# Patient Record
Sex: Female | Born: 1960
Health system: Southern US, Community
[De-identification: ages and names within clinical notes are randomized; demographics above are authoritative.]

## PROBLEM LIST (undated history)

## (undated) DIAGNOSIS — S149XXA Injury of unspecified nerves of neck, initial encounter: Secondary | ICD-10-CM

## (undated) DIAGNOSIS — N200 Calculus of kidney: Secondary | ICD-10-CM

## (undated) DIAGNOSIS — E7800 Pure hypercholesterolemia, unspecified: Secondary | ICD-10-CM

## (undated) DIAGNOSIS — T7840XA Allergy, unspecified, initial encounter: Secondary | ICD-10-CM

## (undated) DIAGNOSIS — M549 Dorsalgia, unspecified: Secondary | ICD-10-CM

## (undated) DIAGNOSIS — I10 Essential (primary) hypertension: Secondary | ICD-10-CM

## (undated) DIAGNOSIS — G589 Mononeuropathy, unspecified: Secondary | ICD-10-CM

## (undated) DIAGNOSIS — D649 Anemia, unspecified: Secondary | ICD-10-CM

## (undated) DIAGNOSIS — F17200 Nicotine dependence, unspecified, uncomplicated: Secondary | ICD-10-CM

## (undated) DIAGNOSIS — M1990 Unspecified osteoarthritis, unspecified site: Secondary | ICD-10-CM

## (undated) DIAGNOSIS — M47816 Spondylosis without myelopathy or radiculopathy, lumbar region: Secondary | ICD-10-CM

## (undated) DIAGNOSIS — G5600 Carpal tunnel syndrome, unspecified upper limb: Secondary | ICD-10-CM

## (undated) HISTORY — DX: Carpal tunnel syndrome, unspecified upper limb: G56.00

## (undated) HISTORY — DX: Pure hypercholesterolemia, unspecified: E78.00

## (undated) HISTORY — DX: Allergy, unspecified, initial encounter: T78.40XA

## (undated) HISTORY — PX: COLONOSCOPY: SHX174

## (undated) HISTORY — DX: Anemia, unspecified: D64.9

## (undated) HISTORY — PX: APPENDECTOMY: SHX54

## (undated) HISTORY — PX: ROTATOR CUFF REPAIR: SHX139

## (undated) HISTORY — PX: POLYPECTOMY: SHX149

---

## 2000-04-15 ENCOUNTER — Other Ambulatory Visit: Admission: RE | Admit: 2000-04-15 | Discharge: 2000-04-15 | Payer: Self-pay | Admitting: *Deleted

## 2000-05-22 ENCOUNTER — Encounter (INDEPENDENT_AMBULATORY_CARE_PROVIDER_SITE_OTHER): Payer: Self-pay | Admitting: Specialist

## 2000-05-22 ENCOUNTER — Other Ambulatory Visit: Admission: RE | Admit: 2000-05-22 | Discharge: 2000-05-22 | Payer: Self-pay | Admitting: *Deleted

## 2000-05-29 ENCOUNTER — Emergency Department (HOSPITAL_COMMUNITY): Admission: EM | Admit: 2000-05-29 | Discharge: 2000-05-29 | Payer: Self-pay | Admitting: Emergency Medicine

## 2000-06-01 ENCOUNTER — Encounter: Admission: RE | Admit: 2000-06-01 | Discharge: 2000-06-01 | Payer: Self-pay | Admitting: Family Medicine

## 2000-06-01 ENCOUNTER — Encounter: Payer: Self-pay | Admitting: Family Medicine

## 2000-07-15 ENCOUNTER — Encounter: Admission: RE | Admit: 2000-07-15 | Discharge: 2000-10-13 | Payer: Self-pay | Admitting: Neurology

## 2000-07-24 ENCOUNTER — Observation Stay (HOSPITAL_COMMUNITY): Admission: RE | Admit: 2000-07-24 | Discharge: 2000-07-25 | Payer: Self-pay | Admitting: Neurosurgery

## 2000-07-24 ENCOUNTER — Encounter: Payer: Self-pay | Admitting: Neurosurgery

## 2000-11-20 ENCOUNTER — Encounter: Payer: Self-pay | Admitting: Orthopedic Surgery

## 2000-11-20 ENCOUNTER — Ambulatory Visit (HOSPITAL_COMMUNITY): Admission: RE | Admit: 2000-11-20 | Discharge: 2000-11-20 | Payer: Self-pay | Admitting: Orthopedic Surgery

## 2002-07-27 ENCOUNTER — Emergency Department (HOSPITAL_COMMUNITY): Admission: EM | Admit: 2002-07-27 | Discharge: 2002-07-27 | Payer: Self-pay | Admitting: Emergency Medicine

## 2003-10-15 ENCOUNTER — Emergency Department (HOSPITAL_COMMUNITY): Admission: EM | Admit: 2003-10-15 | Discharge: 2003-10-15 | Payer: Self-pay | Admitting: Emergency Medicine

## 2003-10-19 ENCOUNTER — Other Ambulatory Visit: Admission: RE | Admit: 2003-10-19 | Discharge: 2003-10-19 | Payer: Self-pay | Admitting: Obstetrics and Gynecology

## 2003-10-24 ENCOUNTER — Encounter: Admission: RE | Admit: 2003-10-24 | Discharge: 2003-10-24 | Payer: Self-pay | Admitting: Obstetrics and Gynecology

## 2003-12-08 ENCOUNTER — Encounter (INDEPENDENT_AMBULATORY_CARE_PROVIDER_SITE_OTHER): Payer: Self-pay | Admitting: *Deleted

## 2003-12-08 ENCOUNTER — Ambulatory Visit (HOSPITAL_COMMUNITY): Admission: RE | Admit: 2003-12-08 | Discharge: 2003-12-08 | Payer: Self-pay | Admitting: Obstetrics and Gynecology

## 2007-01-10 ENCOUNTER — Emergency Department (HOSPITAL_COMMUNITY): Admission: EM | Admit: 2007-01-10 | Discharge: 2007-01-10 | Payer: Self-pay | Admitting: Emergency Medicine

## 2008-09-19 ENCOUNTER — Emergency Department (HOSPITAL_COMMUNITY): Admission: EM | Admit: 2008-09-19 | Discharge: 2008-09-19 | Payer: Self-pay | Admitting: Emergency Medicine

## 2009-10-16 ENCOUNTER — Emergency Department (HOSPITAL_COMMUNITY): Admission: EM | Admit: 2009-10-16 | Discharge: 2009-10-16 | Payer: Self-pay | Admitting: Emergency Medicine

## 2010-06-21 NOTE — Op Note (Signed)
[facility]  Patient:    MARIMAR, SUBER Visit Number: [id number] MRN: [id number]          Service Type: DSU Location: DAY Attending Physician:  Cain Sieve Proc. Date: 11/20/00 Admit Date:  11/20/2000                             Operative Report  PREOPERATIVE DIAGNOSES: 1. 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 2. 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 3. Near full-thickness tear of the rotator cuff with a tear found to    constitute adduction 40% of the thickness of the rotator cuff tendon.  PROCEDURE: 1. Right shoulder glenohumeral joint diagnostic arthroscopy. 2. Debridement of near full-thickness rotator cuff tear and conversion to a    full-thickness rotator cuff tear. 3. Arthroscopic subacromial decompression and bursectomy. 4. Arthroscopic distal clavicle resection. 5. Arthroscopic rotator cuff repair.  SURGEON:  Vania Rea. Supple, M.D.  ANESTHESIA:  General endotracheal as well as a preoperative intrascalene block.  ESTIMATED BLOOD LOSS:  Minimal.  DRAINS:  None.  HISTORY:  Ms. Kaigler is a 50 year old female, who has had persistent difficulties with right shoulder pain as well as loss of motion and weakness with a clinical examination showing markedly positive impingement sign and MRI scan showing hypertrophic arthropathy of the [facility] joint as well as marked tendinosis of the rotator cuff and an apparent partial-thickenss tear.  Due to her ongoing pain and functional limitations, she is brought to the operating room at this time for the above-outlined procedure.  Preoperatively she was counseled on treatment options as well as risks versus benefits thereof.  Possible complications of bleeding, infection, neurovascular injury, persistence of pain, loss of motion, recurrence of rotator cuff tear, and possible need for additional surgery were reviewed. She understands and accepts  and agrees with our plan.  DESCRIPTION OF PROCEDURE:  After undergoing routine preoperative evaluation, the patient had an intrascalene block placed in the holding area by the anesthesia department.  She received 1 g of IV cephalosporin prophylactically. She was placed supine on the operating table and underwent smooth induction of general endotracheal anesthesia.  She was turned to the left lateral decubitus position on the beanbag and appropriately padded and protected.  The right arm was suspended to 70/30 position with 15 pounds of traction.  Examination under anesthesia showed no obvious glenohumeral instability.  Right shoulder girdle region was sterilely prepped and draped in the standard fashion.  The standard posterior portal was established, and the anterior portal was established under direct visualization.  The glenohumeral articular surfaces were in good condition.  There was some very minimal fraying of the labrum which was gently debrided.  The biceps was probed and noted to be well-attached to the superior labrum, and there was no evidence of fraying.  There was evidence for significant partial thickness tear of the articular side of the rotator cuff just posterior to the biceps tendon.  The was aggressively debrided with a shaver to healthy-appearing tissue.  It did appear as though the remaining fibers did have some longitudinal rents through them, and there did appear to be a significant defect in the rotator cuff at this level.  A tag suture was passed of 0 PDS for further evaluation on the bursal side of the rotator cuff at this area.  We completed the inspection of the glenohumeral joint, and this showed no obvious additional pathology.  Fluid and instruments were  then removed and with the arm dropped down to 30 degrees of abduction and the arthroscope introduced into the subacromial space through the posterior portal and direct lateral portal was established in the  subacromial space.  Abundant, proliferative bursal tissue was excised with a combination of the shaver and the Arthrex wand.  The wand was then used to remove the periosteum from the undersurface of the anterior half of the acromion.  Bur was then introduced and used to perform a subacromial decompression, creating a type 4 morphology.  The CA ligament was divided and resected distally with the Arthrex wand used for hemostasis.  A portal was then established directly anterior to the distal clavicle, and a distal clavicle resection was then performed with the bur. Care was taken to make sure that the entire circumference of the distal clavicle could be visualized to ensure adequate removal of bone.  We then carefully inspected the bursal surface of the rotator cuff, and this showed that the area where the tag suture had been placed, showed significant attenuation and thinning on the bursal side, and it was possible to pass a probe completely through this defect.  We decided to proceed with a repair of this region.  The Arthrex wand was then used to remove the residual remnant of the rotator cuff at the greater tuberosity and the adjacent soft tissue.  A bur was introduced and used to create a cancellous bony bed adjacent to the rotator cuff tear.  Through a stab wound off the lateral margin of the acromion, we passed the punch and tap for the Arthrex Bio-Anchor corkscrew suture.  The suture anchor was then placed at the appropriate depth and tension, showing good stability.  The free limbs of the suture anchor were then passed through the free margin rotator cuff, utilizing the Biotek suture retriever.  These parallel limb sutures were then tied, utilizing horizontal mattress suture technique with a sliding arthroscopic knot followed by multiple overhand throws and alternating posts.  This brought the free margin of the rotator cuff down in excellent apposition against the exposed cancellous  bony bed.  We then completed inspection and debridement of the subacromial space.  Fluid and instruments were then all removed.  The portals  were closed with Steri-Strips.  The 4 x 4s and ABDs were then taped about the shoulder.  Her arm was placed in a shoulder immobilizer.  The patient was rolled supine, extubated, and taken to the recovery room in stable condition. Attending Physician:  Cain Sieve DD:  11/20/00 TD:  11/21/00 Job: [id number]

## 2010-06-21 NOTE — H&P (Signed)
[place]  Patient:    Kimberly Sosa, Kimberly Sosa                   MRN: [id number] Adm. Date:  [id number] Attending:  Danella Penton                         History and Physical  HISTORY OF PRESENT ILLNESS:  Kimberly Sosa is a lady who was seen by me in my office a few days ago because of neck pain radiating down to right lower extremity for almost two months.  The pain is getting worse and she has been unable to work.  She is having numbness and tingling which involved the thumb and the hand.  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  She denies any pain over the left upper extremity.  She was sent to [place] after she had an MRI.  PAST MEDICAL HISTORY:  Negative.  ALLERGIES:  TETRACYCLINE.  SOCIAL HISTORY:  She smokes half a pack a day.  She drinks socially.  FAMILY HISTORY:  Mother is 28 with high blood pressure and diabetes.  Father has heart problem.  REVIEW OF SYSTEMS:  Only positive for neck and upper extremity pain.  PHYSICAL EXAMINATION:  HEENT:  Normal.  NECK:  She is able to flex but extensive lateralization produces pain that goes to the right shoulder.  LUNGS:  Clear.  HEART:  Heart sounds normal.  ABDOMEN:  Normal.  EXTREMITIES:  Normal pulses.  NEUROLOGIC:  Mental status normal.  Cranial nerves normal.  Strength is normal except in the right upper extremity where I can break easily to divide biceps and the right wrist extensor.  Sensation normal, although she complains of tingling in the thumb and index finger of the right hand.  Reflexes symmetrical with absent on the right biceps.  Coordination and gait normal.  LABORATORY:  The MRI was seen and I believe this lady had a herniated disk in the foramina at the level of C5-6 on the right side.  CLINICAL IMPRESSION:  Right C5-6 herniated disk.  RECOMMENDATION:  The patient is being  admitted for a posterior approach.  We are going to attempt to use Metrix System to prevent the possibility of pain.  If that is impossible because of her size, then we have to go standard through the midline.  The surgery was fully explained and the risks are emboli, infection, worsening of pain, need for further surgery which might require anterior procedure. DD:  07/24/00 TD:  07/24/00 Job: [id number]

## 2010-06-21 NOTE — Op Note (Signed)
[place]  Patient:    Kimberly Sosa, Kimberly Sosa                   MRN: [id number] Proc. Date: 07/24/00 Adm. Date:  [id number] Disc. Date: [id number] Attending:  Danella Penton                           Operative Report  PREOPERATIVE DIAGNOSIS:  Right C5-6 herniated disk, foraminal.  POSTOPERATIVE DIAGNOSIS:  Right C5-6 herniated disk, foraminal.  PROCEDURE: 1. Right C5, C6 foraminotomies, removal of free fragment. 2. MetRX system. 3. Microscope.  SURGEON:  Tanya Nones. Jeral Fruit, M.D.  ASSISTANTMena Goes. Franky Macho, M.D.  CLINICAL HISTORY:  The patient had been complaining of neck pain radiating to the right upper extremity.  She had weakness of the right biceps.  MRI showed that she has a foraminal herniated disk.  Because there was no displacement of the spinal cord, a decision to go through the posterior approach was made.  Tp knew about the risks, such as were explained in the history and physical.  DESCRIPTION OF PROCEDURE:  The patient was taken to the OR and after intubation, the three pins were applied to the head.  She was positioned in sitting position with 5 degrees forward.  The neck was prepped with Betadine. Then we brought the C-arm and with a needle, we localized the interspace at 5-6.  Then we went about an inch away from the midline.  We made an incision and using dilators, went through this opening until we were able to introduce the retractor.  Indeed, we were at the level of 5-6 by C-arm.  Then we started using the drill and we drilled the lower lamina of 5, the upper of 6, and part of the medial facet.  The yellow ligament was also excised.  Once were removed the yellow ligament with the microscope, we started our dissection.  We found quite a bit of adhesion.  The C6 nerve root was stuck to the spine.  Lysis was achieved and after we had lift the axilla, indeed we found there were like four places of free fragment.  Removal  was accomplished.  We went down with a foraminotomy with plenty of room for the nerve root.  At the end we were able to mobilize the C6 without a problem.  Investigation above the nerve root was negative.  From then on, the area was irrigated.  Gelfoam was left to cover the dural space.  Then the wound was closed with Vicryl and tape.  The patient did well, and she would go to the recovery room. DD:  07/24/00 TD:  07/25/00 Job: [id number]

## 2010-06-21 NOTE — Op Note (Signed)
Kimberly Sosa, Kimberly Sosa            ACCOUNT NO.:  [id number]   MEDICAL RECORD NO.:  [id number]          PATIENT TYPE:  AMB   LOCATION:  SDC                           FACILITY:  [facility]   PHYSICIAN:  Michelle L. Grewal, M.D.DATE OF BIRTH:  1960/04/01   DATE OF PROCEDURE:  12/08/2003  DATE OF DISCHARGE:                                 OPERATIVE REPORT   PREOPERATIVE DIAGNOSES:  Dysfunctional uterine bleeding.   POSTOPERATIVE DIAGNOSES:  Dysfunctional uterine bleeding.   PROCEDURE:  Dilatation and curettage, diagnostic hysteroscopy and  ThermaChoice endometrial ablation.   SURGEON:  Michelle L. Vincente Poli, M.D.   ANESTHESIA:  MAC with paracervical block.   DESCRIPTION OF PROCEDURE:  The patient was taken to the operating room, she  was given sedation and anesthesia and placed in the lithotomy position. She  was prepped and draped in the usual sterile fashion.  An in and out catheter  was used to empty the bladder. The speculum was inserted into the vagina,  the cervix was grasped with a tenaculum and the paracervical block was  performed in the standard fashion.  The uterus was founded to 8 cm in the  midline. The cervical internal os was gently dilated and the diagnostic  hysteroscope was inserted into the uterine cavity.  There was some  visualization, however, there was a large blood clot in the uterus which we  were unable to completely see the endometrium but I did not see any fibroid  or polyp. The hysteroscope was removed and a thorough sharp curettage was  performed of all four walls of the uterus and all tissue was removed from  the uterine cavity. All tissue was sent to pathology. The ThermaChoice 3 was  inserted into the uterine cavity and a ThermaChoice endometrial ablation  performed according to the manufacturer's specifications for an 8 minute  burn period.  At the end of the procedure, the balloon was removed from the  uterus. There was no vaginal bleeding noted. All  instruments removed from  the vagina.  The patient tolerated the procedure well and went to the  recovery room in stable condition.     [place]   MLG/MEDQ  D:  12/08/2003  T:  12/08/2003  Job:  [id number]

## 2010-08-01 ENCOUNTER — Emergency Department (HOSPITAL_COMMUNITY)
Admission: EM | Admit: 2010-08-01 | Discharge: 2010-08-02 | Disposition: A | Discharge status: Home / Self Care | Payer: Self-pay | Attending: Emergency Medicine | Admitting: Emergency Medicine

## 2010-08-01 DIAGNOSIS — R110 Nausea: Secondary | ICD-10-CM | POA: Insufficient documentation

## 2010-08-01 DIAGNOSIS — N201 Calculus of ureter: Secondary | ICD-10-CM | POA: Insufficient documentation

## 2010-08-01 DIAGNOSIS — R109 Unspecified abdominal pain: Secondary | ICD-10-CM | POA: Insufficient documentation

## 2010-08-01 DIAGNOSIS — I10 Essential (primary) hypertension: Secondary | ICD-10-CM | POA: Insufficient documentation

## 2010-08-01 HISTORY — DX: Nicotine dependence, unspecified, uncomplicated: F17.200

## 2010-08-01 HISTORY — DX: Essential (primary) hypertension: I10

## 2010-08-01 LAB — URINE MICROSCOPIC-ADD ON

## 2010-08-01 LAB — URINALYSIS, ROUTINE W REFLEX MICROSCOPIC
Glucose, UA: NEGATIVE mg/dL
Protein, ur: NEGATIVE mg/dL

## 2010-08-02 ENCOUNTER — Emergency Department (HOSPITAL_COMMUNITY): Payer: Self-pay

## 2010-08-02 ENCOUNTER — Encounter (HOSPITAL_COMMUNITY): Payer: Self-pay | Admitting: Radiology

## 2010-11-11 LAB — URINALYSIS, ROUTINE W REFLEX MICROSCOPIC
Bilirubin Urine: NEGATIVE
Ketones, ur: NEGATIVE
Nitrite: NEGATIVE
Protein, ur: NEGATIVE
Specific Gravity, Urine: 1.009
Urobilinogen, UA: 0.2

## 2010-11-11 LAB — URINE MICROSCOPIC-ADD ON

## 2010-11-11 LAB — PREGNANCY, URINE: Preg Test, Ur: NEGATIVE

## 2011-06-08 ENCOUNTER — Emergency Department (HOSPITAL_COMMUNITY)
Admission: EM | Admit: 2011-06-08 | Discharge: 2011-06-08 | Disposition: A | Discharge status: Home / Self Care | Payer: Self-pay | Attending: Emergency Medicine | Admitting: Emergency Medicine

## 2011-06-08 ENCOUNTER — Encounter (HOSPITAL_COMMUNITY): Payer: Self-pay | Admitting: Emergency Medicine

## 2011-06-08 DIAGNOSIS — M549 Dorsalgia, unspecified: Secondary | ICD-10-CM

## 2011-06-08 DIAGNOSIS — M545 Low back pain, unspecified: Secondary | ICD-10-CM | POA: Insufficient documentation

## 2011-06-08 DIAGNOSIS — M546 Pain in thoracic spine: Secondary | ICD-10-CM | POA: Insufficient documentation

## 2011-06-08 DIAGNOSIS — M62838 Other muscle spasm: Secondary | ICD-10-CM | POA: Insufficient documentation

## 2011-06-08 DIAGNOSIS — I10 Essential (primary) hypertension: Secondary | ICD-10-CM | POA: Insufficient documentation

## 2011-06-08 HISTORY — DX: Dorsalgia, unspecified: M54.9

## 2011-06-08 HISTORY — DX: Calculus of kidney: N20.0

## 2011-06-08 MED ORDER — IBUPROFEN 800 MG PO TABS: 800.0000 mg | ORAL_TABLET | Freq: Three times a day (TID) | ORAL | Status: AC | PRN

## 2011-06-08 MED ORDER — TRAMADOL HCL 50 MG PO TABS: 50.0000 mg | ORAL_TABLET | Freq: Four times a day (QID) | ORAL | Status: DC | PRN

## 2011-06-08 MED ORDER — CYCLOBENZAPRINE HCL 10 MG PO TABS
10.0000 mg | ORAL_TABLET | Freq: Once | ORAL | Status: AC
  Administered 2011-06-08: 10 mg via ORAL
  Filled 2011-06-08: qty 1

## 2011-06-08 MED ORDER — IBUPROFEN 800 MG PO TABS
800.0000 mg | ORAL_TABLET | Freq: Once | ORAL | Status: AC
  Administered 2011-06-08: 800 mg via ORAL
  Filled 2011-06-08: qty 1

## 2011-06-08 MED ORDER — IBUPROFEN 800 MG PO TABS: 800.0000 mg | ORAL_TABLET | Freq: Three times a day (TID) | ORAL | Status: DC | PRN

## 2011-06-08 MED ORDER — TRAMADOL HCL 50 MG PO TABS: 50.0000 mg | ORAL_TABLET | Freq: Four times a day (QID) | ORAL | Status: AC | PRN

## 2011-06-08 MED ORDER — METHOCARBAMOL 500 MG PO TABS: 1000.0000 mg | ORAL_TABLET | Freq: Four times a day (QID) | ORAL | Status: DC

## 2011-06-08 MED ORDER — METHOCARBAMOL 500 MG PO TABS: 1000.0000 mg | ORAL_TABLET | Freq: Four times a day (QID) | ORAL | Status: AC

## 2011-06-08 NOTE — ED Provider Notes (Signed)
Medical screening examination/treatment/procedure(s) were performed by non-physician practitioner and as supervising physician I was immediately available for consultation/collaboration.    Becker Christopher R Donnavan Covault, MD 06/08/11 1758 

## 2011-06-08 NOTE — ED Notes (Signed)
Pt reports mid back pain, denies acute injury. Pain travels from mid to low back. Denies flank pain

## 2011-06-08 NOTE — ED Provider Notes (Signed)
History     CSN: [id number]  Arrival date & time 06/08/11  1051   First MD Initiated Contact with Patient 06/08/11 1056      Chief Complaint  Patient presents with  . Back Pain    Pt reports 24 hx of back pain, unresponsive to OTC meds    (Consider location/radiation/quality/duration/timing/severity/associated sxs/prior treatment) HPI Comments: Patient with complaint of L middle and lower back stiffness since yesterday. No acute injury. No red flag signs and symptoms of lower back pain. Patient has been using ibuprofen without relief. Pain does not radiate. Pain is described as an ache.  Patient is a 51 y.o. female presenting with back pain. The history is provided by the patient.  Back Pain  This is a new problem. The current episode started yesterday. The problem occurs constantly. The problem has not changed since onset.The pain is associated with no known injury. The pain is present in the lumbar spine and thoracic spine. The quality of the pain is described as aching. The pain does not radiate. The pain is moderate. The symptoms are aggravated by twisting, certain positions and bending. Pertinent negatives include no fever, no numbness, no weight loss, no bowel incontinence, no perianal numbness, no bladder incontinence, no dysuria, no paresthesias and no weakness. She has tried NSAIDs for the symptoms. The treatment provided no relief.    Past Medical History  Diagnosis Date  . Hypertension   . Active smoker   . Back pain   . Kidney stone     Past Surgical History  Procedure Date  . Rotator cuff repair     Family History  Problem Relation Age of Onset  . Cancer Mother   . Diabetes Mother   . Hypertension Mother     History  Substance Use Topics  . Smoking status: Current Everyday Smoker    Types: Cigarettes  . Smokeless tobacco: Not on file  . Alcohol Use: Yes    OB History    Grav Para Term Preterm Abortions TAB SAB Ect Mult Living                   Review of Systems  Constitutional: Negative for fever, weight loss and unexpected weight change.  Gastrointestinal: Negative for constipation and bowel incontinence.       Negative for fecal incontinence.   Genitourinary: Negative for bladder incontinence, dysuria, hematuria and flank pain.       Negative for urinary incontinence or retention.  Musculoskeletal: Positive for back pain.  Neurological: Negative for weakness, numbness and paresthesias.       Denies saddle paresthesias.    Allergies  Tetracyclines & related  Home Medications   Current Outpatient Rx  Name Route Sig Dispense Refill  . IBUPROFEN 200 MG PO TABS Oral Take 200 mg by mouth every 6 (six) hours as needed.    . IBUPROFEN 800 MG PO TABS Oral Take 1 tablet (800 mg total) by mouth every 8 (eight) hours as needed for pain. 15 tablet 0  . METHOCARBAMOL 500 MG PO TABS Oral Take 2 tablets (1,000 mg total) by mouth 4 (four) times daily. 20 tablet 0  . TRAMADOL HCL 50 MG PO TABS Oral Take 1 tablet (50 mg total) by mouth every 6 (six) hours as needed for pain. 15 tablet 0    BP 126/82  Pulse 77  Temp(Src) 97.8 F (36.6 C) (Oral)  Resp 20  SpO2 100%  Physical Exam  Nursing note and vitals reviewed. Constitutional:  She is oriented to person, place, and time. She appears well-developed and well-nourished.  HENT:  Head: Normocephalic and atraumatic.  Eyes: Conjunctivae are normal.  Neck: Normal range of motion. Neck supple.  Pulmonary/Chest: Effort normal.  Abdominal: Soft. There is no tenderness. There is no CVA tenderness.  Musculoskeletal: Normal range of motion.       There is no tenderness to palpation over cervical/thoracic/lumbar/sacral spine. Tenderness to palpation and palpable spasm over thoracic/lumbar paraspinal muscles. No step-off noted with palpation of spine.   Neurological: She is alert and oriented to person, place, and time. She has normal strength and normal reflexes. No sensory deficit. Gait  normal.       5/5 strength in entire lower extremities bilaterally. No sensation deficit.   Skin: Skin is warm and dry. No rash noted.  Psychiatric: She has a normal mood and affect.    ED Course  Procedures (including critical care time)  Labs Reviewed - No data to display No results found.   1. Muscle spasm   2. Back pain    11:41 AM Patient seen and examined.   Vital signs reviewed and are as follows: Filed Vitals:   06/08/11 1057  BP: 126/82  Pulse: 77  Temp: 97.8 F (36.6 C)  Resp: 20   No red flag s/s of low back pain. There is palpable muscle spasm. Patient was counseled on back pain precautions and told to do activity as tolerated but do not lift, push, or pull heavy objects more than 10 pounds for the next week.  Patient counseled to use ice or heat on back for no longer than 15 minutes every hour.   Patient prescribed muscle relaxer and counseled on proper use of muscle relaxant medication.    Patient prescribed narcotic pain medicine and counseled on proper use of narcotic pain medications. Counseled not to combine this medication with others containing tylenol.   Urged patient not to drink alcohol, drive, or perform any other activities that requires focus while taking either of these medications.  Patient urged to follow-up with PCP if pain does not improve with treatment and rest or if pain becomes recurrent. Urged to return with worsening severe pain, loss of bowel or bladder control, trouble walking.   The patient verbalizes understanding and agrees with the plan.  MDM  Patient with back pain, palpable muscle spasm. No neurological deficits. Patient is ambulatory. No warning symptoms of back pain including: loss of bowel or bladder control, night sweats, waking from sleep with back pain, unexplained fevers or weight loss, h/o cancer, IVDU, recent trauma. No concern for cauda equina, epidural abscess, or other serious cause of back pain. Conservative measures  such as rest, ice/heat and pain medicine indicated with PCP follow-up if no improvement with conservative management.          Renne Crigler, [place] 06/08/11 1155

## 2015-08-28 ENCOUNTER — Encounter (HOSPITAL_COMMUNITY): Payer: Self-pay

## 2015-08-28 ENCOUNTER — Emergency Department (HOSPITAL_COMMUNITY)
Admission: EM | Admit: 2015-08-28 | Discharge: 2015-08-28 | Disposition: A | Discharge status: Home / Self Care | Payer: BLUE CROSS/BLUE SHIELD | Attending: Emergency Medicine | Admitting: Emergency Medicine

## 2015-08-28 ENCOUNTER — Emergency Department (HOSPITAL_COMMUNITY): Payer: BLUE CROSS/BLUE SHIELD

## 2015-08-28 DIAGNOSIS — F17210 Nicotine dependence, cigarettes, uncomplicated: Secondary | ICD-10-CM | POA: Diagnosis not present

## 2015-08-28 DIAGNOSIS — Y999 Unspecified external cause status: Secondary | ICD-10-CM | POA: Diagnosis not present

## 2015-08-28 DIAGNOSIS — S63614A Unspecified sprain of right ring finger, initial encounter: Secondary | ICD-10-CM | POA: Diagnosis not present

## 2015-08-28 DIAGNOSIS — Y929 Unspecified place or not applicable: Secondary | ICD-10-CM | POA: Insufficient documentation

## 2015-08-28 DIAGNOSIS — Y939 Activity, unspecified: Secondary | ICD-10-CM | POA: Diagnosis not present

## 2015-08-28 DIAGNOSIS — S6991XA Unspecified injury of right wrist, hand and finger(s), initial encounter: Secondary | ICD-10-CM | POA: Diagnosis present

## 2015-08-28 DIAGNOSIS — W230XXA Caught, crushed, jammed, or pinched between moving objects, initial encounter: Secondary | ICD-10-CM | POA: Insufficient documentation

## 2015-08-28 DIAGNOSIS — I10 Essential (primary) hypertension: Secondary | ICD-10-CM | POA: Insufficient documentation

## 2015-08-28 DIAGNOSIS — S63619A Unspecified sprain of unspecified finger, initial encounter: Secondary | ICD-10-CM

## 2015-08-28 NOTE — ED Provider Notes (Signed)
[facility] DEPT Provider Note   CSN: [id number] Arrival date & time: 08/28/15  1444  First Provider Contact:  None    By signing my name below, I, Hansel Feinstein, attest that this documentation has been prepared under the direction and in the presence of Montine Circle, PA-C. Electronically Signed: Hansel Feinstein, ED Scribe. 08/28/15. 3:40 PM.    History   Chief Complaint Chief Complaint  Patient presents with  . Finger Injury    HPI Kimberly Sosa is a 55 y.o. female who presents to the Emergency Department complaining of moderate right ring finger pain and swelling s/p injury that occurred last night. Pt states that she was getting into her car when her right hand slipped downward and her ring finger got stuck into a circular metal cut out on the car door, causing her injury. Pt states her pain is worsened with finger movement. She reports mild relief with ice application. She denies numbness, additional injuries.   The history is provided by the patient. No language interpreter was used.    Past Medical History:  Diagnosis Date  . Active smoker   . Back pain   . Hypertension   . Kidney stone     There are no active problems to display for this patient.   Past Surgical History:  Procedure Laterality Date  . ROTATOR CUFF REPAIR      OB History    No data available       Home Medications    Prior to Admission medications   Medication Sig Start Date End Date Taking? Authorizing Provider  ibuprofen (ADVIL,MOTRIN) 200 MG tablet Take 200 mg by mouth every 6 (six) hours as needed.    Historical Provider, MD    Family History Family History  Problem Relation Age of Onset  . Cancer Mother   . Diabetes Mother   . Hypertension Mother     Social History Social History  Substance Use Topics  . Smoking status: Current Every Day Smoker    Types: Cigarettes  . Smokeless tobacco: Never Used  . Alcohol use Yes     Comment: occasional      Allergies     Tetracyclines & related   Review of Systems Review of Systems  Musculoskeletal: Positive for arthralgias (right ring finger ) and joint swelling (right ring finger ).  Neurological: Negative for numbness.     Physical Exam Updated Vital Signs BP 134/70 (BP Location: Left Arm)   Pulse 95   Temp 98.3 F (36.8 C) (Oral)   Resp 18   SpO2 100%   Physical Exam Physical Exam  Constitutional: Pt appears well-developed and well-nourished. No distress.  HENT:  Head: Normocephalic and atraumatic.  Eyes: Conjunctivae are normal.  Neck: Normal range of motion.  Cardiovascular: Normal rate, regular rhythm and intact distal pulses.   Capillary refill < 3 sec  Pulmonary/Chest: Effort normal and breath sounds normal.  Musculoskeletal: Pt exhibits tenderness to palpation over the right 4th PIP. No bony abnormality or deformity. Mild swelling.  ROM: right ring finger 4/5 limited by pain  Neurological: Pt  is alert. Coordination normal.  Sensation 5/5 Strength 4/5 limited by pain- right ring finger   Skin: Skin is warm and dry. Pt is not diaphoretic.  No tenting of the skin  Psychiatric: Pt has a normal mood and affect.  Nursing note and vitals reviewed.   ED Treatments / Results  Labs (all labs ordered are listed, but only abnormal results are displayed) Labs  Reviewed - No data to display  EKG  EKG Interpretation None       Radiology Dg Finger Ring Right  Result Date: 08/28/2015 CLINICAL DATA:  Injury in car door last night. EXAM: RIGHT RING FINGER 2+V COMPARISON:  None. FINDINGS: There is no evidence of fracture or dislocation. There is no evidence of arthropathy or other focal bone abnormality. Soft tissues are unremarkable. IMPRESSION: Negative Electronically Signed   By: Nelson Chimes M.D.   On: 08/28/2015 15:35   Procedures Procedures (including critical care time)  DIAGNOSTIC STUDIES: Oxygen Saturation is 100% on RA, normal by my interpretation.    COORDINATION OF  CARE: 3:36 PM Discussed treatment plan with pt at bedside which includes XR, finger splint and pt agreed to plan.    Medications Ordered in ED Medications - No data to display   Initial Impression / Assessment and Plan / ED Course  I have reviewed the triage vital signs and the nursing notes.  Pertinent labs & imaging results that were available during my care of the patient were reviewed by me and considered in my medical decision making (see chart for details).  Clinical Course    Patient with right finger injury.  Plain films are negative. Likely sprained.  Finger splint and PCP follow-up.  Final Clinical Impressions(s) / ED Diagnoses   Final diagnoses:  Finger sprain, initial encounter    New Prescriptions New Prescriptions   No medications on file    I personally performed the services described in this documentation, which was scribed in my presence. The recorded information has been reviewed and is accurate.      Montine Circle, PA-C 08/28/15 [place], MD 09/01/15 [phone]

## 2015-08-28 NOTE — ED Triage Notes (Signed)
Pt reports her right ring finger got caught in the latch of her car door. Pt reports limited movement of the finger.

## 2015-08-28 NOTE — ED Notes (Signed)
Right ring finger caught in car door handle last pm. C/o pain in knuckle.

## 2015-10-24 ENCOUNTER — Encounter (HOSPITAL_COMMUNITY): Payer: BLUE CROSS/BLUE SHIELD

## 2015-10-30 ENCOUNTER — Inpatient Hospital Stay (HOSPITAL_COMMUNITY)
Admission: RE | Admit: 2015-10-30 | Payer: BLUE CROSS/BLUE SHIELD | Source: Ambulatory Visit | Admitting: Orthopedic Surgery

## 2015-10-30 ENCOUNTER — Encounter (HOSPITAL_COMMUNITY): Admission: RE | Payer: Self-pay | Source: Ambulatory Visit

## 2015-10-30 SURGERY — ARTHROPLASTY, HIP, TOTAL, ANTERIOR APPROACH
Anesthesia: Choice | Site: Hip | Laterality: Left

## 2016-03-20 ENCOUNTER — Ambulatory Visit: Payer: Medicaid Other | Attending: Family Medicine | Admitting: Family Medicine

## 2016-03-20 ENCOUNTER — Encounter: Payer: Self-pay | Admitting: Family Medicine

## 2016-03-20 VITALS — BP 145/78 | HR 83 | Temp 97.9°F | Resp 18 | Ht 67.0 in | Wt 224.4 lb

## 2016-03-20 DIAGNOSIS — M25552 Pain in left hip: Secondary | ICD-10-CM | POA: Diagnosis not present

## 2016-03-20 DIAGNOSIS — I10 Essential (primary) hypertension: Secondary | ICD-10-CM | POA: Insufficient documentation

## 2016-03-20 DIAGNOSIS — M62838 Other muscle spasm: Secondary | ICD-10-CM

## 2016-03-20 DIAGNOSIS — M25551 Pain in right hip: Secondary | ICD-10-CM | POA: Diagnosis not present

## 2016-03-20 LAB — BASIC METABOLIC PANEL WITH GFR
BUN: 19 mg/dL (ref 7–25)
CALCIUM: 9.4 mg/dL (ref 8.6–10.4)
CO2: 23 mmol/L (ref 20–31)
CREATININE: 0.83 mg/dL (ref 0.50–1.05)
Chloride: 110 mmol/L (ref 98–110)
GFR, EST NON AFRICAN AMERICAN: 80 mL/min (ref >=60)
Glucose, Bld: 71 mg/dL (ref 65–99)
Potassium: 3.8 mmol/L (ref 3.5–5.3)
SODIUM: 140 mmol/L (ref 135–146)

## 2016-03-20 MED ORDER — LISINOPRIL-HYDROCHLOROTHIAZIDE 10-12.5 MG PO TABS: 1.0000 | ORAL_TABLET | Freq: Every day | ORAL | 2 refills | Status: DC

## 2016-03-20 MED ORDER — CLONIDINE HCL 0.1 MG PO TABS
0.1000 mg | ORAL_TABLET | Freq: Once | ORAL | Status: AC
  Administered 2016-03-20: 0.1 mg via ORAL

## 2016-03-20 MED ORDER — TRAMADOL HCL 50 MG PO TABS: 50.0000 mg | ORAL_TABLET | Freq: Four times a day (QID) | ORAL | 0 refills | Status: DC | PRN

## 2016-03-20 MED ORDER — CYCLOBENZAPRINE HCL 10 MG PO TABS: 10.0000 mg | ORAL_TABLET | Freq: Three times a day (TID) | ORAL | 0 refills | Status: DC | PRN

## 2016-03-20 MED FILL — LISINOPRIL-HCTZ 10-12.5 MG: 10-12.5 | 30 days supply | Qty: 30 | Fill #0

## 2016-03-20 MED FILL — CYCLOBENZAPRINE 10 MG TAB: 10 | 10 days supply | Qty: 30 | Fill #0

## 2016-03-20 NOTE — Progress Notes (Signed)
Subjective:  Patient ID: Kimberly Sosa, female    DOB: July 04, 1960  Age: 56 y.o. MRN: [id number]  CC: Establish Care   HPI [name] presents to establish care. History of chronic bilateral hip pain,  left greater than right. She reports it has been recommended that she have surgery. She is requesting a referral to orthopedic. She reports difficulty weight bearing with occasional muscle spasms. She ambulates with a cane.  Outpatient Medications Prior to Visit  Medication Sig Dispense Refill  . ibuprofen (ADVIL,MOTRIN) 200 MG tablet Take 200 mg by mouth every 6 (six) hours as needed.     No facility-administered medications prior to visit.    ROS Review of Systems  Respiratory: Negative.   Cardiovascular: Negative.   Musculoskeletal: Positive for arthralgias and myalgias.   Objective:  BP (!) 145/78   Pulse 83   Temp 97.9 F (36.6 C) (Oral)   Resp 18   Ht 5\' 7"  (1.702 m)   Wt 224 lb 6.4 oz (101.8 kg)   SpO2 99%   BMI 35.15 kg/m   BP/Weight 03/20/2016 [id number] Q000111Q  Systolic BP Q000111Q Q000111Q Q000111Q  Diastolic BP 78 70 73  Wt. (Lbs) 224.4 - -  BMI 35.15 - -   Physical Exam  Cardiovascular: Normal rate, regular rhythm, normal heart sounds and intact distal pulses.   Pulmonary/Chest: Effort normal and breath sounds normal.  Abdominal: Soft. Bowel sounds are normal.  Musculoskeletal:       Right hip: She exhibits decreased range of motion.       Left hip: She exhibits decreased range of motion.  Bilateral hip pain.  Nursing note and vitals reviewed.  Assessment & Plan:   Problem List Items Addressed This Visit      Cardiovascular and Mediastinum   Hypertension   Relevant Medications   lisinopril-hydrochlorothiazide (PRINZIDE,ZESTORETIC) 10-12.5 MG tablet   cloNIDine (CATAPRES) tablet 0.1 mg (Completed)   lisinopril-hydrochlorothiazide (PRINZIDE,ZESTORETIC) 10-12.5 MG tablet   Other Relevant Orders   BASIC METABOLIC PANEL WITH GFR (Completed)   Microalbumin/Creatinine Ratio, Urine (Completed)    Other Visit Diagnoses    Bilateral hip pain    -  Primary   Relevant Medications   traMADol (ULTRAM) 50 MG tablet   Other Relevant Orders   Ambulatory referral to Orthopedics   Muscle spasm       Relevant Medications   cyclobenzaprine (FLEXERIL) 10 MG tablet      Meds ordered this encounter  Medications  . cloNIDine (CATAPRES) tablet 0.1 mg  . lisinopril-hydrochlorothiazide (PRINZIDE,ZESTORETIC) 10-12.5 MG tablet    Sig: Take 1 tablet by mouth daily.    Dispense:  30 tablet    Refill:  2    Order Specific Question:   Supervising Provider    Answer:   Tresa Garter [id number]  . traMADol (ULTRAM) 50 MG tablet    Sig: Take 1 tablet (50 mg total) by mouth every 6 (six) hours as needed for moderate pain or severe pain.    Dispense:  30 tablet    Refill:  0    Order Specific Question:   Supervising Provider    Answer:   Tresa Garter [id number]  . cyclobenzaprine (FLEXERIL) 10 MG tablet    Sig: Take 1 tablet (10 mg total) by mouth 3 (three) times daily as needed for muscle spasms.    Dispense:  30 tablet    Refill:  0    Order Specific Question:   Supervising Provider  AnswerTresa Garter [id number]    Follow-up: Return in about 2 weeks (around 04/03/2016) for BP check with Stacy in 2 weeks & Hypertension follow up in 3 months. Alfonse Spruce FNP

## 2016-03-20 NOTE — Patient Instructions (Addendum)
Come back for BP check in 2 weeks with clinical pharmacist. Follow up in 3 months for Hypertension.   Hypertension Hypertension is another name for high blood pressure. High blood pressure forces your heart to work harder to pump blood. A blood pressure reading has two numbers, which includes a higher number over a lower number (example: 110/72). Follow these instructions at home:  Have your blood pressure rechecked by your doctor.  Only take medicine as told by your doctor. Follow the directions carefully. The medicine does not work as well if you skip doses. Skipping doses also puts you at risk for problems.  Do not smoke.  Monitor your blood pressure at home as told by your doctor. Contact a doctor if:  You think you are having a reaction to the medicine you are taking.  You have repeat headaches or feel dizzy.  You have puffiness (swelling) in your ankles.  You have trouble with your vision. Get help right away if:  You get a very bad headache and are confused.  You feel weak, numb, or faint.  You get chest or belly (abdominal) pain.  You throw up (vomit).  You cannot breathe very well. This information is not intended to replace advice given to you by your health care provider. Make sure you discuss any questions you have with your health care provider. Document Released: 07/09/2007 Document Revised: 06/28/2015 Document Reviewed: 11/12/2012 Elsevier Interactive Patient Education  2017 [employer].

## 2016-03-20 NOTE — Progress Notes (Signed)
Patient is here for establish care  Patient needs refill on lisinopril  Patient needs referral to the orthopedics   Patient has hip pain  Patient decline the flu shot today  Patient has eaten today  patient [place]

## 2016-03-21 ENCOUNTER — Telehealth: Payer: Self-pay | Admitting: Family Medicine

## 2016-03-21 LAB — MICROALBUMIN / CREATININE URINE RATIO
CREATININE, URINE: 150 mg/dL (ref 20–320)
MICROALB/CREAT RATIO: 101 ug/mg{creat} — AB (ref <30)
Microalb, Ur: 15.1 mg/dL

## 2016-03-21 NOTE — Telephone Encounter (Signed)
Ok thanks 

## 2016-03-21 NOTE — Telephone Encounter (Signed)
Pt dropped off form for referral to [facility] for preoperative clearance  [employer] is requesting form be faxed back to [employer] at [phone]  Paperwork placed in providers box

## 2016-03-28 NOTE — H&P (Signed)
TOTAL HIP ADMISSION H&P  Patient is admitted for left total hip arthroplasty, anterior approach.  Subjective:  Chief Complaint:    Left hip primary OA / pain  HPI: Kimberly Sosa, 56 y.o. female, has a history of pain and functional disability in the left hip(s) due to arthritis and patient has failed non-surgical conservative treatments for greater than 12 weeks to include NSAID's and/or analgesics, corticosteriod injections, use of assistive devices and activity modification.  Onset of symptoms was gradual starting 2+ years ago with gradually worsening course since that time.The patient noted no past surgery on the left hip(s).  Patient currently rates pain in the left hip at 10 out of 10 with activity. Patient has night pain, worsening of pain with activity and weight bearing, trendelenberg gait, pain that interfers with activities of daily living and pain with passive range of motion. Patient has evidence of periarticular osteophytes and joint space narrowing by imaging studies. This condition presents safety issues increasing the risk of falls.  There is no current active infection.   Risks, benefits and expectations were discussed with the patient.  Risks including but not limited to the risk of anesthesia, blood clots, nerve damage, blood vessel damage, failure of the prosthesis, infection and up to and including death.  Patient understand the risks, benefits and expectations and wishes to proceed with surgery.   PCP: Fredia Beets, FNP  D/C Plans:       Home   Post-op Meds:       No Rx given   Tranexamic Acid:      To be given - IV   Decadron:      Is to be given  FYI:     ASA  Oxycodone (rxn to [facility])   PT:  No PT  DME: Will obtain RW from her sister    Patient Active Problem List   Diagnosis Date Noted  . Hypertension 03/20/2016   Past Medical History:  Diagnosis Date  . Active smoker   . Back pain   . Hypertension   . Kidney stone     Past Surgical History:   Procedure Laterality Date  . ROTATOR CUFF REPAIR      No prescriptions prior to admission.   Allergies  Allergen Reactions  . Hydrocodone Other (See Comments)    Causes Headaches  . Mobic [Meloxicam] Other (See Comments)    Causes Headaches  . Tetracyclines & Related Swelling    Social History  Substance Use Topics  . Smoking status: Current Every Day Smoker    Types: Cigarettes  . Smokeless tobacco: Never Used  . Alcohol use Yes     Comment: occasional     Family History  Problem Relation Age of Onset  . Cancer Mother   . Diabetes Mother   . Hypertension Mother      Review of Systems  Constitutional: Negative.   HENT: Negative.   Eyes: Negative.   Respiratory: Negative.   Cardiovascular: Negative.   Gastrointestinal: Negative.   Genitourinary: Negative.   Musculoskeletal: Positive for back pain and joint pain.  Skin: Negative.   Neurological: Negative.   Endo/Heme/Allergies: Negative.   Psychiatric/Behavioral: Negative.     Objective:  Physical Exam  Constitutional: She is oriented to person, place, and time. She appears well-developed.  HENT:  Head: Normocephalic.  Eyes: Pupils are equal, round, and reactive to light.  Neck: Neck supple. No JVD present. No tracheal deviation present. No thyromegaly present.  Cardiovascular: Normal rate, regular rhythm, normal  heart sounds and intact distal pulses.   Respiratory: Effort normal and breath sounds normal. No respiratory distress. She has no wheezes.  GI: Soft. There is no tenderness. There is no guarding.  Musculoskeletal:       Left hip: She exhibits decreased range of motion, decreased strength, tenderness and bony tenderness. She exhibits no swelling, no deformity and no laceration.  Lymphadenopathy:    She has no cervical adenopathy.  Neurological: She is alert and oriented to person, place, and time.  Skin: Skin is warm and dry.  Psychiatric: She has a normal mood and affect.       Labs:  Estimated body mass index is 35.15 kg/m as calculated from the following:   Height as of 03/20/16: 5\' 7"  (1.702 m).   Weight as of 03/20/16: 101.8 kg (224 lb 6.4 oz).   Imaging Review Plain radiographs demonstrate severe degenerative joint disease of the left hip(s). The bone quality appears to be good for age and reported activity level.  Assessment/Plan:  End stage arthritis, left hip(s)  The patient history, physical examination, clinical judgement of the provider and imaging studies are consistent with end stage degenerative joint disease of the left hip(s) and total hip arthroplasty is deemed medically necessary. The treatment options including medical management, injection therapy, arthroscopy and arthroplasty were discussed at length. The risks and benefits of total hip arthroplasty were presented and reviewed. The risks due to aseptic loosening, infection, stiffness, dislocation/subluxation,  thromboembolic complications and other imponderables were discussed.  The patient acknowledged the explanation, agreed to proceed with the plan and consent was signed. Patient is being admitted for inpatient treatment for surgery, pain control, PT, OT, prophylactic antibiotics, VTE prophylaxis, progressive ambulation and ADL's and discharge planning.The patient is planning to be discharged home.     [place] Shivaan Tierno   PA-C  03/28/2016, 11:30 AM

## 2016-03-31 NOTE — Patient Instructions (Signed)
Kimberly Sosa  03/31/2016   Your procedure is scheduled on: 04-08-16  Report to [facility] Main  Entrance take [facility]  elevators to 3rd floor to  [place] at 6AM.  Call this number if you have problems the morning of surgery [phone]   Remember: ONLY 1 PERSON MAY GO WITH YOU TO SHORT STAY TO GET  READY MORNING OF [place].  Do not eat food or drink liquids :After Midnight.     Take these medicines the morning of surgery with A SIP OF WATER: tylenol as needed, tramadol as needed                                You may not have any metal on your body including hair pins and              piercings  Do not wear jewelry, make-up, lotions, powders or perfumes, deodorant             Do not wear nail polish.  Do not shave  48 hours prior to surgery.              Men may shave face and neck.   Do not bring valuables to the hospital. [place].  Contacts, dentures or bridgework may not be worn into surgery.  Leave suitcase in the car. After surgery it may be brought to your room.               Please read over the following fact sheets you were given: _____________________________________________________________________             [name] - Preparing for Surgery Before surgery, you can play an important role.  Because skin is not sterile, your skin needs to be as free of germs as possible.  You can reduce the number of germs on your skin by washing with CHG (chlorahexidine gluconate) soap before surgery.  CHG is an antiseptic cleaner which kills germs and bonds with the skin to continue killing germs even after washing. Please DO NOT use if you have an allergy to CHG or antibacterial soaps.  If your skin becomes reddened/irritated stop using the CHG and inform your nurse when you arrive at Short Stay. Do not shave (including legs and underarms) for at least 48 hours prior to the first CHG shower.   You may shave your face/neck. Please follow these instructions carefully:  1.  Shower with CHG Soap the night before surgery and the  morning of Surgery.  2.  If you choose to wash your hair, wash your hair first as usual with your  normal  shampoo.  3.  After you shampoo, rinse your hair and body thoroughly to remove the  shampoo.                           4.  Use CHG as you would any other liquid soap.  You can apply chg directly  to the skin and wash                       Gently with a scrungie or clean washcloth.  5.  Apply the CHG  Soap to your body ONLY FROM THE NECK DOWN.   Do not use on face/ open                           Wound or open sores. Avoid contact with eyes, ears mouth and genitals (private parts).                       Wash face,  Genitals (private parts) with your normal soap.             6.  Wash thoroughly, paying special attention to the area where your surgery  will be performed.  7.  Thoroughly rinse your body with warm water from the neck down.  8.  DO NOT shower/wash with your normal soap after using and rinsing off  the CHG Soap.                9.  Pat yourself dry with a clean towel.            10.  Wear clean pajamas.            11.  Place clean sheets on your bed the night of your first shower and do not  sleep with pets. Day of Surgery : Do not apply any lotions/deodorants the morning of surgery.  Please wear clean clothes to the hospital/surgery center.  FAILURE TO FOLLOW THESE INSTRUCTIONS MAY RESULT IN THE CANCELLATION OF YOUR SURGERY PATIENT SIGNATURE_________________________________  NURSE SIGNATURE__________________________________  ________________________________________________________________________   Adam Phenix  An incentive spirometer is a tool that can help keep your lungs clear and active. This tool measures how well you are filling your lungs with each breath. Taking long deep breaths may help reverse or decrease the chance of  developing breathing (pulmonary) problems (especially infection) following:  A long period of time when you are unable to move or be active. BEFORE THE PROCEDURE   If the spirometer includes an indicator to show your best effort, your nurse or respiratory therapist will set it to a desired goal.  If possible, sit up straight or lean slightly forward. Try not to slouch.  Hold the incentive spirometer in an upright position. INSTRUCTIONS FOR USE  1. Sit on the edge of your bed if possible, or sit up as far as you can in bed or on a chair. 2. Hold the incentive spirometer in an upright position. 3. Breathe out normally. 4. Place the mouthpiece in your mouth and seal your lips tightly around it. 5. Breathe in slowly and as deeply as possible, raising the piston or the ball toward the top of the column. 6. Hold your breath for 3-5 seconds or for as long as possible. Allow the piston or ball to fall to the bottom of the column. 7. Remove the mouthpiece from your mouth and breathe out normally. 8. Rest for a few seconds and repeat Steps 1 through 7 at least 10 times every 1-2 hours when you are awake. Take your time and take a few normal breaths between deep breaths. 9. The spirometer may include an indicator to show your best effort. Use the indicator as a goal to work toward during each repetition. 10. After each set of 10 deep breaths, practice coughing to be sure your lungs are clear. If you have an incision (the cut made at the time of surgery), support your incision when coughing by placing a pillow or rolled up towels  firmly against it. Once you are able to get out of bed, walk around indoors and cough well. You may stop using the incentive spirometer when instructed by your caregiver.  RISKS AND COMPLICATIONS  Take your time so you do not get dizzy or light-headed.  If you are in pain, you may need to take or ask for pain medication before doing incentive spirometry. It is harder to take a  deep breath if you are having pain. AFTER USE  Rest and breathe slowly and easily.  It can be helpful to keep track of a log of your progress. Your caregiver can provide you with a simple table to help with this. If you are using the spirometer at home, follow these instructions: Many IF:   You are having difficultly using the spirometer.  You have trouble using the spirometer as often as instructed.  Your pain medication is not giving enough relief while using the spirometer.  You develop fever of 100.5 F (38.1 C) or higher. SEEK IMMEDIATE MEDICAL CARE IF:   You cough up bloody sputum that had not been present before.  You develop fever of 102 F (38.9 C) or greater.  You develop worsening pain at or near the incision site. MAKE SURE YOU:   Understand these instructions.  Will watch your condition.  Will get help right away if you are not doing well or get worse. Document Released: 06/02/2006 Document Revised: 04/14/2011 Document Reviewed: 08/03/2006 ExitCare Patient Information 2014 ExitCare, [place].   ________________________________________________________________________  WHAT IS A BLOOD TRANSFUSION? Blood Transfusion Information  A transfusion is the replacement of blood or some of its parts. Blood is made up of multiple cells which provide different functions.  Red blood cells carry oxygen and are used for blood loss replacement.  White blood cells fight against infection.  Platelets control bleeding.  Plasma helps clot blood.  Other blood products are available for specialized needs, such as hemophilia or other clotting disorders. BEFORE THE TRANSFUSION  Who gives blood for transfusions?   Healthy volunteers who are fully evaluated to make sure their blood is safe. This is blood bank blood. Transfusion therapy is the safest it has ever been in the practice of medicine. Before blood is taken from a donor, a complete history is taken to make  sure that person has no history of diseases nor engages in risky social behavior (examples are intravenous drug use or sexual activity with multiple partners). The donor's travel history is screened to minimize risk of transmitting infections, such as malaria. The donated blood is tested for signs of infectious diseases, such as HIV and hepatitis. The blood is then tested to be sure it is compatible with you in order to minimize the chance of a transfusion reaction. If you or a relative donates blood, this is often done in anticipation of surgery and is not appropriate for emergency situations. It takes many days to process the donated blood. RISKS AND COMPLICATIONS Although transfusion therapy is very safe and saves many lives, the main dangers of transfusion include:   Getting an infectious disease.  Developing a transfusion reaction. This is an allergic reaction to something in the blood you were given. Every precaution is taken to prevent this. The decision to have a blood transfusion has been considered carefully by your caregiver before blood is given. Blood is not given unless the benefits outweigh the risks. AFTER THE TRANSFUSION  Right after receiving a blood transfusion, you will usually feel much better  and more energetic. This is especially true if your red blood cells have gotten low (anemic). The transfusion raises the level of the red blood cells which carry oxygen, and this usually causes an energy increase.  The nurse administering the transfusion will monitor you carefully for complications. HOME CARE INSTRUCTIONS  No special instructions are needed after a transfusion. You may find your energy is better. Speak with your caregiver about any limitations on activity for underlying diseases you may have. SEEK MEDICAL CARE IF:   Your condition is not improving after your transfusion.  You develop redness or irritation at the intravenous (IV) site. SEEK IMMEDIATE MEDICAL CARE IF:   Any of the following symptoms occur over the next 12 hours:  Shaking chills.  You have a temperature by mouth above 102 F (38.9 C), not controlled by medicine.  Chest, back, or muscle pain.  People around you feel you are not acting correctly or are confused.  Shortness of breath or difficulty breathing.  Dizziness and fainting.  You get a rash or develop hives.  You have a decrease in urine output.  Your urine turns a dark color or changes to pink, red, or brown. Any of the following symptoms occur over the next 10 days:  You have a temperature by mouth above 102 F (38.9 C), not controlled by medicine.  Shortness of breath.  Weakness after normal activity.  The white part of the eye turns yellow (jaundice).  You have a decrease in the amount of urine or are urinating less often.  Your urine turns a dark color or changes to pink, red, or brown. Document Released: 01/18/2000 Document Revised: 04/14/2011 Document Reviewed: 09/06/2007 [facility] Patient Information 2014 [place], [place].  _______________________________________________________________________

## 2016-04-01 ENCOUNTER — Encounter (HOSPITAL_COMMUNITY)
Admission: RE | Admit: 2016-04-01 | Discharge: 2016-04-01 | Disposition: A | Discharge status: Home / Self Care | Payer: Medicaid Other | Source: Ambulatory Visit | Attending: Orthopedic Surgery | Admitting: Orthopedic Surgery

## 2016-04-01 ENCOUNTER — Encounter (HOSPITAL_COMMUNITY): Payer: Self-pay | Admitting: Emergency Medicine

## 2016-04-01 DIAGNOSIS — Z01812 Encounter for preprocedural laboratory examination: Secondary | ICD-10-CM | POA: Diagnosis present

## 2016-04-01 DIAGNOSIS — M25552 Pain in left hip: Secondary | ICD-10-CM | POA: Diagnosis not present

## 2016-04-01 DIAGNOSIS — Z01810 Encounter for preprocedural cardiovascular examination: Secondary | ICD-10-CM | POA: Diagnosis present

## 2016-04-01 DIAGNOSIS — I10 Essential (primary) hypertension: Secondary | ICD-10-CM | POA: Insufficient documentation

## 2016-04-01 DIAGNOSIS — M1612 Unilateral primary osteoarthritis, left hip: Secondary | ICD-10-CM | POA: Diagnosis not present

## 2016-04-01 DIAGNOSIS — I498 Other specified cardiac arrhythmias: Secondary | ICD-10-CM | POA: Diagnosis not present

## 2016-04-01 HISTORY — DX: Mononeuropathy, unspecified: G58.9

## 2016-04-01 HISTORY — DX: Spondylosis without myelopathy or radiculopathy, lumbar region: M47.816

## 2016-04-01 HISTORY — DX: Injury of unspecified nerves of neck, initial encounter: S14.9XXA

## 2016-04-01 LAB — CBC
HCT: 37.4 % (ref 36.0–46.0)
Hemoglobin: 12.6 g/dL (ref 12.0–15.0)
MCH: 30.2 pg (ref 26.0–34.0)
MCHC: 33.7 g/dL (ref 30.0–36.0)
MCV: 89.7 fL (ref 78.0–100.0)
Platelets: 384 10*3/uL (ref 150–400)
RBC: 4.17 MIL/uL (ref 3.87–5.11)
RDW: 13.1 % (ref 11.5–15.5)
WBC: 11 10*3/uL — AB (ref 4.0–10.5)

## 2016-04-01 LAB — BASIC METABOLIC PANEL
Anion gap: 8 (ref 5–15)
BUN: 23 mg/dL — AB (ref 6–20)
CO2: 25 mmol/L (ref 22–32)
CREATININE: 0.91 mg/dL (ref 0.44–1.00)
Calcium: 9.4 mg/dL (ref 8.9–10.3)
Chloride: 107 mmol/L (ref 101–111)
GFR calc Af Amer: >60 mL/min (ref >60)
GLUCOSE: 90 mg/dL (ref 65–99)
Potassium: 4.3 mmol/L (ref 3.5–5.1)
SODIUM: 140 mmol/L (ref 135–145)

## 2016-04-01 LAB — SURGICAL PCR SCREEN
MRSA, PCR: NEGATIVE
STAPHYLOCOCCUS AUREUS: NEGATIVE

## 2016-04-01 LAB — ABO/RH: ABO/RH(D): A POS

## 2016-04-01 NOTE — Telephone Encounter (Signed)
MA unable to contact the patient due to no answer or voicemail being set up. A communication letter will be sent out to the patient.

## 2016-04-01 NOTE — Telephone Encounter (Signed)
-----   Message from Alfonse Spruce, Arnold sent at 03/27/2016  1:15 PM EST ----- Microalbumin/creatinine ratio level was elevated. This tests for protein in your urine that could indicate early signs of kidney damage. Take your lisinopril-hydrochlorothiazide as directed.  Kidney function normal

## 2016-04-02 NOTE — Progress Notes (Signed)
Medical clearance on chart. 

## 2016-04-03 ENCOUNTER — Ambulatory Visit: Payer: Medicaid Other | Attending: Family Medicine | Admitting: Pharmacist

## 2016-04-03 VITALS — BP 120/88 | HR 66

## 2016-04-03 DIAGNOSIS — Z79899 Other long term (current) drug therapy: Secondary | ICD-10-CM | POA: Insufficient documentation

## 2016-04-03 DIAGNOSIS — I10 Essential (primary) hypertension: Secondary | ICD-10-CM

## 2016-04-03 DIAGNOSIS — M25559 Pain in unspecified hip: Secondary | ICD-10-CM | POA: Insufficient documentation

## 2016-04-03 NOTE — Patient Instructions (Addendum)
Thanks for coming to see me!  Your blood pressure looks great, keep taking your medicine  Good luck with your surgery next week!!!

## 2016-04-03 NOTE — Progress Notes (Signed)
   S:    Patient arrives in good spirits.  Presents to the clinic for hypertension evaluation. Patient was referred on 03/20/16 by Fredia Beets NP/Dr. Doreene Burke .  Patient was last seen by Primary Care Provider on  03/20/16  Patient reports adherence with medications.  Current BP Medications include:  Lisinopril-hydrochlorothiazide 10-12.5 mg daily. She did take her medication today.  Antihypertensives tried in the past include: none  Of note, patient is awaiting hip replacement surgery which is to happen next week. She is currently has a lot of hip pain.    O:   Last 3 Office BP readings: BP Readings from Last 3 Encounters:  04/03/16 120/88  04/01/16 (!) 144/83  03/20/16 (!) 145/78    BMET    Component Value Date/Time   NA 140 04/01/2016 1033   K 4.3 04/01/2016 1033   CL 107 04/01/2016 1033   CO2 25 04/01/2016 1033   GLUCOSE 90 04/01/2016 1033   BUN 23 (H) 04/01/2016 1033   CREATININE 0.91 04/01/2016 1033   CREATININE 0.83 03/20/2016 1217   CALCIUM 9.4 04/01/2016 1033   GFRNONAA >60 04/01/2016 1033   GFRNONAA 80 03/20/2016 1217   GFRAA >60 04/01/2016 1033   GFRAA >89 03/20/2016 1217    A/P: Hypertension longstanding currently controlled on current medications. First reading was slighly above goal but this was likely due to pain from walking. Once patient rested, it was at goal.  Continued medications as prescribed.  Patient needed paperwork signed for disability placard for her car. Paperwork signed by Dr. Doreene Burke.  Results reviewed and written information provided.   Total time in face-to-face counseling 15 minutes.   F/U Clinic Visit with Fredia Beets NP as directed.

## 2016-04-08 ENCOUNTER — Inpatient Hospital Stay (HOSPITAL_COMMUNITY): Payer: Medicaid Other | Admitting: Registered Nurse

## 2016-04-08 ENCOUNTER — Inpatient Hospital Stay (HOSPITAL_COMMUNITY): Payer: Medicaid Other

## 2016-04-08 ENCOUNTER — Encounter (HOSPITAL_COMMUNITY): Payer: Self-pay | Admitting: *Deleted

## 2016-04-08 ENCOUNTER — Encounter (HOSPITAL_COMMUNITY)
Admission: RE | Disposition: A | Discharge status: Home / Self Care | Payer: Self-pay | Source: Ambulatory Visit | Attending: Orthopedic Surgery

## 2016-04-08 ENCOUNTER — Inpatient Hospital Stay (HOSPITAL_COMMUNITY)
Admission: RE | Admit: 2016-04-08 | Discharge: 2016-04-09 | DRG: 470 | Disposition: A | Discharge status: Home / Self Care | Payer: Medicaid Other | Source: Ambulatory Visit | Attending: Orthopedic Surgery | Admitting: Orthopedic Surgery

## 2016-04-08 DIAGNOSIS — E669 Obesity, unspecified: Secondary | ICD-10-CM | POA: Diagnosis present

## 2016-04-08 DIAGNOSIS — M47816 Spondylosis without myelopathy or radiculopathy, lumbar region: Secondary | ICD-10-CM | POA: Diagnosis present

## 2016-04-08 DIAGNOSIS — Z886 Allergy status to analgesic agent status: Secondary | ICD-10-CM

## 2016-04-08 DIAGNOSIS — F17210 Nicotine dependence, cigarettes, uncomplicated: Secondary | ICD-10-CM | POA: Diagnosis present

## 2016-04-08 DIAGNOSIS — Z885 Allergy status to narcotic agent status: Secondary | ICD-10-CM | POA: Diagnosis not present

## 2016-04-08 DIAGNOSIS — Z96649 Presence of unspecified artificial hip joint: Secondary | ICD-10-CM

## 2016-04-08 DIAGNOSIS — Z881 Allergy status to other antibiotic agents status: Secondary | ICD-10-CM

## 2016-04-08 DIAGNOSIS — Z6835 Body mass index (BMI) 35.0-35.9, adult: Secondary | ICD-10-CM | POA: Diagnosis not present

## 2016-04-08 DIAGNOSIS — M62838 Other muscle spasm: Secondary | ICD-10-CM

## 2016-04-08 DIAGNOSIS — M1612 Unilateral primary osteoarthritis, left hip: Secondary | ICD-10-CM | POA: Diagnosis present

## 2016-04-08 DIAGNOSIS — Z833 Family history of diabetes mellitus: Secondary | ICD-10-CM

## 2016-04-08 DIAGNOSIS — I10 Essential (primary) hypertension: Secondary | ICD-10-CM | POA: Diagnosis present

## 2016-04-08 DIAGNOSIS — M25552 Pain in left hip: Secondary | ICD-10-CM | POA: Diagnosis present

## 2016-04-08 DIAGNOSIS — Z87442 Personal history of urinary calculi: Secondary | ICD-10-CM | POA: Diagnosis not present

## 2016-04-08 DIAGNOSIS — Z8249 Family history of ischemic heart disease and other diseases of the circulatory system: Secondary | ICD-10-CM

## 2016-04-08 DIAGNOSIS — Z96642 Presence of left artificial hip joint: Secondary | ICD-10-CM

## 2016-04-08 HISTORY — PX: TOTAL HIP ARTHROPLASTY: SHX124

## 2016-04-08 LAB — TYPE AND SCREEN
ABO/RH(D): A POS
ANTIBODY SCREEN: NEGATIVE

## 2016-04-08 SURGERY — ARTHROPLASTY, HIP, TOTAL, ANTERIOR APPROACH
Anesthesia: Spinal | Site: Hip | Laterality: Left

## 2016-04-08 MED ORDER — METOCLOPRAMIDE HCL 5 MG PO TABS: 5.0000 mg | ORAL_TABLET | Freq: Three times a day (TID) | ORAL | Status: DC | PRN

## 2016-04-08 MED ORDER — SODIUM CHLORIDE 0.9 % IR SOLN
Status: DC | PRN
  Administered 2016-04-08: 1000 mL

## 2016-04-08 MED ORDER — ONDANSETRON HCL 4 MG/2ML IJ SOLN: 4.0000 mg | Freq: Four times a day (QID) | INTRAMUSCULAR | Status: DC | PRN

## 2016-04-08 MED ORDER — OXYCODONE HCL 5 MG PO TABS
5.0000 mg | ORAL_TABLET | ORAL | Status: DC
  Administered 2016-04-08 (×2): 10 mg via ORAL
  Administered 2016-04-08: 5 mg via ORAL
  Administered 2016-04-09 (×5): 10 mg via ORAL
  Filled 2016-04-08: qty 2
  Filled 2016-04-08: qty 1
  Filled 2016-04-08 (×6): qty 2

## 2016-04-08 MED ORDER — PROPOFOL 500 MG/50ML IV EMUL
INTRAVENOUS | Status: DC | PRN
  Administered 2016-04-08: 150 ug/kg/min via INTRAVENOUS

## 2016-04-08 MED ORDER — MIDAZOLAM HCL 5 MG/5ML IJ SOLN
INTRAMUSCULAR | Status: DC | PRN
  Administered 2016-04-08: 2 mg via INTRAVENOUS

## 2016-04-08 MED ORDER — METHOCARBAMOL 1000 MG/10ML IJ SOLN
500.0000 mg | Freq: Four times a day (QID) | INTRAVENOUS | Status: DC | PRN
  Administered 2016-04-08: 500 mg via INTRAVENOUS
  Filled 2016-04-08: qty 550
  Filled 2016-04-08: qty 5

## 2016-04-08 MED ORDER — SODIUM CHLORIDE 0.9 % IV SOLN
100.0000 mL/h | INTRAVENOUS | Status: DC
  Administered 2016-04-08 – 2016-04-09 (×2): 100 mL/h via INTRAVENOUS
  Filled 2016-04-08 (×5): qty 1000

## 2016-04-08 MED ORDER — FENTANYL CITRATE (PF) 100 MCG/2ML IJ SOLN
INTRAMUSCULAR | Status: DC | PRN
  Administered 2016-04-08 (×2): 50 ug via INTRAVENOUS

## 2016-04-08 MED ORDER — ASPIRIN 81 MG PO CHEW
81.0000 mg | CHEWABLE_TABLET | Freq: Two times a day (BID) | ORAL | Status: DC
  Administered 2016-04-08 – 2016-04-09 (×2): 81 mg via ORAL
  Filled 2016-04-08 (×2): qty 1

## 2016-04-08 MED ORDER — TRANEXAMIC ACID 1000 MG/10ML IV SOLN
1000.0000 mg | INTRAVENOUS | Status: AC
  Administered 2016-04-08: 1000 mg via INTRAVENOUS
  Filled 2016-04-08: qty 1100

## 2016-04-08 MED ORDER — PROPOFOL 10 MG/ML IV BOLUS
INTRAVENOUS | Status: AC
  Filled 2016-04-08: qty 60

## 2016-04-08 MED ORDER — MAGNESIUM CITRATE PO SOLN: 1.0000 | Freq: Once | ORAL | Status: DC | PRN

## 2016-04-08 MED ORDER — ONDANSETRON HCL 4 MG/2ML IJ SOLN
INTRAMUSCULAR | Status: AC
  Filled 2016-04-08: qty 2

## 2016-04-08 MED ORDER — MEPERIDINE HCL 50 MG/ML IJ SOLN: 6.2500 mg | INTRAMUSCULAR | Status: DC | PRN

## 2016-04-08 MED ORDER — ASPIRIN 81 MG PO CHEW: 81.0000 mg | CHEWABLE_TABLET | Freq: Two times a day (BID) | ORAL | 0 refills | Status: AC

## 2016-04-08 MED ORDER — DEXAMETHASONE SODIUM PHOSPHATE 10 MG/ML IJ SOLN
10.0000 mg | Freq: Once | INTRAMUSCULAR | Status: AC
Start: 2016-04-09 — End: 2016-04-09
  Administered 2016-04-09: 08:00:00 10 mg via INTRAVENOUS
  Filled 2016-04-08: qty 1

## 2016-04-08 MED ORDER — CELECOXIB 200 MG PO CAPS
200.0000 mg | ORAL_CAPSULE | Freq: Two times a day (BID) | ORAL | Status: DC
  Administered 2016-04-09: 08:00:00 200 mg via ORAL
  Filled 2016-04-08 (×2): qty 1

## 2016-04-08 MED ORDER — CEFAZOLIN SODIUM-DEXTROSE 2-4 GM/100ML-% IV SOLN
2.0000 g | Freq: Four times a day (QID) | INTRAVENOUS | Status: AC
  Administered 2016-04-08 (×2): 2 g via INTRAVENOUS
  Filled 2016-04-08 (×2): qty 100

## 2016-04-08 MED ORDER — PHENYLEPHRINE HCL 10 MG/ML IJ SOLN
INTRAMUSCULAR | Status: AC
  Filled 2016-04-08: qty 1

## 2016-04-08 MED ORDER — PHENOL 1.4 % MT LIQD: 1.0000 | OROMUCOSAL | Status: DC | PRN

## 2016-04-08 MED ORDER — ONDANSETRON HCL 4 MG/2ML IJ SOLN: 4.0000 mg | Freq: Once | INTRAMUSCULAR | Status: DC | PRN

## 2016-04-08 MED ORDER — LACTATED RINGERS IV SOLN
INTRAVENOUS | Status: DC
  Administered 2016-04-08 (×3): via INTRAVENOUS

## 2016-04-08 MED ORDER — ONDANSETRON HCL 4 MG PO TABS: 4.0000 mg | ORAL_TABLET | Freq: Four times a day (QID) | ORAL | Status: DC | PRN

## 2016-04-08 MED ORDER — PROPOFOL 10 MG/ML IV BOLUS
INTRAVENOUS | Status: AC
  Filled 2016-04-08: qty 20

## 2016-04-08 MED ORDER — FENTANYL CITRATE (PF) 100 MCG/2ML IJ SOLN
INTRAMUSCULAR | Status: AC
  Filled 2016-04-08: qty 2

## 2016-04-08 MED ORDER — MENTHOL 3 MG MT LOZG: 1.0000 | LOZENGE | OROMUCOSAL | Status: DC | PRN

## 2016-04-08 MED ORDER — FENTANYL CITRATE (PF) 100 MCG/2ML IJ SOLN
25.0000 ug | INTRAMUSCULAR | Status: DC | PRN
  Administered 2016-04-08: 50 ug via INTRAVENOUS
  Administered 2016-04-08: 25 ug via INTRAVENOUS

## 2016-04-08 MED ORDER — METOCLOPRAMIDE HCL 5 MG/ML IJ SOLN
5.0000 mg | Freq: Three times a day (TID) | INTRAMUSCULAR | Status: DC | PRN
Start: 2016-04-08 — End: 2016-04-09

## 2016-04-08 MED ORDER — ONDANSETRON HCL 4 MG/2ML IJ SOLN
INTRAMUSCULAR | Status: DC | PRN
  Administered 2016-04-08: 4 mg via INTRAVENOUS

## 2016-04-08 MED ORDER — DEXAMETHASONE SODIUM PHOSPHATE 10 MG/ML IJ SOLN
INTRAMUSCULAR | Status: DC | PRN
  Administered 2016-04-08: 10 mg via INTRAVENOUS

## 2016-04-08 MED ORDER — FERROUS SULFATE 325 (65 FE) MG PO TABS: 325.0000 mg | ORAL_TABLET | Freq: Three times a day (TID) | ORAL | Status: DC

## 2016-04-08 MED ORDER — PROPOFOL 10 MG/ML IV BOLUS
INTRAVENOUS | Status: AC
  Filled 2016-04-08: qty 40

## 2016-04-08 MED ORDER — DIPHENHYDRAMINE HCL 25 MG PO CAPS: 25.0000 mg | ORAL_CAPSULE | Freq: Four times a day (QID) | ORAL | Status: DC | PRN

## 2016-04-08 MED ORDER — CHLORHEXIDINE GLUCONATE 4 % EX LIQD: 60.0000 mL | Freq: Once | CUTANEOUS | Status: DC

## 2016-04-08 MED ORDER — HYDROMORPHONE HCL 1 MG/ML IJ SOLN
0.5000 mg | INTRAMUSCULAR | Status: DC | PRN
  Administered 2016-04-08 – 2016-04-09 (×2): 1 mg via INTRAVENOUS
  Filled 2016-04-08 (×2): qty 1

## 2016-04-08 MED ORDER — CYCLOBENZAPRINE HCL 10 MG PO TABS: 10.0000 mg | ORAL_TABLET | Freq: Three times a day (TID) | ORAL | 0 refills | Status: DC | PRN

## 2016-04-08 MED ORDER — PROPOFOL 10 MG/ML IV BOLUS
INTRAVENOUS | Status: DC | PRN
  Administered 2016-04-08: 50 mg via INTRAVENOUS
  Administered 2016-04-08: 30 mg via INTRAVENOUS
  Administered 2016-04-08: 50 mg via INTRAVENOUS

## 2016-04-08 MED ORDER — POLYETHYLENE GLYCOL 3350 17 G PO PACK
17.0000 g | PACK | Freq: Two times a day (BID) | ORAL | Status: DC
  Administered 2016-04-08 – 2016-04-09 (×2): 17 g via ORAL
  Filled 2016-04-08 (×2): qty 1

## 2016-04-08 MED ORDER — ALUM & MAG HYDROXIDE-SIMETH 200-200-20 MG/5ML PO SUSP: 30.0000 mL | ORAL | Status: DC | PRN

## 2016-04-08 MED ORDER — FERROUS SULFATE 325 (65 FE) MG PO TABS
325.0000 mg | ORAL_TABLET | Freq: Three times a day (TID) | ORAL | Status: DC
  Filled 2016-04-08: qty 1

## 2016-04-08 MED ORDER — DOCUSATE SODIUM 100 MG PO CAPS
100.0000 mg | ORAL_CAPSULE | Freq: Two times a day (BID) | ORAL | Status: DC
  Administered 2016-04-08 – 2016-04-09 (×2): 100 mg via ORAL
  Filled 2016-04-08 (×2): qty 1

## 2016-04-08 MED ORDER — PHENYLEPHRINE HCL 10 MG/ML IJ SOLN
INTRAVENOUS | Status: DC | PRN
  Administered 2016-04-08: 35 ug/min via INTRAVENOUS

## 2016-04-08 MED ORDER — DOCUSATE SODIUM 100 MG PO CAPS: 100.0000 mg | ORAL_CAPSULE | Freq: Two times a day (BID) | ORAL | 0 refills | Status: DC

## 2016-04-08 MED ORDER — CEFAZOLIN SODIUM-DEXTROSE 2-4 GM/100ML-% IV SOLN
2.0000 g | INTRAVENOUS | Status: AC
  Administered 2016-04-08: 2 g via INTRAVENOUS
  Filled 2016-04-08: qty 100

## 2016-04-08 MED ORDER — OXYCODONE HCL 5 MG PO TABS: 5.0000 mg | ORAL_TABLET | ORAL | 0 refills | Status: DC | PRN

## 2016-04-08 MED ORDER — METHOCARBAMOL 500 MG PO TABS
500.0000 mg | ORAL_TABLET | Freq: Four times a day (QID) | ORAL | Status: DC | PRN
  Administered 2016-04-09 (×3): 500 mg via ORAL
  Filled 2016-04-08 (×3): qty 1

## 2016-04-08 MED ORDER — MIDAZOLAM HCL 2 MG/2ML IJ SOLN
INTRAMUSCULAR | Status: AC
  Filled 2016-04-08: qty 2

## 2016-04-08 MED ORDER — BISACODYL 10 MG RE SUPP: 10.0000 mg | Freq: Every day | RECTAL | Status: DC | PRN

## 2016-04-08 MED ORDER — DEXAMETHASONE SODIUM PHOSPHATE 10 MG/ML IJ SOLN
INTRAMUSCULAR | Status: AC
  Filled 2016-04-08: qty 1

## 2016-04-08 MED ORDER — POLYETHYLENE GLYCOL 3350 17 G PO PACK: 17.0000 g | PACK | Freq: Two times a day (BID) | ORAL | 0 refills | Status: DC

## 2016-04-08 SURGICAL SUPPLY — 39 items
ADH SKN CLS APL DERMABOND .7 (GAUZE/BANDAGES/DRESSINGS) ×1
BAG DECANTER FOR FLEXI CONT (MISCELLANEOUS) IMPLANT
BAG SPEC THK2 15X12 ZIP CLS (MISCELLANEOUS)
BAG ZIPLOCK 12X15 (MISCELLANEOUS) IMPLANT
BLADE SAG 18X100X1.27 (BLADE) ×3 IMPLANT
BLADE SAW SGTL 11.0X1.19X90.0M (BLADE) IMPLANT
CAPT HIP TOTAL 2 ×2 IMPLANT
CLOTH BEACON ORANGE TIMEOUT ST (SAFETY) ×3 IMPLANT
COVER PERINEAL POST (MISCELLANEOUS) ×3 IMPLANT
DERMABOND ADVANCED (GAUZE/BANDAGES/DRESSINGS) ×2
DERMABOND ADVANCED .7 DNX12 (GAUZE/BANDAGES/DRESSINGS) ×1 IMPLANT
DRAPE STERI IOBAN 125X83 (DRAPES) ×3 IMPLANT
DRAPE U-SHAPE 47X51 STRL (DRAPES) ×6 IMPLANT
DRESSING AQUACEL AG SP 3.5X10 (GAUZE/BANDAGES/DRESSINGS) ×1 IMPLANT
DRSG AQUACEL AG ADV 3.5X10 (GAUZE/BANDAGES/DRESSINGS) ×2 IMPLANT
DRSG AQUACEL AG SP 3.5X10 (GAUZE/BANDAGES/DRESSINGS) ×3
DURAPREP 26ML APPLICATOR (WOUND CARE) ×3 IMPLANT
ELECT REM PT RETURN 15FT ADLT (MISCELLANEOUS) IMPLANT
ELECT REM PT RETURN 9FT ADLT (ELECTROSURGICAL) ×3
ELECTRODE REM PT RTRN 9FT ADLT (ELECTROSURGICAL) ×1 IMPLANT
GLOVE BIOGEL M STRL SZ7.5 (GLOVE) ×4 IMPLANT
GLOVE BIOGEL PI IND STRL 7.5 (GLOVE) ×1 IMPLANT
GLOVE BIOGEL PI IND STRL 8.5 (GLOVE) ×1 IMPLANT
GLOVE BIOGEL PI INDICATOR 7.5 (GLOVE) ×12
GLOVE BIOGEL PI INDICATOR 8.5 (GLOVE)
GLOVE ECLIPSE 8.0 STRL XLNG CF (GLOVE) ×2 IMPLANT
GLOVE ORTHO TXT STRL SZ7.5 (GLOVE) ×3 IMPLANT
GOWN STRL REUS W/TWL LRG LVL3 (GOWN DISPOSABLE) ×5 IMPLANT
GOWN STRL REUS W/TWL XL LVL3 (GOWN DISPOSABLE) ×5 IMPLANT
HOLDER FOLEY CATH W/STRAP (MISCELLANEOUS) ×3 IMPLANT
PACK ANTERIOR HIP CUSTOM (KITS) ×3 IMPLANT
SUT MNCRL AB 4-0 PS2 18 (SUTURE) ×3 IMPLANT
SUT VIC AB 1 CT1 36 (SUTURE) ×9 IMPLANT
SUT VIC AB 2-0 CT1 27 (SUTURE) ×6
SUT VIC AB 2-0 CT1 TAPERPNT 27 (SUTURE) ×2 IMPLANT
SUT VLOC 180 0 24IN GS25 (SUTURE) ×3 IMPLANT
TRAY FOLEY CATH 14FRSI W/METER (CATHETERS) ×2 IMPLANT
WATER STERILE IRR 1500ML POUR (IV SOLUTION) ×5 IMPLANT
YANKAUER SUCT BULB TIP 10FT TU (MISCELLANEOUS) IMPLANT

## 2016-04-08 NOTE — Evaluation (Signed)
Physical Therapy Evaluation Patient Details Name: Kimberly Sosa MRN: [id number] DOB: 1960-03-08 Today's Date: 04/08/2016   History of Present Illness  s/p L DA THA  Clinical Impression  Pt is s/p THA resulting in the deficits listed below (see PT Problem List). * Pt will benefit from skilled PT to increase their independence and safety with mobility to allow discharge to the venue listed below.  amb 22' with min/guard and RW, should progress well, pt is very motivated     Follow Up Recommendations No PT follow up;Home health PT;Outpatient PT (per MD)    Equipment Recommendations  Rolling walker with 5" wheels    Recommendations for Other Services       Precautions / Restrictions Precautions Precautions: Fall Restrictions Weight Bearing Restrictions: No Other Position/Activity Restrictions: WBAT      Mobility  Bed Mobility Overal bed mobility: Needs Assistance Bed Mobility: Supine to Sit     Supine to sit: Min assist     General bed mobility comments: with LLE  Transfers Overall transfer level: Needs assistance Equipment used: Rolling walker (2 wheeled) Transfers: Sit to/from Stand           General transfer comment: min/guard to rise and stabilize, cues for hand placement and LLE position  Ambulation/Gait Ambulation/Gait assistance: Min guard Ambulation Distance (Feet): 22 Feet Assistive device: Rolling walker (2 wheeled) Gait Pattern/deviations: Step-to pattern     General Gait Details: cues for sequence and posture  Stairs            Wheelchair Mobility    Modified Rankin (Stroke Patients Only)       Balance                                             Pertinent Vitals/Pain Pain Assessment: 0-10 Pain Score: 8  Pain Location: L hip Pain Descriptors / Indicators: Sore Pain Intervention(s): Limited activity within patient's tolerance;Monitored during session;Premedicated before session;Repositioned;Ice applied     Home Living Family/patient expects to be discharged to:: Private residence Living Arrangements: Children (staying with dtr) Available Help at Discharge: Family Type of Home: House Home Access: Stairs to enter   Technical brewer of Steps: 2 Home Layout: One level Home Equipment: None      Prior Function Level of Independence: Independent               Hand Dominance        Extremity/Trunk Assessment   Upper Extremity Assessment Upper Extremity Assessment: Defer to OT evaluation;Overall [facility] for tasks assessed    Lower Extremity Assessment Lower Extremity Assessment: LLE deficits/detail LLE Deficits / Details: ankle WFL, knee AROM grossly WFL, strength hip and knee 3 to 3+/5 LLE: Unable to fully assess due to pain       Communication   Communication: No difficulties  Cognition Arousal/Alertness: Awake/alert Behavior During Therapy: WFL for tasks assessed/performed Overall Cognitive Status: Within Functional Limits for tasks assessed                      General Comments      Exercises Total Joint Exercises Ankle Circles/Pumps: AROM;Both;10 reps Quad Sets: 10 reps;Both;AROM Short Arc Quad: AROM;Left;5 reps   Assessment/Plan    PT Assessment Patient needs continued PT services  PT Problem List Decreased strength;Decreased activity tolerance;Decreased mobility;Decreased knowledge of use of DME;Pain  PT Treatment Interventions Functional mobility training;Stair training;Gait training;DME instruction;Therapeutic activities;Therapeutic exercise;Patient/family education    PT Goals (Current goals can be found in the Care Plan section)  Acute Rehab PT Goals Patient Stated Goal: twerk! PT Goal Formulation: With patient Time For Goal Achievement: 04/15/16 Potential to Achieve Goals: Good    Frequency 7X/week   Barriers to discharge        Co-evaluation               End of Session   Activity Tolerance: Patient tolerated  treatment well Patient left: in chair;with call bell/phone within reach;with chair alarm set Nurse Communication: Mobility status PT Visit Diagnosis: Other abnormalities of gait and mobility (R26.89)         Time: [id number] PT Time Calculation (min) (ACUTE ONLY): 22 min   Charges:   PT Evaluation $PT Eval Low Complexity: 1 Procedure     PT G Codes:         Chelsa Stout Apr 26, 2016, 3:40 PM

## 2016-04-08 NOTE — Anesthesia Procedure Notes (Signed)
Spinal  Patient location during procedure: OR End time: 04/08/2016 8:54 AM Staffing Resident/CRNA: Enrigue Catena E Performed: anesthesiologist  Preanesthetic Checklist Completed: patient identified, site marked, surgical consent, pre-op evaluation, timeout performed, IV checked, risks and benefits discussed and monitors and equipment checked Spinal Block Patient position: sitting Prep: DuraPrep Patient monitoring: heart rate, continuous pulse ox and blood pressure Approach: left paramedian Location: L2-3 Injection technique: single-shot Needle Needle type: Sprotte  Needle gauge: 24 G Needle length: 9 cm Additional Notes Expiration date of kit checked and confirmed. Patient tolerated procedure well, without complications.

## 2016-04-08 NOTE — Anesthesia Postprocedure Evaluation (Signed)
Anesthesia Post Note  Patient: VANNIDA CLUSTER  Procedure(s) Performed: Procedure(s) (LRB): LEFT TOTAL HIP ARTHROPLASTY ANTERIOR APPROACH (Left)  Patient location during evaluation: PACU Anesthesia Type: Spinal Level of consciousness: awake and alert and oriented Pain management: pain level controlled Vital Signs Assessment: post-procedure vital signs reviewed and stable Respiratory status: spontaneous breathing, nonlabored ventilation and respiratory function stable Cardiovascular status: blood pressure returned to baseline and stable Postop Assessment: no signs of nausea or vomiting, spinal receding, no backache, no headache and patient able to bend at knees Anesthetic complications: no       Last Vitals:  Vitals:   04/08/16 1051 04/08/16 1100  BP:  121/72  Pulse:  82  Resp: 16 20  Temp: 36.5 C     Last Pain:  Vitals:   04/08/16 1115  TempSrc:   PainSc: (P) 7                  Irelyn Perfecto A.

## 2016-04-08 NOTE — Anesthesia Preprocedure Evaluation (Signed)
Anesthesia Evaluation  Patient identified by MRN, date of birth, ID band Patient awake    Reviewed: Allergy & Precautions, NPO status , Patient's Chart, lab work & pertinent test results  Airway Mallampati: I  TM Distance: >3 FB Neck ROM: Full    Dental no notable dental hx. (+) Teeth Intact Orthodontic appliances:   Pulmonary Current Smoker,    Pulmonary exam normal breath sounds clear to auscultation       Cardiovascular hypertension, Pt. on medications Normal cardiovascular exam Rhythm:Regular Rate:Normal     Neuro/Psych negative neurological ROS  negative psych ROS   GI/Hepatic negative GI ROS, Neg liver ROS,   Endo/Other    Renal/GU Renal diseaseHx/o Renal calculi  negative genitourinary   Musculoskeletal  (+) Arthritis , Osteoarthritis,  DJD left hip   Abdominal (+) + obese,   Peds  Hematology negative hematology ROS (+)   Anesthesia Other Findings   Reproductive/Obstetrics                             Anesthesia Physical Anesthesia Plan  ASA: II  Anesthesia Plan: Spinal   Post-op Pain Management:    Induction:   Airway Management Planned: Natural Airway  Additional Equipment:   Intra-op Plan:   Post-operative Plan:   Informed Consent: I have reviewed the patients History and Physical, chart, labs and discussed the procedure including the risks, benefits and alternatives for the proposed anesthesia with the patient or authorized representative who has indicated his/her understanding and acceptance.   Dental advisory given  Plan Discussed with: CRNA, Anesthesiologist and Surgeon  Anesthesia Plan Comments:         Anesthesia Quick Evaluation

## 2016-04-08 NOTE — Interval H&P Note (Signed)
History and Physical Interval Note:  04/08/2016 6:44 AM  Kimberly Sosa  has presented today for surgery, with the diagnosis of Left hip osteoarthritis  The various methods of treatment have been discussed with the patient and family. After consideration of risks, benefits and other options for treatment, the patient has consented to  Procedure(s) with comments: LEFT TOTAL HIP ARTHROPLASTY ANTERIOR APPROACH (Left) - Requests 70 mins as a surgical intervention .  The patient's history has been reviewed, patient examined, no change in status, stable for surgery.  I have reviewed the patient's chart and labs.  Questions were answered to the patient's satisfaction.     Mauri Pole

## 2016-04-08 NOTE — Discharge Instructions (Signed)

## 2016-04-08 NOTE — Op Note (Signed)
NAME:  Kimberly Sosa.: [id number]      MEDICAL RECORD NO.: [id number]      FACILITY:  [facility]      PHYSICIAN:  Paralee Cancel D  DATE OF BIRTH:  [birth year]     DATE OF PROCEDURE:  04/08/2016                                 OPERATIVE REPORT         PREOPERATIVE DIAGNOSIS: Left  hip osteoarthritis.      POSTOPERATIVE DIAGNOSIS:  Left hip osteoarthritis.      PROCEDURE:  Left total hip replacement through an anterior approach   utilizing DePuy THR system, component size 52 mm pinnacle cup, a size 36+4 neutral   Altrex liner, a size 7 Hi Tri Lock stem with a 36+5 delta ceramic   ball.      SURGEON:  Pietro Cassis. Alvan Dame, M.D.      ASSISTANT:  Nehemiah Massed, PA-C     ANESTHESIA:  Spinal.      SPECIMENS:  None.      COMPLICATIONS:  None.      BLOOD LOSS:  450 cc     DRAINS:  None.      INDICATION OF THE PROCEDURE:  Kimberly Sosa is a 56 y.o. female who had   presented to office for evaluation of left hip pain.  Radiographs revealed   progressive degenerative changes with bone-on-bone   articulation to the  hip joint.  The patient had painful limited range of   motion significantly affecting their overall quality of life.  The patient was failing to    respond to conservative measures, and at this point was ready   to proceed with more definitive measures.  The patient has noted progressive   degenerative changes in his hip, progressive problems and dysfunction   with regarding the hip prior to surgery.  Consent was obtained for   benefit of pain relief.  Specific risk of infection, DVT, component   failure, dislocation, need for revision surgery, as well discussion of   the anterior versus posterior approach were reviewed.  Consent was   obtained for benefit of anterior pain relief through an anterior   approach.      PROCEDURE IN DETAIL:  The patient was brought to operative theater.   Once adequate anesthesia,  preoperative antibiotics, 2gm of Ancef, 1 gm of Tranexamic Acid, and 10 mg of Decadron administered.   The patient was positioned supine on the OSI Hanna table.  Once adequate   padding of boney process was carried out, we had predraped out the hip, and  used fluoroscopy to confirm orientation of the pelvis and position.      The right hip was then prepped and draped from proximal iliac crest to   mid thigh with shower curtain technique.      Time-out was performed identifying the patient, planned procedure, and   extremity.     An incision was then made 2 cm distal and lateral to the   anterior superior iliac spine extending over the orientation of the   tensor fascia lata muscle and sharp dissection was carried down to the   fascia of the muscle and protractor placed in the soft tissues.      The fascia  was then incised.  The muscle belly was identified and swept   laterally and retractor placed along the superior neck.  Following   cauterization of the circumflex vessels and removing some pericapsular   fat, a second cobra retractor was placed on the inferior neck.  A third   retractor was placed on the anterior acetabulum after elevating the   anterior rectus.  A L-capsulotomy was along the line of the   superior neck to the trochanteric fossa, then extended proximally and   distally.  Tag sutures were placed and the retractors were then placed   intracapsular.  We then identified the trochanteric fossa and   orientation of my neck cut, confirmed this radiographically   and then made a neck osteotomy with the femur on traction.  The femoral   head was removed without difficulty or complication.  Traction was let   off and retractors were placed posterior and anterior around the   acetabulum.      The labrum and foveal tissue were debrided.  I began reaming with a 83mm   reamer and reamed up to 46mm reamer with good bony bed preparation and a 41mm   cup was chosen.  The final 28mm  Pinnacle cup was then impacted under fluoroscopy  to confirm the depth of penetration and orientation with respect to   abduction.  A screw was placed followed by the hole eliminator.  The final   36+4 neutral Altrex liner was impacted with good visualized rim fit.  The cup was positioned anatomically within the acetabular portion of the pelvis.      At this point, the femur was rolled at 80 degrees.  Further capsule was   released off the inferior aspect of the femoral neck.  I then   released the superior capsule proximally.  The hook was placed laterally   along the femur and elevated manually and held in position with the bed   hook.  The leg was then extended and adducted with the leg rolled to 100   degrees of external rotation.  Once the proximal femur was fully   exposed, I used a box osteotome to set orientation.  I then began   broaching with the starting chili pepper broach and passed this by hand and then broached up to 7.  With the 7 broach in place I chose a high offset neck and did several trial reductions.  The offset was appropriate, leg lengths   appeared to be equal best matched with the +5 head ball confirmed radiographically.   Given these findings, I went ahead and dislocated the hip, repositioned all   retractors and positioned the right hip in the extended and abducted position.  The final 7 Hi Tri Lock stem was   chosen and it was impacted down to the level of neck cut.  Based on this   and the trial reduction, a 36+5 delta ceramic ball was chosen and   impacted onto a clean and dry trunnion, and the hip was reduced.  The   hip had been irrigated throughout the case again at this point.  I did   reapproximate the superior capsular leaflet to the anterior leaflet   using #1 Vicryl.  The fascia of the   tensor fascia lata muscle was then reapproximated using #1 Vicryl and #0 V-lock sutures.  The   remaining wound was closed with 2-0 Vicryl and running 4-0 Monocryl.    The hip was cleaned, dried,  and dressed sterilely using Dermabond and   Aquacel dressing.  She was then brought   to recovery room in stable condition tolerating the procedure well.    Nehemiah Massed, PA-C was present for the entirety of the case involved from   preoperative positioning, perioperative retractor management, general   facilitation of the case, as well as primary wound closure as assistant.            Pietro Cassis Alvan Dame, M.D.        04/08/2016 12:04 PM

## 2016-04-08 NOTE — Transfer of Care (Signed)
Immediate Anesthesia Transfer of Care Note  Patient: Kimberly Sosa  Procedure(s) Performed: Procedure(s) with comments: LEFT TOTAL HIP ARTHROPLASTY ANTERIOR APPROACH (Left) - Requests 70 mins  Patient Location: PACU  Anesthesia Type:Spinal  Level of Consciousness:  sedated, patient cooperative and responds to stimulation  Airway & Oxygen Therapy:Patient Spontanous Breathing and Patient connected to face mask oxgen  Post-op Assessment:  Report given to PACU RN and Post -op Vital signs reviewed and stable  Post vital signs:  Reviewed and stable  Last Vitals:  Vitals:   04/08/16 0614  BP: 139/82  Pulse: 92  Resp: 18  Temp: [id number] C    Complications: No apparent anesthesia complications

## 2016-04-08 NOTE — Progress Notes (Signed)
Patient is asleep prior to assessing pain.  When aroused patient states pain is a 6.  Patient then falls back to sleep.

## 2016-04-09 LAB — BASIC METABOLIC PANEL
ANION GAP: 6 (ref 5–15)
BUN: 17 mg/dL (ref 6–20)
CALCIUM: 8.9 mg/dL (ref 8.9–10.3)
CO2: 25 mmol/L (ref 22–32)
Chloride: 108 mmol/L (ref 101–111)
Creatinine, Ser: 0.88 mg/dL (ref 0.44–1.00)
Glucose, Bld: 105 mg/dL — ABNORMAL HIGH (ref 65–99)
Potassium: 3.8 mmol/L (ref 3.5–5.1)
Sodium: 139 mmol/L (ref 135–145)

## 2016-04-09 LAB — CBC
HCT: 33 % — ABNORMAL LOW (ref 36.0–46.0)
Hemoglobin: 10.8 g/dL — ABNORMAL LOW (ref 12.0–15.0)
MCH: 29.3 pg (ref 26.0–34.0)
MCHC: 32.7 g/dL (ref 30.0–36.0)
MCV: 89.4 fL (ref 78.0–100.0)
PLATELETS: 313 10*3/uL (ref 150–400)
RBC: 3.69 MIL/uL — ABNORMAL LOW (ref 3.87–5.11)
RDW: 12.8 % (ref 11.5–15.5)
WBC: 16.1 10*3/uL — AB (ref 4.0–10.5)

## 2016-04-09 NOTE — Progress Notes (Signed)
Patient ID: Kimberly Sosa, female   DOB: 25-Dec-1960, 56 y.o.   MRN: [id number] Subjective: 1 Day Post-Op Procedure(s) (LRB): LEFT TOTAL HIP ARTHROPLASTY ANTERIOR APPROACH (Left)    Patient reports pain as mild.   Did well walking yesterday already.  No events.  Ready to get other hip done soon  Objective:   VITALS:   Vitals:   04/09/16 0126 04/09/16 0614  BP: 140/77 (!) 145/81  Pulse: 69 78  Resp: 15 15  Temp: 98.5 F (36.9 C) 98.8 F (37.1 C)    Neurovascular intact Incision: dressing C/D/I  LABS  Recent Labs  04/09/16 0437  HGB 10.8*  HCT 33.0*  WBC 16.1*  PLT 313     Recent Labs  04/09/16 0437  NA 139  K 3.8  BUN 17  CREATININE 0.88  GLUCOSE 105*    No results for input(s): LABPT, INR in the last 72 hours.   Assessment/Plan: 1 Day Post-Op Procedure(s) (LRB): LEFT TOTAL HIP ARTHROPLASTY ANTERIOR APPROACH (Left)   Up with therapy  To home today after therapy and ride arrangements RTC in 2 weeks - appt scheduled Reviewed goals and discussed right hip

## 2016-04-09 NOTE — Progress Notes (Addendum)
OT Note:  Brought tub bench to show pt. She would like to have this. She did not feel she needed to practice this and feels comfortable with ADLs and bathroom transfers.  She used toilet during PT. Will sign off.  Gordon, OTR/L [phone] 04/09/2016

## 2016-04-09 NOTE — Progress Notes (Signed)
Physical Therapy Treatment Patient Details Name: Kimberly Sosa MRN: [id number] DOB: 1960/08/18 Today's Date: 04/09/2016    History of Present Illness s/p L DA THA    PT Comments    Pt progressing well; will see a second time for stair training  Follow Up Recommendations  Other (comment) (needs HHPT, not recommended by surgeon)     Equipment Recommendations  Rolling walker with 5" wheels    Recommendations for Other Services       Precautions / Restrictions Precautions Precautions: Fall Restrictions Weight Bearing Restrictions: No Other Position/Activity Restrictions: WBAT    Mobility  Bed Mobility Overal bed mobility: Needs Assistance Bed Mobility: Supine to Sit     Supine to sit: Min assist     General bed mobility comments: with LLE  Transfers Overall transfer level: Needs assistance Equipment used: Rolling walker (2 wheeled) Transfers: Sit to/from Stand Sit to Stand: Min assist         General transfer comment: min/guard to rise and stabilize, cues for hand placement and LLE position  Ambulation/Gait Ambulation/Gait assistance: Min guard Ambulation Distance (Feet): 50 Feet (10 more) Assistive device: Rolling walker (2 wheeled) Gait Pattern/deviations: Step-to pattern     General Gait Details: cues for sequence and posture   Stairs            Wheelchair Mobility    Modified Rankin (Stroke Patients Only)       Balance                                    Cognition Arousal/Alertness: Awake/alert Behavior During Therapy: WFL for tasks assessed/performed Overall Cognitive Status: Within Functional Limits for tasks assessed                      Exercises Total Joint Exercises Ankle Circles/Pumps: AROM;Both;10 reps Quad Sets: 10 reps;Both;AROM Heel Slides: AROM;Left;10 reps (instructed in use of sheet ) Hip ABduction/ADduction: Left;10 reps;AROM;AAROM;Strengthening    General Comments         Pertinent Vitals/Pain Pain Assessment: 0-10 Pain Score: 7  (reported decr to 4 after amb/ex) Pain Location: L hip Pain Descriptors / Indicators: Sore Pain Intervention(s): Limited activity within patient's tolerance;Monitored during session;Premedicated before session;Repositioned    Home Living Family/patient expects to be discharged to:: Private residence Living Arrangements: Children Available Help at Discharge: Family         Home Equipment: None      Prior Function Level of Independence: Independent          PT Goals (current goals can now be found in the care plan section) Acute Rehab PT Goals Patient Stated Goal: twerk in hallway PT Goal Formulation: With patient Time For Goal Achievement: 04/15/16 Potential to Achieve Goals: Good    Frequency    7X/week      PT Plan Current plan remains appropriate    Co-evaluation             End of Session Equipment Utilized During Treatment: Gait belt Activity Tolerance: Patient tolerated treatment well Patient left: in chair;with call bell/phone within reach;with chair alarm set Nurse Communication: Mobility status PT Visit Diagnosis: Other abnormalities of gait and mobility (R26.89)     Time: 4709-6283 PT Time Calculation (min) (ACUTE ONLY): 31 min  Charges:  $Gait Training: 8-22 mins $Therapeutic Exercise: 8-22 mins  G CodesKenyon Sosa 04/09/2016, 11:36 AM

## 2016-04-09 NOTE — Evaluation (Signed)
Occupational Therapy Evaluation Patient Details Name: Kimberly Sosa MRN: [id number] DOB: [birth year] Today's Date: 04/09/2016    History of Present Illness s/p L DA THA   Clinical Impression   This 56 year old female was admitted for the above. Evaluation was limited by pain. She will benefit from one more session prior to discharge, which is anticipated later today.    Follow Up Recommendations  No OT follow up;Supervision/Assistance - 24 hour    Equipment Recommendations  3 in 1 bedside commode (possibly tub bench:  to bring by later to show her)    Recommendations for Other Services       Precautions / Restrictions Precautions Precautions: Fall Restrictions Other Position/Activity Restrictions: WBAT      Mobility Bed Mobility         Supine to sit: Min assist     General bed mobility comments: A for LLE, in and out of bed  Transfers Overall transfer level: Needs assistance Equipment used: Rolling walker (2 wheeled) Transfers: Sit to/from Stand Sit to Stand: Min assist         General transfer comment: assist to rise and stabilize. Cues for UE/LE placement    Balance                                            ADL Overall ADL's : Needs assistance/impaired     Grooming: Set up;Sitting   Upper Body Bathing: Set up;Sitting   Lower Body Bathing: Minimal assistance;Sit to/from stand;With adaptive equipment   Upper Body Dressing : Set up;Sitting   Lower Body Dressing: Moderate assistance;Sit to/from stand;With adaptive equipment                 General ADL Comments: seen for initial evaluation.  Pt sat EOB and practiced with AE.  Sock aide did not work well for her; tried twice. She will have daughter assist her.  Reacher, long sponge and shoehorn issued. Pt was too painful to use leg lifter.  Educated on DME. Will bring tub bench by later when pain is decreased. Pt sidestepped up [place]      Pertinent Vitals/Pain Pain Score: 8  Pain Location: L hip Pain Descriptors / Indicators: Sore Pain Intervention(s): Limited activity within patient's tolerance;Monitored during session;Premedicated before session;Repositioned;Ice applied     Hand Dominance     Extremity/Trunk Assessment Upper Extremity Assessment Upper Extremity Assessment: Overall WFL for tasks assessed           Communication Communication Communication: No difficulties   Cognition Arousal/Alertness: Awake/alert Behavior During Therapy: WFL for tasks assessed/performed Overall Cognitive Status: Within Functional Limits for tasks assessed                     General Comments       Exercises       Shoulder Instructions      Home Living Family/patient expects to be discharged to:: Private residence Living Arrangements: Children Available Help at Discharge: Family               Bathroom Shower/Tub: Tub/shower unit Shower/tub characteristics: Architectural technologist: Standard     Home Equipment: None          Prior Functioning/Environment Level of Independence: Independent  OT Problem List: Decreased activity tolerance;Pain;Decreased knowledge of use of DME or AE      OT Treatment/Interventions: Self-care/ADL training;DME and/or AE instruction;Patient/family education    OT Goals(Current goals can be found in the care plan section) Acute Rehab OT Goals Patient Stated Goal: be as independent as possible OT Goal Formulation: With patient Time For Goal Achievement: 04/16/16 Potential to Achieve Goals: Good ADL Goals Pt Will Transfer to Toilet: with min guard assist;ambulating;bedside commode Pt Will Perform Toileting - Clothing Manipulation and hygiene: with min guard assist;sit to/from stand Pt Will Perform Tub/Shower Transfer: Tub transfer;with min assist;tub bench  OT Frequency: Min 2X/week   Barriers to D/C:             Co-evaluation              End of Session    Activity Tolerance: Patient limited by pain Patient left: in bed;with call bell/phone within reach  OT Visit Diagnosis: Pain Pain - Right/Left: Left Pain - part of body: Hip                ADL either performed or assessed with clinical judgement  Time: 1031-2811 OT Time Calculation (min): 27 min Charges:  OT General Charges $OT Visit: 1 Procedure OT Evaluation $OT Eval Low Complexity: 1 Procedure OT Treatments $Self Care/Home Management : 8-22 mins G-Codes:     [place], OTR/L [phone] 04/09/2016  Raydel Hosick 04/09/2016, 8:25 AM

## 2016-04-09 NOTE — Care Management Note (Signed)
Case Management Note  Patient Details  Name: Kimberly Sosa MRN: [id number] Date of Birth: 08/25/1960  Subjective/Objective:                  LEFT TOTAL HIP ARTHROPLASTY ANTERIOR APPROACH (Left) Action/Plan: Discharge planning Expected Discharge Date:  04/09/16               Expected Discharge Plan:  Home/Self Care  In-House Referral:     Discharge planning Services  CM Consult  Post Acute Care Choice:  Durable Medical Equipment Choice offered to:  Patient  DME Arranged:  3-N-1, Walker rolling, Tub bench DME Agency:  [place]:  NA [place] Agency:  NA  Status of Service:  Completed, signed off  If discussed at [place] of Stay Meetings, dates discussed:    Additional Comments: CM met with pt in room to confirm plan is for no [facility] services; pt confirms. CM notified [facility] [employer] rep, Joelene Millin to please deliver the rolling walker, 3n1 and tub transfer bench to room prior to discharge. No other CM needs were communicated. Dellie Catholic, RN 04/09/2016, 11:23 AM

## 2016-04-09 NOTE — Progress Notes (Signed)
   04/09/16 1500  PT Visit Information  Last PT Received On 04/09/16  Assistance Needed +1  History of Present Illness s/p L DA THA  Subjective Data  Patient Stated Goal twerk in hallway  Precautions  Precautions Fall  Restrictions  Other Position/Activity Restrictions WBAT  Pain Assessment  Pain Assessment 0-10  Pain Score 6  Pain Location L hip  Pain Descriptors / Indicators Sore  Pain Intervention(s) Limited activity within patient's tolerance;Monitored during session  Cognition  Arousal/Alertness Awake/alert  Behavior During Therapy WFL for tasks assessed/performed  Overall Cognitive Status Within Functional Limits for tasks assessed  Transfers  Overall transfer level Modified independent  Equipment used Rolling walker (2 wheeled)  Transfers Sit to/from Stand  Ambulation/Gait  Ambulation/Gait assistance Supervision  Ambulation Distance (Feet) 60 Feet  Assistive device Rolling walker (2 wheeled)  Gait Pattern/deviations Step-to pattern;Step-through pattern  General Gait Details cues for sequence and posture, stept through and avoiding excessive internal rotation L hip  Stairs Yes  Stairs assistance Min guard  Stair Management One rail Left;With cane;Step to pattern;Forwards  Number of Stairs 3  General stair comments cues for sequence and technique  Total Joint Exercises  Hip ABduction/ADduction Left;Strengthening;5 reps;AROM;Standing  Knee Flexion AROM;Strengthening;Left;5 reps;Standing  Standing Hip Extension AROM;Strengthening;Left;5 reps  Marching in Standing AROM;Strengthening;Left;10 reps  PT - End of Session  Equipment Utilized During Treatment Gait belt  Activity Tolerance Patient tolerated treatment well  Patient left in chair;with call bell/phone within reach  Nurse Communication Mobility status  PT - Assessment/Plan  PT Plan Current plan remains appropriate  PT Visit Diagnosis Other abnormalities of gait and mobility (R26.89)  PT Frequency (ACUTE ONLY)  7X/week  Follow Up Recommendations Other (comment) (needs HHPT, no f/u per MD)  AM-PAC PT "6 Clicks" Daily Activity Outcome Measure  Difficulty turning over in bed (including adjusting bedclothes, sheets and blankets)? 3  Difficulty moving from lying on back to sitting on the side of the bed?  3  Difficulty sitting down on and standing up from a chair with arms (e.g., wheelchair, bedside commode, etc,.)? 3  Help needed moving to and from a bed to chair (including a wheelchair)? 3  Help needed walking in hospital room? 3  Help needed climbing 3-5 steps with a railing?  3  6 Click Score 18  Mobility G Code  CK  PT Goal Progression  Progress towards PT goals Progressing toward goals  Acute Rehab PT Goals  PT Goal Formulation With patient  Time For Goal Achievement 04/15/16  Potential to Achieve Goals Good  PT Time Calculation  PT Start Time (ACUTE ONLY) 1438  PT Stop Time (ACUTE ONLY) 1504  PT Time Calculation (min) (ACUTE ONLY) 26 min  PT General Charges  $$ ACUTE PT VISIT 1 Procedure  PT Treatments  $Gait Training 23-37 mins

## 2016-04-15 NOTE — Discharge Summary (Signed)
Physician Discharge Summary  Patient ID: Kimberly Sosa MRN: [id number] DOB/AGE: 56-24-1962 56 y.o.  Admit date: 04/08/2016 Discharge date: 04/09/2016   Procedures:  Procedure(s) (LRB): LEFT TOTAL HIP ARTHROPLASTY ANTERIOR APPROACH (Left)  Attending Physician:  Dr. Paralee Cancel   Admission Diagnoses:   Left hip primary OA / pain  Discharge Diagnoses:  Principal Problem:   S/P left THA, AA  Past Medical History:  Diagnosis Date  . Active smoker   . Back pain   . DJD (degenerative joint disease), lumbar   . Hypertension   . Kidney stone   . Pinched nerve in neck     HPI:     Kimberly Sosa, 56 y.o. female, has a history of pain and functional disability in the left hip(s) due to arthritis and patient has failed non-surgical conservative treatments for greater than 12 weeks to include NSAID's and/or analgesics, corticosteriod injections, use of assistive devices and activity modification.  Onset of symptoms was gradual starting 2+ years ago with gradually worsening course since that time.The patient noted no past surgery on the left hip(s).  Patient currently rates pain in the left hip at 10 out of 10 with activity. Patient has night pain, worsening of pain with activity and weight bearing, trendelenberg gait, pain that interfers with activities of daily living and pain with passive range of motion. Patient has evidence of periarticular osteophytes and joint space narrowing by imaging studies. This condition presents safety issues increasing the risk of falls. There is no current active infection.   Risks, benefits and expectations were discussed with the patient.  Risks including but not limited to the risk of anesthesia, blood clots, nerve damage, blood vessel damage, failure of the prosthesis, infection and up to and including death.  Patient understand the risks, benefits and expectations and wishes to proceed with surgery.   PCP: Fredia Beets, FNP   Discharged  Condition: good  Hospital Course:  Patient underwent the above stated procedure on 04/08/2016. Patient tolerated the procedure well and brought to the recovery room in good condition and subsequently to the floor.  POD #1 BP: 145/81 ; Pulse: 78 ; Temp: 98.8 F (37.1 C) ; Resp: 15 Patient reports pain as mild.   Did well walking yesterday already.  No events.  Ready to get other hip done soon. Neurovascular intact and incision: dressing C/D/I.  LABS  Basename    HGB     10.8  HCT     33.0    Discharge Exam: General appearance: alert, cooperative and no distress Extremities: Homans sign is negative, no sign of DVT, no edema, redness or tenderness in the calves or thighs and no ulcers, gangrene or trophic changes  Disposition: Home with follow up in 2 weeks   Follow-up Information    Mauri Pole, MD. Schedule an appointment as soon as possible for a visit in 2 week(s).   Specialty:  Orthopedic Surgery Contact information: [address] [place] [address] [phone]        [facility] Follow up.   Why:  rolling walker, 3n1 and tub transfer bench Contact information: [address] [place] [address] [phone]           Discharge Instructions    Call MD / Call 911    Complete by:  As directed    If you experience chest pain or shortness of breath, CALL 911 and be transported to the hospital emergency room.  If you develope a fever above 101 F, pus (white drainage) or increased drainage or redness at the wound, or calf pain, call your surgeon's office.   Constipation Prevention    Complete by:  As directed    Drink plenty of fluids.  Prune juice may be helpful.  You may use a stool softener, such as Colace (over the counter) 100 mg twice a day.  Use MiraLax (over the counter) for constipation as needed.   Diet - low sodium heart healthy    Complete by:  As directed    Discharge instructions    Complete by:  As  directed    INSTRUCTIONS AFTER JOINT REPLACEMENT   Remove items at home which could result in a fall. This includes throw rugs or furniture in walking pathways ICE to the affected joint every three hours while awake for 30 minutes at a time, for at least the first 3-5 days, and then as needed for pain and swelling.  Continue to use ice for pain and swelling. You may notice swelling that will progress down to the foot and ankle.  This is normal after surgery.  Elevate your leg when you are not up walking on it.   Continue to use the breathing machine you got in the hospital (incentive spirometer) which will help keep your temperature down.  It is common for your temperature to cycle up and down following surgery, especially at night when you are not up moving around and exerting yourself.  The breathing machine keeps your lungs expanded and your temperature down.   DIET:  As you were doing prior to hospitalization, we recommend a well-balanced diet.  DRESSING / WOUND CARE / SHOWERING  Keep the surgical dressing until follow up.  The dressing is water proof, so you can shower without any extra covering.  IF THE DRESSING FALLS OFF or the wound gets wet inside, change the dressing with sterile gauze.  Please use good hand washing techniques before changing the dressing.  Do not use any lotions or creams on the incision until instructed by your surgeon.    ACTIVITY  Increase activity slowly as tolerated, but follow the weight bearing instructions below.   No driving for 6 weeks or until further direction given by your physician.  You cannot drive while taking narcotics.  No lifting or carrying greater than 10 lbs. until further directed by your surgeon. Avoid periods of inactivity such as sitting longer than an hour when not asleep. This helps prevent blood clots.  You may return to work once you are authorized by your doctor.     WEIGHT BEARING   Weight bearing as tolerated with assist device  (walker, cane, etc) as directed, use it as long as suggested by your surgeon or therapist, typically at least 4-6 weeks.   EXERCISES  Results after joint replacement surgery are often greatly improved when you follow the exercise, range of motion and muscle strengthening exercises prescribed by your doctor. Safety measures are also important to protect the joint from further injury. Any time any of these exercises cause you to have increased pain or swelling, decrease what you are doing until you are comfortable again and then slowly increase them. If you have problems or questions, call your caregiver or physical therapist for advice.   Rehabilitation is important following a joint replacement. After just a few days of immobilization, the muscles of the leg can become weakened and shrink (atrophy).  These exercises are designed to build  up the tone and strength of the thigh and leg muscles and to improve motion. Often times heat used for twenty to thirty minutes before working out will loosen up your tissues and help with improving the range of motion but do not use heat for the first two weeks following surgery (sometimes heat can increase post-operative swelling).   These exercises can be done on a training (exercise) mat, on the floor, on a table or on a bed. Use whatever works the best and is most comfortable for you.    Use music or television while you are exercising so that the exercises are a pleasant break in your day. This will make your life better with the exercises acting as a break in your routine that you can look forward to.   Perform all exercises about fifteen times, three times per day or as directed.  You should exercise both the operative leg and the other leg as well.   Exercises include:   Quad Sets - Tighten up the muscle on the front of the thigh (Quad) and hold for 5-10 seconds.   Straight Leg Raises - With your knee straight (if you were given a brace, keep it on), lift the  leg to 60 degrees, hold for 3 seconds, and slowly lower the leg.  Perform this exercise against resistance later as your leg gets stronger.  Leg Slides: Lying on your back, slowly slide your foot toward your buttocks, bending your knee up off the floor (only go as far as is comfortable). Then slowly slide your foot back down until your leg is flat on the floor again.  Angel Wings: Lying on your back spread your legs to the side as far apart as you can without causing discomfort.  Hamstring Strength:  Lying on your back, push your heel against the floor with your leg straight by tightening up the muscles of your buttocks.  Repeat, but this time bend your knee to a comfortable angle, and push your heel against the floor.  You may put a pillow under the heel to make it more comfortable if necessary.   A rehabilitation program following joint replacement surgery can speed recovery and prevent re-injury in the future due to weakened muscles. Contact your doctor or a physical therapist for more information on knee rehabilitation.    CONSTIPATION  Constipation is defined medically as fewer than three stools per week and severe constipation as less than one stool per week.  Even if you have a regular bowel pattern at home, your normal regimen is likely to be disrupted due to multiple reasons following surgery.  Combination of anesthesia, postoperative narcotics, change in appetite and fluid intake all can affect your bowels.   YOU MUST use at least one of the following options; they are listed in order of increasing strength to get the job done.  They are all available over the counter, and you may need to use some, POSSIBLY even all of these options:    Drink plenty of fluids (prune juice may be helpful) and high fiber foods Colace 100 mg by mouth twice a day  Senokot for constipation as directed and as needed Dulcolax (bisacodyl), take with full glass of water  Miralax (polyethylene glycol) once or twice  a day as needed.  If you have tried all these things and are unable to have a bowel movement in the first 3-4 days after surgery call either your surgeon or your primary doctor.    If  you experience loose stools or diarrhea, hold the medications until you stool forms back up.  If your symptoms do not get better within 1 week or if they get worse, check with your doctor.  If you experience "the worst abdominal pain ever" or develop nausea or vomiting, please contact the office immediately for further recommendations for treatment.   ITCHING:  If you experience itching with your medications, try taking only a single pain pill, or even half a pain pill at a time.  You can also use Benadryl over the counter for itching or also to help with sleep.   TED HOSE STOCKINGS:  Use stockings on both legs until for at least 2 weeks or as directed by physician office. They may be removed at night for sleeping.  MEDICATIONS:  See your medication summary on the "After Visit Summary" that nursing will review with you.  You may have some home medications which will be placed on hold until you complete the course of blood thinner medication.  It is important for you to complete the blood thinner medication as prescribed.  PRECAUTIONS:  If you experience chest pain or shortness of breath - call 911 immediately for transfer to the hospital emergency department.   If you develop a fever greater that 101 F, purulent drainage from wound, increased redness or drainage from wound, foul odor from the wound/dressing, or calf pain - CONTACT YOUR SURGEON.                                                   FOLLOW-UP APPOINTMENTS:  If you do not already have a post-op appointment, please call the office for an appointment to be seen by your surgeon.  Guidelines for how soon to be seen are listed in your "After Visit Summary", but are typically between 1-4 weeks after surgery.  OTHER INSTRUCTIONS:   Knee Replacement:  Do not  place pillow under knee, focus on keeping the knee straight while resting. CPM instructions: 0-90 degrees, 2 hours in the morning, 2 hours in the afternoon, and 2 hours in the evening. Place foam block, curve side up under heel at all times except when in CPM or when walking.  DO NOT modify, tear, cut, or change the foam block in any way.  MAKE SURE YOU:  Understand these instructions.  Get help right away if you are not doing well or get worse.    Thank you for letting us be a part of your medical care team.  It is a privilege we respect greatly.  We hope these instructions will help you stay on track for a fast and full recovery!   Driving restrictions    Complete by:  As directed    No driving for 2-4 weeks   Increase activity slowly as tolerated    Complete by:  As directed       Allergies as of 04/09/2016      Reactions   Hydrocodone Other (See Comments)   Causes Headaches   Mobic [meloxicam] Other (See Comments)   Causes Headaches   Tetracyclines & Related Swelling      Medication List    STOP taking these medications   ibuprofen 200 MG tablet Commonly known as:  ADVIL,MOTRIN   traMADol 50 MG tablet Commonly known as:  ULTRAM     TAKE these  medications   acetaminophen 500 MG tablet Commonly known as:  TYLENOL Take 1,000 mg by mouth every 8 (eight) hours as needed for mild pain or moderate pain.   aspirin 81 MG chewable tablet Chew 1 tablet (81 mg total) by mouth 2 (two) times daily. Take for 4 weeks.   cyclobenzaprine 10 MG tablet Commonly known as:  FLEXERIL Take 1 tablet (10 mg total) by mouth 3 (three) times daily as needed for muscle spasms.   docusate sodium 100 MG capsule Commonly known as:  COLACE Take 1 capsule (100 mg total) by mouth 2 (two) times daily.   ferrous sulfate 325 (65 FE) MG tablet Commonly known as:  FERROUSUL Take 1 tablet (325 mg total) by mouth 3 (three) times daily with meals.   lisinopril-hydrochlorothiazide 10-12.5 MG  tablet Commonly known as:  PRINZIDE,ZESTORETIC Take 1 tablet by mouth daily.   oxyCODONE 5 MG immediate release tablet Commonly known as:  Oxy IR/ROXICODONE Take 1-2 tablets (5-10 mg total) by mouth every 4 (four) hours as needed for moderate pain or severe pain.   polyethylene glycol packet Commonly known as:  MIRALAX / GLYCOLAX Take 17 g by mouth 2 (two) times daily.        Signed: West Pugh. Xavior Niazi   PA-C  04/15/2016, 8:04 AM

## 2016-04-17 MED FILL — LISINOPRIL-HCTZ 10-12.5 MG: 10-12.5 | 30 days supply | Qty: 30 | Fill #1

## 2016-05-20 MED FILL — LISINOPRIL-HCTZ 10-12.5 MG: 10-12.5 | 30 days supply | Qty: 30 | Fill #2

## 2016-06-16 ENCOUNTER — Other Ambulatory Visit: Payer: Self-pay | Admitting: Family Medicine

## 2016-06-16 DIAGNOSIS — I10 Essential (primary) hypertension: Secondary | ICD-10-CM

## 2016-06-16 MED FILL — LISINOPRIL-HCTZ 10-12.5 MG: 10-12.5 | 30 days supply | Qty: 30 | Fill #0

## 2016-06-17 ENCOUNTER — Telehealth: Payer: Self-pay | Admitting: Family Medicine

## 2016-06-17 NOTE — Telephone Encounter (Signed)
Patient called the office asking to speak with nurse regarding her lab results. Please follow up. ° °Thank you.  °

## 2016-06-17 NOTE — Progress Notes (Signed)
Please place orders in EPIC as patient is being scheduled for a Pre-op appointment! Thank you! 

## 2016-06-19 NOTE — Telephone Encounter (Signed)
CMA call patient to f/up her results  Patient di not answer & no VM set up yet

## 2016-06-27 NOTE — Progress Notes (Signed)
04-01-16 (EPIC)  EKG

## 2016-06-27 NOTE — Patient Instructions (Addendum)
Kimberly Sosa  06/27/2016   Your procedure is scheduled on: 07-08-16   Report to [facility] Main  Entrance Report to Admitting at 9:00 AM   Call this number if you have problems the morning of surgery  [phone]   Remember: ONLY 1 PERSON MAY GO WITH YOU TO SHORT STAY TO GET  READY MORNING OF YOUR SURGERY.  Do not eat food or drink liquids :After Midnight.     Take these medicines the morning of surgery with A SIP OF WATER: None                                You may not have any metal on your body including hair pins and              piercings  Do not wear jewelry, make-up, lotions, powders or perfumes, deodorant             Do not wear nail polish.  Do not shave  48 hours prior to surgery.                 Do not bring valuables to the hospital. [place].  Contacts, dentures or bridgework may not be worn into surgery.  Leave suitcase in the car. After surgery it may be brought to your room.     Please read over the following fact sheets you were given: _____________________________________________________________________             [facility] - Preparing for Surgery Before surgery, you can play an important role.  Because skin is not sterile, your skin needs to be as free of germs as possible.  You can reduce the number of germs on your skin by washing with CHG (chlorahexidine gluconate) soap before surgery.  CHG is an antiseptic cleaner which kills germs and bonds with the skin to continue killing germs even after washing. Please DO NOT use if you have an allergy to CHG or antibacterial soaps.  If your skin becomes reddened/irritated stop using the CHG and inform your nurse when you arrive at Short Stay. Do not shave (including legs and underarms) for at least 48 hours prior to the first CHG shower.  You may shave your face/neck. Please follow these instructions carefully:  1.  Shower with CHG  Soap the night before surgery and the  morning of Surgery.  2.  If you choose to wash your hair, wash your hair first as usual with your  normal  shampoo.  3.  After you shampoo, rinse your hair and body thoroughly to remove the  shampoo.                           4.  Use CHG as you would any other liquid soap.  You can apply chg directly  to the skin and wash                       Gently with a scrungie or clean washcloth.  5.  Apply the CHG Soap to your body ONLY FROM THE NECK DOWN.   Do not use on face/ open  Wound or open sores. Avoid contact with eyes, ears mouth and genitals (private parts).                       Wash face,  Genitals (private parts) with your normal soap.             6.  Wash thoroughly, paying special attention to the area where your surgery  will be performed.  7.  Thoroughly rinse your body with warm water from the neck down.  8.  DO NOT shower/wash with your normal soap after using and rinsing off  the CHG Soap.                9.  Pat yourself dry with a clean towel.            10.  Wear clean pajamas.            11.  Place clean sheets on your bed the night of your first shower and do not  sleep with pets. Day of Surgery : Do not apply any lotions/deodorants the morning of surgery.  Please wear clean clothes to the hospital/surgery center.  FAILURE TO FOLLOW THESE INSTRUCTIONS MAY RESULT IN THE CANCELLATION OF YOUR SURGERY PATIENT SIGNATURE_________________________________  NURSE SIGNATURE__________________________________  ________________________________________________________________________   Adam Phenix  An incentive spirometer is a tool that can help keep your lungs clear and active. This tool measures how well you are filling your lungs with each breath. Taking long deep breaths may help reverse or decrease the chance of developing breathing (pulmonary) problems (especially infection) following:  A long period of time  when you are unable to move or be active. BEFORE THE PROCEDURE   If the spirometer includes an indicator to show your best effort, your nurse or respiratory therapist will set it to a desired goal.  If possible, sit up straight or lean slightly forward. Try not to slouch.  Hold the incentive spirometer in an upright position. INSTRUCTIONS FOR USE  1. Sit on the edge of your bed if possible, or sit up as far as you can in bed or on a chair. 2. Hold the incentive spirometer in an upright position. 3. Breathe out normally. 4. Place the mouthpiece in your mouth and seal your lips tightly around it. 5. Breathe in slowly and as deeply as possible, raising the piston or the ball toward the top of the column. 6. Hold your breath for 3-5 seconds or for as long as possible. Allow the piston or ball to fall to the bottom of the column. 7. Remove the mouthpiece from your mouth and breathe out normally. 8. Rest for a few seconds and repeat Steps 1 through 7 at least 10 times every 1-2 hours when you are awake. Take your time and take a few normal breaths between deep breaths. 9. The spirometer may include an indicator to show your best effort. Use the indicator as a goal to work toward during each repetition. 10. After each set of 10 deep breaths, practice coughing to be sure your lungs are clear. If you have an incision (the cut made at the time of surgery), support your incision when coughing by placing a pillow or rolled up towels firmly against it. Once you are able to get out of bed, walk around indoors and cough well. You may stop using the incentive spirometer when instructed by your caregiver.  RISKS AND COMPLICATIONS  Take your time so you do not get  dizzy or light-headed.  If you are in pain, you may need to take or ask for pain medication before doing incentive spirometry. It is harder to take a deep breath if you are having pain. AFTER USE  Rest and breathe slowly and easily.  It can be  helpful to keep track of a log of your progress. Your caregiver can provide you with a simple table to help with this. If you are using the spirometer at home, follow these instructions: [place] IF:   You are having difficultly using the spirometer.  You have trouble using the spirometer as often as instructed.  Your pain medication is not giving enough relief while using the spirometer.  You develop fever of 100.5 F (38.1 C) or higher. SEEK IMMEDIATE MEDICAL CARE IF:   You cough up bloody sputum that had not been present before.  You develop fever of 102 F (38.9 C) or greater.  You develop worsening pain at or near the incision site. MAKE SURE YOU:   Understand these instructions.  Will watch your condition.  Will get help right away if you are not doing well or get worse. Document Released: 06/02/2006 Document Revised: 04/14/2011 Document Reviewed: 08/03/2006 Exit[employer] Patient Information 2014 Exit[employer], [place].   ________________________________________________________________________  WHAT IS A BLOOD TRANSFUSION? Blood Transfusion Information  A transfusion is the replacement of blood or some of its parts. Blood is made up of multiple cells which provide different functions.  Red blood cells carry oxygen and are used for blood loss replacement.  White blood cells fight against infection.  Platelets control bleeding.  Plasma helps clot blood.  Other blood products are available for specialized needs, such as hemophilia or other clotting disorders. BEFORE THE TRANSFUSION  Who gives blood for transfusions?   Healthy volunteers who are fully evaluated to make sure their blood is safe. This is blood bank blood. Transfusion therapy is the safest it has ever been in the practice of medicine. Before blood is taken from a donor, a complete history is taken to make sure that person has no history of diseases nor engages in risky social behavior (examples are  intravenous drug use or sexual activity with multiple partners). The donor's travel history is screened to minimize risk of transmitting infections, such as malaria. The donated blood is tested for signs of infectious diseases, such as HIV and hepatitis. The blood is then tested to be sure it is compatible with you in order to minimize the chance of a transfusion reaction. If you or a relative donates blood, this is often done in anticipation of surgery and is not appropriate for emergency situations. It takes many days to process the donated blood. RISKS AND COMPLICATIONS Although transfusion therapy is very safe and saves many lives, the main dangers of transfusion include:   Getting an infectious disease.  Developing a transfusion reaction. This is an allergic reaction to something in the blood you were given. Every precaution is taken to prevent this. The decision to have a blood transfusion has been considered carefully by your caregiver before blood is given. Blood is not given unless the benefits outweigh the risks. AFTER THE TRANSFUSION  Right after receiving a blood transfusion, you will usually feel much better and more energetic. This is especially true if your red blood cells have gotten low (anemic). The transfusion raises the level of the red blood cells which carry oxygen, and this usually causes an energy increase.  The nurse administering the transfusion will  monitor you carefully for complications. HOME CARE INSTRUCTIONS  No special instructions are needed after a transfusion. You may find your energy is better. Speak with your caregiver about any limitations on activity for underlying diseases you may have. SEEK MEDICAL CARE IF:   Your condition is not improving after your transfusion.  You develop redness or irritation at the intravenous (IV) site. SEEK IMMEDIATE MEDICAL CARE IF:  Any of the following symptoms occur over the next 12 hours:  Shaking chills.  You have a  temperature by mouth above 102 F (38.9 C), not controlled by medicine.  Chest, back, or muscle pain.  People around you feel you are not acting correctly or are confused.  Shortness of breath or difficulty breathing.  Dizziness and fainting.  You get a rash or develop hives.  You have a decrease in urine output.  Your urine turns a dark color or changes to pink, red, or brown. Any of the following symptoms occur over the next 10 days:  You have a temperature by mouth above 102 F (38.9 C), not controlled by medicine.  Shortness of breath.  Weakness after normal activity.  The white part of the eye turns yellow (jaundice).  You have a decrease in the amount of urine or are urinating less often.  Your urine turns a dark color or changes to pink, red, or brown. Document Released: 01/18/2000 Document Revised: 04/14/2011 Document Reviewed: 09/06/2007 [facility] Patient Information 2014 [place], [place].  _______________________________________________________________________

## 2016-06-27 NOTE — H&P (Signed)
TOTAL HIP ADMISSION H&P  Patient is admitted for right total hip arthroplasty, anterior approach.  Subjective:  Chief Complaint:   Right hip primary OA / pain  HPI: Kimberly Sosa, 56 y.o. female, has a history of pain and functional disability in the right hip(s) due to arthritis and patient has failed non-surgical conservative treatments for greater than 12 weeks to include NSAID's and/or analgesics, use of assistive devices and activity modification.  Onset of symptoms was gradual starting 2+ years ago with gradually worsening course since that time.The patient noted prior procedures of the hip to include arthroplasty on the left hip per Dr. Alvan Dame on 04/08/2016.  Patient currently rates pain in the right hip at 9 out of 10 with activity. Patient has night pain, worsening of pain with activity and weight bearing, trendelenberg gait, pain that interfers with activities of daily living and pain with passive range of motion. Patient has evidence of periarticular osteophytes and joint space narrowing by imaging studies. This condition presents safety issues increasing the risk of falls.   There is no current active infection.   Risks, benefits and expectations were discussed with the patient.  Risks including but not limited to the risk of anesthesia, blood clots, nerve damage, blood vessel damage, failure of the prosthesis, infection and up to and including death.  Patient understand the risks, benefits and expectations and wishes to proceed with surgery.   PCP: Alfonse Spruce, FNP  D/C Plans:       Home  Post-op Meds:       No Rx given   Tranexamic Acid:      To be given - IV  Decadron:      Is to be given  FYI:     ASA  Oxycodone (has a rxn to [facility])  DME:   Pt already has equipment  PT:   No PT    Patient Active Problem List   Diagnosis Date Noted  . 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 Hypertension 03/20/2016   Past Medical History:  Diagnosis Date  . Active smoker   . Back  pain   . DJD (degenerative joint disease), lumbar   . Hypertension   . Kidney stone   . Pinched nerve in neck     Past Surgical History:  Procedure Laterality Date  . APPENDECTOMY    . ROTATOR CUFF REPAIR    . TOTAL HIP ARTHROPLASTY Left 04/08/2016   Procedure: LEFT TOTAL HIP ARTHROPLASTY ANTERIOR APPROACH;  Surgeon: Paralee Cancel, MD;  Location: [facility] ORS;  Service: Orthopedics;  Laterality: Left;  Requests 70 mins    No prescriptions prior to admission.   Allergies  Allergen Reactions  . Hydrocodone Other (See Comments)    Causes Headaches  . Mobic [Meloxicam] Other (See Comments)    Causes Headaches  . Tetracyclines & Related Swelling    All over body swelling    Social History  Substance Use Topics  . Smoking status: Current Every Day Smoker    Types: Cigarettes  . Smokeless tobacco: Never Used  . Alcohol use Yes     Comment: occasional     Family History  Problem Relation Age of Onset  . Cancer Mother   . Diabetes Mother   . Hypertension Mother      Review of Systems  Constitutional: Negative.   HENT: Negative.   Eyes: Negative.   Respiratory: Negative.   Cardiovascular: Negative.   Gastrointestinal: Negative.   Genitourinary: Negative.   Musculoskeletal: Positive  for back pain and joint pain.  Skin: Negative.   Neurological: Positive for tingling (numbness bilateral hands).  Endo/Heme/Allergies: Negative.   Psychiatric/Behavioral: Negative.     Objective:  Physical Exam  Constitutional: She is oriented to person, place, and time. She appears well-developed.  HENT:  Head: Normocephalic.  Eyes: Pupils are equal, round, and reactive to light.  Neck: Neck supple. No JVD present. No tracheal deviation present. No thyromegaly present.  Cardiovascular: Normal rate, regular rhythm and intact distal pulses.   Respiratory: Effort normal and breath sounds normal. No respiratory distress. She has no wheezes.  GI: Soft. There is no tenderness. There is no guarding.   Musculoskeletal:       Right hip: She exhibits decreased range of motion, decreased strength, tenderness and bony tenderness. She exhibits no swelling, no deformity and no laceration.  Lymphadenopathy:    She has no cervical adenopathy.  Neurological: She is alert and oriented to person, place, and time. A sensory deficit (numbness in bilateral hands) is present.  Skin: Skin is warm and dry.  Psychiatric: She has a normal mood and affect.      Labs:  Estimated body mass index is 34 kg/m as calculated from the following:   Height as of 04/08/16: 5' 7.75" (1.721 m).   Weight as of 04/08/16: 100.7 kg (222 lb).   Imaging Review Plain radiographs demonstrate severe degenerative joint disease of the right hip(s). The bone quality appears to be good for age and reported activity level.  Assessment/Plan:  End stage arthritis, right hip(s)  The patient history, physical examination, clinical judgement of the provider and imaging studies are consistent with end stage degenerative joint disease of the right hip(s) and total hip arthroplasty is deemed medically necessary. The treatment options including medical management, injection therapy, arthroscopy and arthroplasty were discussed at length. The risks and benefits of total hip arthroplasty were presented and reviewed. The risks due to aseptic loosening, infection, stiffness, dislocation/subluxation,  thromboembolic complications and other imponderables were discussed.  The patient acknowledged the explanation, agreed to proceed with the plan and consent was signed. Patient is being admitted for inpatient treatment for surgery, pain control, PT, OT, prophylactic antibiotics, VTE prophylaxis, progressive ambulation and ADL's and discharge planning.The patient is planning to be discharged home.      Kimberly Pugh [name]   PA-C  06/27/2016, 2:26 PM

## 2016-07-02 ENCOUNTER — Encounter (HOSPITAL_COMMUNITY): Payer: Self-pay

## 2016-07-02 ENCOUNTER — Encounter (HOSPITAL_COMMUNITY)
Admission: RE | Admit: 2016-07-02 | Discharge: 2016-07-02 | Disposition: A | Discharge status: Home / Self Care | Payer: Medicaid Other | Source: Ambulatory Visit | Attending: Orthopedic Surgery | Admitting: Orthopedic Surgery

## 2016-07-02 DIAGNOSIS — M1611 Unilateral primary osteoarthritis, right hip: Secondary | ICD-10-CM | POA: Insufficient documentation

## 2016-07-02 DIAGNOSIS — Z01812 Encounter for preprocedural laboratory examination: Secondary | ICD-10-CM | POA: Insufficient documentation

## 2016-07-02 LAB — CBC
HCT: 36.8 % (ref 36.0–46.0)
HEMOGLOBIN: 12.3 g/dL (ref 12.0–15.0)
MCH: 29.7 pg (ref 26.0–34.0)
MCHC: 33.4 g/dL (ref 30.0–36.0)
MCV: 88.9 fL (ref 78.0–100.0)
Platelets: 435 10*3/uL — ABNORMAL HIGH (ref 150–400)
RBC: 4.14 MIL/uL (ref 3.87–5.11)
RDW: 14.4 % (ref 11.5–15.5)
WBC: 10.8 10*3/uL — AB (ref 4.0–10.5)

## 2016-07-02 LAB — BASIC METABOLIC PANEL
ANION GAP: 7 (ref 5–15)
BUN: 25 mg/dL — AB (ref 6–20)
CHLORIDE: 109 mmol/L (ref 101–111)
CO2: 26 mmol/L (ref 22–32)
Calcium: 9.6 mg/dL (ref 8.9–10.3)
Creatinine, Ser: 1.2 mg/dL — ABNORMAL HIGH (ref 0.44–1.00)
GFR, EST AFRICAN AMERICAN: 57 mL/min — AB (ref >60)
GFR, EST NON AFRICAN AMERICAN: 50 mL/min — AB (ref >60)
Glucose, Bld: 99 mg/dL (ref 65–99)
POTASSIUM: 4.1 mmol/L (ref 3.5–5.1)
SODIUM: 142 mmol/L (ref 135–145)

## 2016-07-02 LAB — SURGICAL PCR SCREEN
MRSA, PCR: NEGATIVE
STAPHYLOCOCCUS AUREUS: NEGATIVE

## 2016-07-02 NOTE — Progress Notes (Signed)
07-02-16 BMP result, routed to Dr. Alvan Dame for review

## 2016-07-04 ENCOUNTER — Ambulatory Visit: Payer: Medicaid Other | Admitting: Family Medicine

## 2016-07-07 ENCOUNTER — Ambulatory Visit: Payer: Medicaid Other | Admitting: Family Medicine

## 2016-07-08 ENCOUNTER — Inpatient Hospital Stay (HOSPITAL_COMMUNITY): Payer: Medicaid Other

## 2016-07-08 ENCOUNTER — Inpatient Hospital Stay (HOSPITAL_COMMUNITY): Payer: Medicaid Other | Admitting: Anesthesiology

## 2016-07-08 ENCOUNTER — Encounter (HOSPITAL_COMMUNITY): Payer: Self-pay | Admitting: *Deleted

## 2016-07-08 ENCOUNTER — Inpatient Hospital Stay (HOSPITAL_COMMUNITY)
Admission: RE | Admit: 2016-07-08 | Discharge: 2016-07-09 | DRG: 470 | Disposition: A | Discharge status: Home / Self Care | Payer: Medicaid Other | Source: Ambulatory Visit | Attending: Orthopedic Surgery | Admitting: Orthopedic Surgery

## 2016-07-08 ENCOUNTER — Encounter (HOSPITAL_COMMUNITY)
Admission: RE | Disposition: A | Discharge status: Home / Self Care | Payer: Self-pay | Source: Ambulatory Visit | Attending: Orthopedic Surgery

## 2016-07-08 DIAGNOSIS — M62838 Other muscle spasm: Secondary | ICD-10-CM

## 2016-07-08 DIAGNOSIS — Z6833 Body mass index (BMI) 33.0-33.9, adult: Secondary | ICD-10-CM

## 2016-07-08 DIAGNOSIS — Z87442 Personal history of urinary calculi: Secondary | ICD-10-CM

## 2016-07-08 DIAGNOSIS — M1611 Unilateral primary osteoarthritis, right hip: Secondary | ICD-10-CM | POA: Diagnosis present

## 2016-07-08 DIAGNOSIS — Z881 Allergy status to other antibiotic agents status: Secondary | ICD-10-CM | POA: Diagnosis not present

## 2016-07-08 DIAGNOSIS — Z886 Allergy status to analgesic agent status: Secondary | ICD-10-CM

## 2016-07-08 DIAGNOSIS — Z8249 Family history of ischemic heart disease and other diseases of the circulatory system: Secondary | ICD-10-CM

## 2016-07-08 DIAGNOSIS — I10 Essential (primary) hypertension: Secondary | ICD-10-CM | POA: Diagnosis present

## 2016-07-08 DIAGNOSIS — E669 Obesity, unspecified: Secondary | ICD-10-CM | POA: Diagnosis present

## 2016-07-08 DIAGNOSIS — M25551 Pain in right hip: Secondary | ICD-10-CM | POA: Diagnosis present

## 2016-07-08 DIAGNOSIS — Z96649 Presence of unspecified artificial hip joint: Secondary | ICD-10-CM

## 2016-07-08 DIAGNOSIS — Z96641 Presence of right artificial hip joint: Secondary | ICD-10-CM

## 2016-07-08 DIAGNOSIS — Z885 Allergy status to narcotic agent status: Secondary | ICD-10-CM | POA: Diagnosis not present

## 2016-07-08 DIAGNOSIS — F17210 Nicotine dependence, cigarettes, uncomplicated: Secondary | ICD-10-CM | POA: Diagnosis present

## 2016-07-08 HISTORY — PX: TOTAL HIP ARTHROPLASTY: SHX124

## 2016-07-08 LAB — TYPE AND SCREEN
ABO/RH(D): A POS
Antibody Screen: NEGATIVE

## 2016-07-08 SURGERY — ARTHROPLASTY, HIP, TOTAL, ANTERIOR APPROACH
Anesthesia: Spinal | Site: Hip | Laterality: Right

## 2016-07-08 MED ORDER — HYDROMORPHONE HCL 1 MG/ML IJ SOLN
INTRAMUSCULAR | Status: AC
  Filled 2016-07-08: qty 1

## 2016-07-08 MED ORDER — ACETAMINOPHEN 500 MG PO TABS
1000.0000 mg | ORAL_TABLET | Freq: Three times a day (TID) | ORAL | Status: DC
  Administered 2016-07-08 – 2016-07-09 (×3): 1000 mg via ORAL
  Filled 2016-07-08 (×3): qty 2

## 2016-07-08 MED ORDER — POLYETHYLENE GLYCOL 3350 17 G PO PACK: 17.0000 g | PACK | Freq: Two times a day (BID) | ORAL | 0 refills | Status: DC

## 2016-07-08 MED ORDER — CHLORHEXIDINE GLUCONATE 4 % EX LIQD: 60.0000 mL | Freq: Once | CUTANEOUS | Status: DC

## 2016-07-08 MED ORDER — SODIUM CHLORIDE 0.9 % IV SOLN
INTRAVENOUS | Status: DC
  Administered 2016-07-08 (×2): via INTRAVENOUS

## 2016-07-08 MED ORDER — MENTHOL 3 MG MT LOZG: 1.0000 | LOZENGE | OROMUCOSAL | Status: DC | PRN

## 2016-07-08 MED ORDER — MIDAZOLAM HCL 2 MG/2ML IJ SOLN
INTRAMUSCULAR | Status: DC | PRN
  Administered 2016-07-08 (×4): 1 mg via INTRAVENOUS

## 2016-07-08 MED ORDER — METOCLOPRAMIDE HCL 5 MG/ML IJ SOLN: 5.0000 mg | Freq: Three times a day (TID) | INTRAMUSCULAR | Status: DC | PRN

## 2016-07-08 MED ORDER — PHENOL 1.4 % MT LIQD: 1.0000 | OROMUCOSAL | Status: DC | PRN

## 2016-07-08 MED ORDER — DIPHENHYDRAMINE HCL 25 MG PO CAPS: 25.0000 mg | ORAL_CAPSULE | Freq: Four times a day (QID) | ORAL | Status: DC | PRN

## 2016-07-08 MED ORDER — ALUM & MAG HYDROXIDE-SIMETH 200-200-20 MG/5ML PO SUSP: 15.0000 mL | ORAL | Status: DC | PRN

## 2016-07-08 MED ORDER — ASPIRIN 81 MG PO CHEW
81.0000 mg | CHEWABLE_TABLET | Freq: Two times a day (BID) | ORAL | Status: DC
  Administered 2016-07-08 – 2016-07-09 (×2): 81 mg via ORAL
  Filled 2016-07-08 (×2): qty 1

## 2016-07-08 MED ORDER — OXYCODONE HCL 5 MG PO TABS
5.0000 mg | ORAL_TABLET | ORAL | 0 refills | Status: DC | PRN
Start: 2016-07-08 — End: 2016-09-03

## 2016-07-08 MED ORDER — CELECOXIB 200 MG PO CAPS
200.0000 mg | ORAL_CAPSULE | Freq: Two times a day (BID) | ORAL | Status: DC
  Administered 2016-07-08 – 2016-07-09 (×2): 200 mg via ORAL
  Filled 2016-07-08 (×2): qty 1

## 2016-07-08 MED ORDER — DEXAMETHASONE SODIUM PHOSPHATE 10 MG/ML IJ SOLN
10.0000 mg | Freq: Once | INTRAMUSCULAR | Status: AC
Start: 2016-07-09 — End: 2016-07-09
  Administered 2016-07-09: 10:00:00 10 mg via INTRAVENOUS
  Filled 2016-07-08: qty 1

## 2016-07-08 MED ORDER — METHOCARBAMOL 500 MG PO TABS
500.0000 mg | ORAL_TABLET | Freq: Four times a day (QID) | ORAL | Status: DC | PRN
  Administered 2016-07-08 – 2016-07-09 (×2): 500 mg via ORAL
  Filled 2016-07-08 (×3): qty 1

## 2016-07-08 MED ORDER — CEFAZOLIN SODIUM-DEXTROSE 2-4 GM/100ML-% IV SOLN
INTRAVENOUS | Status: AC
  Filled 2016-07-08: qty 100

## 2016-07-08 MED ORDER — LIDOCAINE 2% (20 MG/ML) 5 ML SYRINGE
INTRAMUSCULAR | Status: DC | PRN
  Administered 2016-07-08: 60 mg via INTRAVENOUS

## 2016-07-08 MED ORDER — ARTIFICIAL TEARS OP OINT
TOPICAL_OINTMENT | OPHTHALMIC | Status: AC
  Filled 2016-07-08: qty 3.5

## 2016-07-08 MED ORDER — BUPIVACAINE IN DEXTROSE 0.75-8.25 % IT SOLN
INTRATHECAL | Status: DC | PRN
  Administered 2016-07-08: 2 mL via INTRATHECAL

## 2016-07-08 MED ORDER — POLYETHYLENE GLYCOL 3350 17 G PO PACK
17.0000 g | PACK | Freq: Two times a day (BID) | ORAL | Status: DC
  Administered 2016-07-08 – 2016-07-09 (×2): 17 g via ORAL
  Filled 2016-07-08 (×2): qty 1

## 2016-07-08 MED ORDER — METHOCARBAMOL 1000 MG/10ML IJ SOLN
500.0000 mg | Freq: Four times a day (QID) | INTRAVENOUS | Status: DC | PRN
  Administered 2016-07-08: 500 mg via INTRAVENOUS
  Filled 2016-07-08: qty 550

## 2016-07-08 MED ORDER — MIDAZOLAM HCL 2 MG/2ML IJ SOLN
INTRAMUSCULAR | Status: AC
  Filled 2016-07-08: qty 2

## 2016-07-08 MED ORDER — SODIUM CHLORIDE 0.9 % IR SOLN
Status: DC | PRN
  Administered 2016-07-08: 1000 mL

## 2016-07-08 MED ORDER — ONDANSETRON HCL 4 MG/2ML IJ SOLN
INTRAMUSCULAR | Status: AC
  Filled 2016-07-08: qty 2

## 2016-07-08 MED ORDER — FERROUS SULFATE 325 (65 FE) MG PO TABS: 325.0000 mg | ORAL_TABLET | Freq: Three times a day (TID) | ORAL | Status: DC

## 2016-07-08 MED ORDER — TRANEXAMIC ACID 1000 MG/10ML IV SOLN
1000.0000 mg | Freq: Once | INTRAVENOUS | Status: AC
  Administered 2016-07-08: 17:00:00 1000 mg via INTRAVENOUS
  Filled 2016-07-08: qty 1100

## 2016-07-08 MED ORDER — PROPOFOL 10 MG/ML IV BOLUS
INTRAVENOUS | Status: DC | PRN
  Administered 2016-07-08: 10 mg via INTRAVENOUS
  Administered 2016-07-08 (×3): 20 mg via INTRAVENOUS

## 2016-07-08 MED ORDER — CYCLOBENZAPRINE HCL 10 MG PO TABS: 10.0000 mg | ORAL_TABLET | Freq: Three times a day (TID) | ORAL | 0 refills | Status: DC | PRN

## 2016-07-08 MED ORDER — HYDROMORPHONE HCL 1 MG/ML IJ SOLN
0.2500 mg | INTRAMUSCULAR | Status: DC | PRN
  Administered 2016-07-08 (×3): 0.5 mg via INTRAVENOUS

## 2016-07-08 MED ORDER — ASPIRIN 81 MG PO CHEW: 81.0000 mg | CHEWABLE_TABLET | Freq: Two times a day (BID) | ORAL | 0 refills | Status: DC

## 2016-07-08 MED ORDER — PROPOFOL 10 MG/ML IV BOLUS
INTRAVENOUS | Status: AC
Start: 2016-07-08 — End: 2016-07-08
  Filled 2016-07-08: qty 60

## 2016-07-08 MED ORDER — CEFAZOLIN SODIUM-DEXTROSE 2-4 GM/100ML-% IV SOLN
2.0000 g | Freq: Four times a day (QID) | INTRAVENOUS | Status: AC
  Administered 2016-07-08 (×2): 2 g via INTRAVENOUS
  Filled 2016-07-08 (×2): qty 100

## 2016-07-08 MED ORDER — PROMETHAZINE HCL 25 MG/ML IJ SOLN: 6.2500 mg | INTRAMUSCULAR | Status: DC | PRN

## 2016-07-08 MED ORDER — CEFAZOLIN SODIUM-DEXTROSE 2-4 GM/100ML-% IV SOLN
2.0000 g | INTRAVENOUS | Status: AC
  Administered 2016-07-08: 2 g via INTRAVENOUS

## 2016-07-08 MED ORDER — ONDANSETRON HCL 4 MG/2ML IJ SOLN: 4.0000 mg | Freq: Four times a day (QID) | INTRAMUSCULAR | Status: DC | PRN

## 2016-07-08 MED ORDER — FERROUS SULFATE 325 (65 FE) MG PO TABS
325.0000 mg | ORAL_TABLET | Freq: Three times a day (TID) | ORAL | Status: DC
  Administered 2016-07-09 (×2): 325 mg via ORAL
  Filled 2016-07-08 (×2): qty 1

## 2016-07-08 MED ORDER — TRANEXAMIC ACID 1000 MG/10ML IV SOLN
1000.0000 mg | INTRAVENOUS | Status: AC
  Administered 2016-07-08: 1000 mg via INTRAVENOUS
  Filled 2016-07-08: qty 1100

## 2016-07-08 MED ORDER — LIDOCAINE 2% (20 MG/ML) 5 ML SYRINGE
INTRAMUSCULAR | Status: AC
  Filled 2016-07-08: qty 5

## 2016-07-08 MED ORDER — FENTANYL CITRATE (PF) 100 MCG/2ML IJ SOLN
INTRAMUSCULAR | Status: AC
  Filled 2016-07-08: qty 2

## 2016-07-08 MED ORDER — FENTANYL CITRATE (PF) 100 MCG/2ML IJ SOLN
INTRAMUSCULAR | Status: DC | PRN
  Administered 2016-07-08 (×2): 50 ug via INTRAVENOUS

## 2016-07-08 MED ORDER — METOCLOPRAMIDE HCL 5 MG PO TABS: 5.0000 mg | ORAL_TABLET | Freq: Three times a day (TID) | ORAL | Status: DC | PRN

## 2016-07-08 MED ORDER — ONDANSETRON HCL 4 MG/2ML IJ SOLN
INTRAMUSCULAR | Status: DC | PRN
  Administered 2016-07-08: 4 mg via INTRAVENOUS

## 2016-07-08 MED ORDER — DEXAMETHASONE SODIUM PHOSPHATE 10 MG/ML IJ SOLN
10.0000 mg | Freq: Once | INTRAMUSCULAR | Status: AC
  Administered 2016-07-08: 10 mg via INTRAVENOUS

## 2016-07-08 MED ORDER — DOCUSATE SODIUM 100 MG PO CAPS
100.0000 mg | ORAL_CAPSULE | Freq: Two times a day (BID) | ORAL | 0 refills | Status: DC
Start: 2016-07-08 — End: 2016-09-03

## 2016-07-08 MED ORDER — DEXAMETHASONE SODIUM PHOSPHATE 10 MG/ML IJ SOLN
INTRAMUSCULAR | Status: AC
  Filled 2016-07-08: qty 1

## 2016-07-08 MED ORDER — PROPOFOL 500 MG/50ML IV EMUL
INTRAVENOUS | Status: DC | PRN
  Administered 2016-07-08: 75 ug/kg/min via INTRAVENOUS

## 2016-07-08 MED ORDER — DOCUSATE SODIUM 100 MG PO CAPS
100.0000 mg | ORAL_CAPSULE | Freq: Two times a day (BID) | ORAL | Status: DC
  Administered 2016-07-08 – 2016-07-09 (×2): 100 mg via ORAL
  Filled 2016-07-08 (×2): qty 1

## 2016-07-08 MED ORDER — FENTANYL CITRATE (PF) 100 MCG/2ML IJ SOLN: 25.0000 ug | INTRAMUSCULAR | Status: DC | PRN

## 2016-07-08 MED ORDER — BISACODYL 10 MG RE SUPP: 10.0000 mg | Freq: Every day | RECTAL | Status: DC | PRN

## 2016-07-08 MED ORDER — ONDANSETRON HCL 4 MG PO TABS: 4.0000 mg | ORAL_TABLET | Freq: Four times a day (QID) | ORAL | Status: DC | PRN

## 2016-07-08 MED ORDER — MAGNESIUM CITRATE PO SOLN: 1.0000 | Freq: Once | ORAL | Status: DC | PRN

## 2016-07-08 MED ORDER — HYDRALAZINE HCL 20 MG/ML IJ SOLN
INTRAMUSCULAR | Status: AC
  Filled 2016-07-08: qty 1

## 2016-07-08 MED ORDER — OXYCODONE HCL 5 MG PO TABS
5.0000 mg | ORAL_TABLET | ORAL | Status: DC
  Administered 2016-07-08: 10 mg via ORAL
  Administered 2016-07-08 (×2): 5 mg via ORAL
  Administered 2016-07-09 (×3): 10 mg via ORAL
  Filled 2016-07-08 (×4): qty 2
  Filled 2016-07-08 (×2): qty 1
  Filled 2016-07-08: qty 2

## 2016-07-08 MED ORDER — LACTATED RINGERS IV SOLN
INTRAVENOUS | Status: DC
  Administered 2016-07-08 (×2): via INTRAVENOUS

## 2016-07-08 SURGICAL SUPPLY — 31 items
ADH SKN CLS APL DERMABOND .7 (GAUZE/BANDAGES/DRESSINGS) ×1
BAG SPEC THK2 15X12 ZIP CLS (MISCELLANEOUS)
BAG ZIPLOCK 12X15 (MISCELLANEOUS) IMPLANT
BLADE SAG 18X100X1.27 (BLADE) ×3 IMPLANT
CAPT HIP TOTAL 2 ×2 IMPLANT
CLOTH BEACON ORANGE TIMEOUT ST (SAFETY) ×3 IMPLANT
COVER PERINEAL POST (MISCELLANEOUS) ×3 IMPLANT
COVER SURGICAL LIGHT HANDLE (MISCELLANEOUS) ×3 IMPLANT
DERMABOND ADVANCED (GAUZE/BANDAGES/DRESSINGS) ×2
DERMABOND ADVANCED .7 DNX12 (GAUZE/BANDAGES/DRESSINGS) ×1 IMPLANT
DRAPE STERI IOBAN 125X83 (DRAPES) ×3 IMPLANT
DRAPE U-SHAPE 47X51 STRL (DRAPES) ×6 IMPLANT
DRESSING AQUACEL AG SP 3.5X10 (GAUZE/BANDAGES/DRESSINGS) ×1 IMPLANT
DRSG AQUACEL AG SP 3.5X10 (GAUZE/BANDAGES/DRESSINGS) ×3
DURAPREP 26ML APPLICATOR (WOUND CARE) ×3 IMPLANT
ELECT REM PT RETURN 15FT ADLT (MISCELLANEOUS) ×3 IMPLANT
GLOVE BIOGEL M STRL SZ7.5 (GLOVE) ×2 IMPLANT
GLOVE BIOGEL PI IND STRL 7.5 (GLOVE) ×1 IMPLANT
GLOVE BIOGEL PI INDICATOR 7.5 (GLOVE) ×4
GLOVE ORTHO TXT STRL SZ7.5 (GLOVE) ×3 IMPLANT
GOWN STRL REUS W/TWL LRG LVL3 (GOWN DISPOSABLE) ×3 IMPLANT
GOWN STRL REUS W/TWL XL LVL3 (GOWN DISPOSABLE) ×3 IMPLANT
HOLDER FOLEY CATH W/STRAP (MISCELLANEOUS) ×3 IMPLANT
PACK ANTERIOR HIP CUSTOM (KITS) ×3 IMPLANT
SUT MNCRL AB 4-0 PS2 18 (SUTURE) ×3 IMPLANT
SUT VIC AB 1 CT1 36 (SUTURE) ×9 IMPLANT
SUT VIC AB 2-0 CT1 27 (SUTURE) ×6
SUT VIC AB 2-0 CT1 TAPERPNT 27 (SUTURE) ×2 IMPLANT
SUT VLOC 180 0 24IN GS25 (SUTURE) ×2 IMPLANT
TRAY FOLEY W/METER SILVER 14FR (SET/KITS/TRAYS/PACK) ×2 IMPLANT
YANKAUER SUCT BULB TIP 10FT TU (MISCELLANEOUS) IMPLANT

## 2016-07-08 NOTE — Anesthesia Postprocedure Evaluation (Signed)
Anesthesia Post Note  Patient: Kimberly Sosa  Procedure(s) Performed: Procedure(s) (LRB): RIGHT TOTAL HIP ARTHROPLASTY ANTERIOR APPROACH (Right)     Patient location during evaluation: PACU Anesthesia Type: Spinal Level of consciousness: oriented and awake and alert Pain management: pain level controlled Vital Signs Assessment: post-procedure vital signs reviewed and stable Respiratory status: spontaneous breathing, respiratory function stable and patient connected to nasal cannula oxygen Cardiovascular status: blood pressure returned to baseline and stable Postop Assessment: no headache and no backache Anesthetic complications: no    Last Vitals:  Vitals:   07/08/16 1415 07/08/16 1430  BP: 123/78 122/79  Pulse: 66 66  Resp: 15 12  Temp:      Last Pain:  Vitals:   07/08/16 1415  TempSrc:   PainSc: 6                  Shamar Kracke S

## 2016-07-08 NOTE — Transfer of Care (Signed)
Immediate Anesthesia Transfer of Care Note  Patient: Kimberly Sosa  Procedure(s) Performed: Procedure(s): RIGHT TOTAL HIP ARTHROPLASTY ANTERIOR APPROACH (Right)  Patient Location: PACU  Anesthesia Type:MAC and Spinal  Level of Consciousness: awake, alert , oriented and patient cooperative  Airway & Oxygen Therapy: Patient Spontanous Breathing and Patient connected to face mask oxygen  Post-op Assessment: Report given to RN and Post -op Vital signs reviewed and stable  Post vital signs: Reviewed and stable  Last Vitals:  Vitals:   07/08/16 0920  BP: 125/81  Pulse: 80  Resp: 18  Temp: 36.8 C    Last Pain:  Vitals:   07/08/16 0920  TempSrc: Oral         Complications: No apparent anesthesia complications

## 2016-07-08 NOTE — Anesthesia Procedure Notes (Signed)
Procedure Name: MAC Date/Time: 07/08/2016 11:16 AM Performed by: Dione Booze Pre-anesthesia Checklist: Emergency Drugs available, Suction available, Patient being monitored and Patient identified Patient Re-evaluated:Patient Re-evaluated prior to inductionOxygen Delivery Method: Simple face mask Placement Confirmation: positive ETCO2

## 2016-07-08 NOTE — Anesthesia Procedure Notes (Signed)
Spinal  Patient location during procedure: OR Start time: 07/08/2016 11:23 AM End time: 07/08/2016 11:23 AM Staffing Anesthesiologist: Graceson Nichelson, Iona Beard Performed: anesthesiologist  Preanesthetic Checklist Completed: patient identified, site marked, surgical consent, pre-op evaluation, timeout performed, IV checked, risks and benefits discussed and monitors and equipment checked Spinal Block Patient position: sitting Prep: Betadine Patient monitoring: heart rate, continuous pulse ox and blood pressure Injection technique: single-shot Needle Needle type: Sprotte  Needle gauge: 24 G Needle length: 9 cm Additional Notes Expiration date of kit checked and confirmed. Patient tolerated procedure well, without complications.

## 2016-07-08 NOTE — Interval H&P Note (Signed)
History and Physical Interval Note:  07/08/2016 9:57 AM  Kimberly Sosa  has presented today for surgery, with the diagnosis of right hip osteoarthritis  The various methods of treatment have been discussed with the patient and family. After consideration of risks, benefits and other options for treatment, the patient has consented to  Procedure(s): RIGHT TOTAL HIP ARTHROPLASTY ANTERIOR APPROACH (Right) as a surgical intervention .  The patient's history has been reviewed, patient examined, no change in status, stable for surgery.  I have reviewed the patient's chart and labs.  Questions were answered to the patient's satisfaction.     Mauri Pole

## 2016-07-08 NOTE — Op Note (Signed)
NAME:  Kimberly Sosa.: [id number]      MEDICAL RECORD NO.: [id number]      FACILITY:  [facility]      PHYSICIAN:  Paralee Cancel D  DATE OF BIRTH:  Jul 11, 1960     DATE OF PROCEDURE:  07/08/2016                                 OPERATIVE REPORT         PREOPERATIVE DIAGNOSIS: Right  hip osteoarthritis.      POSTOPERATIVE DIAGNOSIS:  Right hip osteoarthritis.      PROCEDURE:  Right total hip replacement through an anterior approach   utilizing DePuy THR system, component size 24mm pinnacle cup, a size 36+4 neutral   Altrex liner, a size 7 Hi Tri Lock stem with a 36+1.5 delta ceramic   ball.      SURGEON:  Pietro Cassis. Alvan Dame, M.D.      ASSISTANT:  Nehemiah Massed, PA-C     ANESTHESIA:  Spinal.      SPECIMENS:  None.      COMPLICATIONS:  None.      BLOOD LOSS:  900 cc     DRAINS:  None.      INDICATION OF THE PROCEDURE:  Kimberly Sosa is a 56 y.o. female who had   presented to office for evaluation of right hip pain.  Radiographs revealed   progressive degenerative changes with bone-on-bone   articulation to the  hip joint.  The patient had painful limited range of   motion significantly affecting their overall quality of life.  The patient was failing to    respond to conservative measures, and at this point was ready   to proceed with more definitive measures.  The patient has noted progressive   degenerative changes in his hip, progressive problems and dysfunction   with regarding the hip prior to surgery.  Consent was obtained for   benefit of pain relief.  Specific risk of infection, DVT, component   failure, dislocation, need for revision surgery, as well discussion of   the anterior versus posterior approach were reviewed.  Consent was   obtained for benefit of anterior pain relief through an anterior   approach.      PROCEDURE IN DETAIL:  The patient was brought to operative theater.   Once adequate anesthesia,  preoperative antibiotics, 2 gm of Ancef, 1 gm of Tranexamic Acid, and 10 mg of Decadron administered.   The patient was positioned supine on the OSI Hanna table.  Once adequate   padding of boney process was carried out, we had predraped out the hip, and  used fluoroscopy to confirm orientation of the pelvis and position.      The right hip was then prepped and draped from proximal iliac crest to   mid thigh with shower curtain technique.      Time-out was performed identifying the patient, planned procedure, and   extremity.     An incision was then made 2 cm distal and lateral to the   anterior superior iliac spine extending over the orientation of the   tensor fascia lata muscle and sharp dissection was carried down to the   fascia of the muscle and protractor placed in the soft tissues.      The fascia  was then incised.  The muscle belly was identified and swept   laterally and retractor placed along the superior neck.  Following   cauterization of the circumflex vessels and removing some pericapsular   fat, a second cobra retractor was placed on the inferior neck.  A third   retractor was placed on the anterior acetabulum after elevating the   anterior rectus.  A L-capsulotomy was along the line of the   superior neck to the trochanteric fossa, then extended proximally and   distally.  Tag sutures were placed and the retractors were then placed   intracapsular.  We then identified the trochanteric fossa and   orientation of my neck cut, confirmed this radiographically   and then made a neck osteotomy with the femur on traction.  The femoral   head was removed without difficulty or complication.  Traction was let   off and retractors were placed posterior and anterior around the   acetabulum.      The labrum and foveal tissue were debrided.  I began reaming with a 80mm   reamer and reamed up to 51mm reamer with good bony bed preparation and a 9mm   cup was chosen.  The final  58mm Pinnacle cup was then impacted under fluoroscopy  to confirm the depth of penetration and orientation with respect to   abduction.  A screw was placed followed by the hole eliminator.  The final   36+4 neutral Altrex liner was impacted with good visualized rim fit.  The cup was positioned anatomically within the acetabular portion of the pelvis.      At this point, the femur was rolled at 80 degrees.  Further capsule was   released off the inferior aspect of the femoral neck.  I then   released the superior capsule proximally.  The hook was placed laterally   along the femur and elevated manually and held in position with the bed   hook.  The leg was then extended and adducted with the leg rolled to 100   degrees of external rotation.  Once the proximal femur was fully   exposed, I used a box osteotome to set orientation.  I then began   broaching with the starting chili pepper broach and passed this by hand and then broached up to 7.  With the 7 broach in place I chose a high offset neck and did several trial reductions.  The offset was appropriate, leg lengths   appeared to be equal best matched to her left THR with the +1.5 head ball, confirmed radiographically.   Given these findings, I went ahead and dislocated the hip, repositioned all   retractors and positioned the right hip in the extended and abducted position.  The final 7 hi Tri Lock stem was   chosen and it was impacted down to the level of neck cut.  Based on this   and the trial reduction, a 36+1.5 delta ceramic ball was chosen and   impacted onto a clean and dry trunnion, and the hip was reduced.  The   hip had been irrigated throughout the case again at this point.  I did   reapproximate the superior capsular leaflet to the anterior leaflet   using #1 Vicryl.  The fascia of the   tensor fascia lata muscle was then reapproximated using #1 Vicryl and #0 V-lock sutures.  The   remaining wound was closed with 2-0 Vicryl and  running 4-0 Monocryl.   The  hip was cleaned, dried, and dressed sterilely using Dermabond and   Aquacel dressing.  She was then brought   to recovery room in stable condition tolerating the procedure well.    Nehemiah Massed, PA-C was present for the entirety of the case involved from   preoperative positioning, perioperative retractor management, general   facilitation of the case, as well as primary wound closure as assistant.            Pietro Cassis Alvan Dame, M.D.        07/08/2016 12:51 PM

## 2016-07-08 NOTE — Anesthesia Preprocedure Evaluation (Signed)
Anesthesia Evaluation  Patient identified by MRN, date of birth, ID band Patient awake    Reviewed: Allergy & Precautions, NPO status , Patient's Chart, lab work & pertinent test results  Airway Mallampati: II  TM Distance: >3 FB Neck ROM: Full    Dental no notable dental hx.    Pulmonary neg pulmonary ROS, former smoker,    Pulmonary exam normal breath sounds clear to auscultation       Cardiovascular hypertension, Normal cardiovascular exam Rhythm:Regular Rate:Normal     Neuro/Psych negative neurological ROS  negative psych ROS   GI/Hepatic negative GI ROS, Neg liver ROS,   Endo/Other  negative endocrine ROS  Renal/GU negative Renal ROS  negative genitourinary   Musculoskeletal negative musculoskeletal ROS (+)   Abdominal   Peds negative pediatric ROS (+)  Hematology negative hematology ROS (+)   Anesthesia Other Findings   Reproductive/Obstetrics negative OB ROS                             Anesthesia Physical Anesthesia Plan  ASA: II  Anesthesia Plan: Spinal   Post-op Pain Management:    Induction: Intravenous  PONV Risk Score and Plan: 3 and Ondansetron, Dexamethasone, Propofol, Midazolam and Treatment may vary due to age  Airway Management Planned: Simple Face Mask  Additional Equipment:   Intra-op Plan:   Post-operative Plan:   Informed Consent: I have reviewed the patients History and Physical, chart, labs and discussed the procedure including the risks, benefits and alternatives for the proposed anesthesia with the patient or authorized representative who has indicated his/her understanding and acceptance.   Dental advisory given  Plan Discussed with: CRNA and Surgeon  Anesthesia Plan Comments:         Anesthesia Quick Evaluation

## 2016-07-08 NOTE — Discharge Instructions (Signed)

## 2016-07-09 ENCOUNTER — Encounter (HOSPITAL_COMMUNITY): Payer: Self-pay | Admitting: Orthopedic Surgery

## 2016-07-09 DIAGNOSIS — E669 Obesity, unspecified: Secondary | ICD-10-CM | POA: Diagnosis present

## 2016-07-09 LAB — CBC
HEMATOCRIT: 27.3 % — AB (ref 36.0–46.0)
Hemoglobin: 9 g/dL — ABNORMAL LOW (ref 12.0–15.0)
MCH: 29 pg (ref 26.0–34.0)
MCHC: 33 g/dL (ref 30.0–36.0)
MCV: 88.1 fL (ref 78.0–100.0)
PLATELETS: 320 10*3/uL (ref 150–400)
RBC: 3.1 MIL/uL — AB (ref 3.87–5.11)
RDW: 14.5 % (ref 11.5–15.5)
WBC: 14.2 10*3/uL — AB (ref 4.0–10.5)

## 2016-07-09 LAB — BASIC METABOLIC PANEL
ANION GAP: 6 (ref 5–15)
BUN: 19 mg/dL (ref 6–20)
CALCIUM: 8.4 mg/dL — AB (ref 8.9–10.3)
CO2: 24 mmol/L (ref 22–32)
Chloride: 109 mmol/L (ref 101–111)
Creatinine, Ser: 0.8 mg/dL (ref 0.44–1.00)
Glucose, Bld: 116 mg/dL — ABNORMAL HIGH (ref 65–99)
Potassium: 3.6 mmol/L (ref 3.5–5.1)
Sodium: 139 mmol/L (ref 135–145)

## 2016-07-09 NOTE — Evaluation (Signed)
Physical Therapy Evaluation Patient Details Name: Kimberly Sosa MRN: [id number] DOB: [birth year] Today's Date: 07/09/2016   History of Present Illness  Pt is a 56 year old female s/p R DA THA with hx of recent L DA THA  Clinical Impression  Pt is s/p THA resulting in the deficits listed below (see PT Problem List).  Pt will benefit from skilled PT to increase their independence and safety with mobility to allow discharge to the venue listed below.  Pt mobilizing well POD #1 and hopeful to d/c home later today after second session.     Follow Up Recommendations No PT follow up    Equipment Recommendations  None recommended by PT    Recommendations for Other Services       Precautions / Restrictions Precautions Precautions: None Restrictions Other Position/Activity Restrictions: WBAT      Mobility  Bed Mobility Overal bed mobility: Needs Assistance Bed Mobility: Supine to Sit     Supine to sit: Min assist     General bed mobility comments: assist for R LE due to pain  Transfers Overall transfer level: Needs assistance Equipment used: Rolling walker (2 wheeled) Transfers: Sit to/from Stand Sit to Stand: Min guard         General transfer comment: verbal cues for UE and LE positioning  Ambulation/Gait Ambulation/Gait assistance: Min guard Ambulation Distance (Feet): 160 Feet Assistive device: Rolling walker (2 wheeled) Gait Pattern/deviations: Step-through pattern;Decreased stride length;Antalgic     General Gait Details: verbal cues for sequence, RW positioning, posture  Stairs            Wheelchair Mobility    Modified Rankin (Stroke Patients Only)       Balance                                             Pertinent Vitals/Pain Pain Assessment: 0-10 Pain Score: 5  Pain Location: R hip Pain Descriptors / Indicators: Sore;Spasm Pain Intervention(s): Limited activity within patient's tolerance;Monitored during  session;Repositioned (RN brought muscle relaxer)    Home Living Family/patient expects to be discharged to:: Private residence Living Arrangements: Children Available Help at Discharge: Family Type of Home: House Home Access: Stairs to enter   Technical brewer of Steps: 2 Home Layout: One level Home Equipment: Environmental consultant - 2 wheels      Prior Function Level of Independence: Independent               Hand Dominance        Extremity/Trunk Assessment        Lower Extremity Assessment Lower Extremity Assessment: RLE deficits/detail RLE Deficits / Details: anticipated post op hip weakness       Communication   Communication: No difficulties  Cognition Arousal/Alertness: Awake/alert Behavior During Therapy: WFL for tasks assessed/performed Overall Cognitive Status: Within Functional Limits for tasks assessed                                        General Comments      Exercises     Assessment/Plan    PT Assessment Patient needs continued PT services  PT Problem List Decreased strength;Decreased range of motion;Decreased mobility;Pain       PT Treatment Interventions Functional mobility training;Stair training;Gait training;DME instruction;Therapeutic activities;Therapeutic exercise;Patient/family education  PT Goals (Current goals can be found in the Care Plan section)  Acute Rehab PT Goals PT Goal Formulation: With patient Time For Goal Achievement: 07/11/16 Potential to Achieve Goals: Good    Frequency 7X/week   Barriers to discharge        Co-evaluation               AM-PAC PT "6 Clicks" Daily Activity  Outcome Measure Difficulty turning over in bed (including adjusting bedclothes, sheets and blankets)?: None Difficulty moving from lying on back to sitting on the side of the bed? : A Little Difficulty sitting down on and standing up from a chair with arms (e.g., wheelchair, bedside commode, etc,.)?: A Little Help  needed moving to and from a bed to chair (including a wheelchair)?: A Little Help needed walking in hospital room?: A Little Help needed climbing 3-5 steps with a railing? : A Little 6 Click Score: 19    End of Session Equipment Utilized During Treatment: Gait belt Activity Tolerance: Patient tolerated treatment well Patient left: in chair;with call bell/phone within reach;with family/visitor present Nurse Communication: Mobility status PT Visit Diagnosis: Other abnormalities of gait and mobility (R26.89);Pain Pain - Right/Left: Right Pain - part of body: Hip    Time: 1021-1040 PT Time Calculation (min) (ACUTE ONLY): 19 min   Charges:   PT Evaluation $PT Eval Low Complexity: 1 Procedure     PT G Codes:        Carmelia Bake, PT, DPT 07/09/2016 Pager: [phone]  York Ram E 07/09/2016, 11:49 AM

## 2016-07-09 NOTE — Progress Notes (Signed)
Spoke with patient at bedside, no HH needs identified. No PT at d/c,has DME. Requesting to exchange shower chair she purchased from [facility] from last surgery. Contacted [facility], representative states that she would have to return it to the store or contact customer service at [phone].

## 2016-07-09 NOTE — Progress Notes (Signed)
   07/09/16 1400  PT Visit Information  Last PT Received On 07/09/16  Assistance Needed +1  History of Present Illness Pt is a 56 year old female s/p R DA THA with hx of recent L DA THA  Subjective Data  Subjective Pt ambulated in hallway and practiced safe stair technique.  Pt also performed LE exercises and provided with HEP.  Pt feels ready for d/c home today and had no further questions.  Precautions  Precautions None  Restrictions  Other Position/Activity Restrictions WBAT  Pain Assessment  Pain Assessment 0-10  Pain Score 4  Pain Location R hip  Pain Descriptors / Indicators Aching;Sore  Pain Intervention(s) Limited activity within patient's tolerance;Monitored during session;Repositioned  Cognition  Arousal/Alertness Awake/alert  Behavior During Therapy WFL for tasks assessed/performed  Overall Cognitive Status Within Functional Limits for tasks assessed  Transfers  Overall transfer level Needs assistance  Equipment used Rolling walker (2 wheeled)  Transfers Sit to/from Stand  Sit to Stand Supervision  General transfer comment verbal cues for UE and LE positioning  Ambulation/Gait  Ambulation/Gait assistance Supervision  Ambulation Distance (Feet) 200 Feet  Assistive device Rolling walker (2 wheeled)  Gait Pattern/deviations Step-through pattern;Decreased stride length;Antalgic  General Gait Details verbal cues for sequence, RW positioning, posture  Stairs Yes  Stairs assistance Min guard  Stair Management Step to pattern;Forwards;With cane  Number of Stairs 3  General stair comments pt performed with SPC and mimicked "post" which is what will be available.  pt feels comfortable with technique, verbal cues for safety and sequencing and pt reports understanding; performed twice  Exercises  Exercises Total Joint  Total Joint Exercises  Ankle Circles/Pumps AROM;10 reps;Both;Standing (heel raises)  Hip ABduction/ADduction AROM;Right;10 reps;Standing  Long Arc Quad  AROM;Right;10 reps;Seated  Knee Flexion AROM;Right;10 reps;Standing  Marching in Standing AROM;Right;10 reps;Standing  Standing Hip Extension AROM;Right;10 reps;Standing  PT - End of Session  Activity Tolerance Patient tolerated treatment well  Patient left in chair;with call bell/phone within reach;with family/visitor present  PT - Assessment/Plan  PT Plan Current plan remains appropriate  PT Visit Diagnosis Other abnormalities of gait and mobility (R26.89);Pain  Pain - Right/Left Right  Pain - part of body Hip  PT Frequency (ACUTE ONLY) 7X/week  Follow Up Recommendations No PT follow up  PT equipment None recommended by PT  AM-PAC PT "6 Clicks" Daily Activity Outcome Measure  Difficulty turning over in bed (including adjusting bedclothes, sheets and blankets)? 4  Difficulty moving from lying on back to sitting on the side of the bed?  3  Difficulty sitting down on and standing up from a chair with arms (e.g., wheelchair, bedside commode, etc,.)? 3  Help needed moving to and from a bed to chair (including a wheelchair)? 3  Help needed walking in hospital room? 3  Help needed climbing 3-5 steps with a railing?  3  6 Click Score 19  Mobility G Code  CJ  PT Goal Progression  Progress towards PT goals Progressing toward goals  PT Time Calculation  PT Start Time (ACUTE ONLY) 1257  PT Stop Time (ACUTE ONLY) 1324  PT Time Calculation (min) (ACUTE ONLY) 27 min  PT General Charges  $$ ACUTE PT VISIT 1 Procedure  PT Treatments  $Gait Training 8-22 mins  $Therapeutic Exercise 8-22 mins   Carmelia Bake, PT, DPT 07/09/2016 Pager: [phone]

## 2016-07-09 NOTE — Progress Notes (Signed)
     Subjective: 1 Day Post-Op Procedure(s) (LRB): RIGHT TOTAL HIP ARTHROPLASTY ANTERIOR APPROACH (Right)   Patient reports pain as mild, pain controlled. No events throughout the night. Ready to impress [place] with how well she will progress prior to returning to the clinic.  Ready to be discharged home.   Objective:   VITALS:   Vitals:   07/09/16 0132 07/09/16 0601  BP: 120/66 127/68  Pulse: 89 83  Resp: 16 16  Temp: 99.3 F (37.4 C) 98.5 F (36.9 C)    Dorsiflexion/Plantar flexion intact Incision: dressing C/D/I No cellulitis present Compartment soft  LABS  Recent Labs  07/09/16 0509  HGB 9.0*  HCT 27.3*  WBC 14.2*  PLT 320     Recent Labs  07/09/16 0509  NA 139  K 3.6  BUN 19  CREATININE 0.80  GLUCOSE 116*     Assessment/Plan: 1 Day Post-Op Procedure(s) (LRB): RIGHT TOTAL HIP ARTHROPLASTY ANTERIOR APPROACH (Right) Up with therapy Discharge home Follow up in 2 weeks at [facility]. Follow up with OLIN,Ymani Porcher D in 2 weeks.  Contact information:  [facility] [address] [place] [phone]    Obese (BMI 30-39.9) Estimated body mass index is 33.05 kg/m as calculated from the following:   Height as of this encounter: 5\' 7"  (1.702 m).   Weight as of this encounter: 95.7 kg (211 lb). Patient also counseled that weight may inhibit the healing process Patient counseled that losing weight will help with future health issues        West Pugh. Murphy Duzan   PAC  07/09/2016, 8:19 AM

## 2016-07-09 NOTE — Progress Notes (Signed)
OT Cancellation Note  Patient Details Name: NAZIA RHINES MRN: [id number] DOB: [birth year]   Cancelled Treatment:    Reason Eval/Treat Not Completed: OT screened, no needs identified, will sign off. Spoke to pt as CM said her tub bench wasn't working.  In conversation, bench was lopsided--explained that the pins should be adjusted shorter inside of tub to level it out. She will try this.  Takia Runyon 07/09/2016, 10:39 AM  Lesle Chris, OTR/L [phone] 07/09/2016

## 2016-07-10 NOTE — Discharge Summary (Signed)
Physician Discharge Summary  Patient ID: Kimberly Sosa MRN: [id number] DOB/AGE: Feb 19, 1960 56 y.o.  Admit date: 07/08/2016 Discharge date: 07/09/2016   Procedures:  Procedure(s) (LRB): RIGHT TOTAL HIP ARTHROPLASTY ANTERIOR APPROACH (Right)  Attending Physician:  Dr. Paralee Cancel   Admission Diagnoses:   Right hip primary OA / pain  Discharge Diagnoses:  Principal Problem:   S/P left THA, AA Active Problems:   Obese  Past Medical History:  Diagnosis Date  . Active smoker   . Back pain   . DJD (degenerative joint disease), lumbar   . Hypertension   . Kidney stone   . Pinched nerve in neck     HPI:    Kimberly Sosa, 56 y.o. female, has a history of pain and functional disability in the right hip(s) due to arthritis and patient has failed non-surgical conservative treatments for greater than 12 weeks to include NSAID's and/or analgesics, use of assistive devices and activity modification.  Onset of symptoms was gradual starting 2+ years ago with gradually worsening course since that time.The patient noted prior procedures of the hip to include arthroplasty on the left hip per Dr. Alvan Dame on 04/08/2016.  Patient currently rates pain in the right hip at 9 out of 10 with activity. Patient has night pain, worsening of pain with activity and weight bearing, trendelenberg gait, pain that interfers with activities of daily living and pain with passive range of motion. Patient has evidence of periarticular osteophytes and joint space narrowing by imaging studies. This condition presents safety issues increasing the risk of falls.  There is no current active infection.   Risks, benefits and expectations were discussed with the patient.  Risks including but not limited to the risk of anesthesia, blood clots, nerve damage, blood vessel damage, failure of the prosthesis, infection and up to and including death.  Patient understand the risks, benefits and expectations and wishes to proceed with  surgery.   PCP: Alfonse Spruce, FNP   Discharged Condition: good  Hospital Course:  Patient underwent the above stated procedure on 07/08/2016. Patient tolerated the procedure well and brought to the recovery room in good condition and subsequently to the floor.  POD #1 BP: 127/68 ; Pulse: 83 ; Temp: 98.5 F (36.9 C) ; Resp: 16 Patient reports pain as mild, pain controlled. No events throughout the night. Ready to impress [place] with how well she will progress prior to returning to the clinic.  Ready to be discharged home.  Dorsiflexion/plantar flexion intact, incision: dressing C/D/I, no cellulitis present and compartment soft.   LABS  Basename    HGB     9.0  HCT     27.3    Discharge Exam: General appearance: alert, cooperative and no distress Extremities: Homans sign is negative, no sign of DVT, no edema, redness or tenderness in the calves or thighs and no ulcers, gangrene or trophic changes  Disposition: Home with follow up in 2 weeks   Follow-up Information    Paralee Cancel, MD. Schedule an appointment as soon as possible for a visit in 2 week(s).   Specialty:  Orthopedic Surgery Contact information: [address] [place] [address] [phone]        [facility] - Dme Follow up.   Why:  contact to return/exchange shower stool Contact information: [address] [place] [address] [phone]           Discharge Instructions    Call MD / Call 911  Complete by:  As directed    If you experience chest pain or shortness of breath, CALL 911 and be transported to the hospital emergency room.  If you develope a fever above 101 F, pus (white drainage) or increased drainage or redness at the wound, or calf pain, call your surgeon's office.   Change dressing    Complete by:  As directed    Maintain surgical dressing until follow up in the clinic. If the edges start to pull up, may reinforce with tape. If the dressing  is no longer working, may remove and cover with gauze and tape, but must keep the area dry and clean.  Call with any questions or concerns.   Constipation Prevention    Complete by:  As directed    Drink plenty of fluids.  Prune juice may be helpful.  You may use a stool softener, such as Colace (over the counter) 100 mg twice a day.  Use MiraLax (over the counter) for constipation as needed.   Diet - low sodium heart healthy    Complete by:  As directed    Discharge instructions    Complete by:  As directed    Maintain surgical dressing until follow up in the clinic. If the edges start to pull up, may reinforce with tape. If the dressing is no longer working, may remove and cover with gauze and tape, but must keep the area dry and clean.  Follow up in 2 weeks at [facility]. Call with any questions or concerns.   Increase activity slowly as tolerated    Complete by:  As directed    Weight bearing as tolerated with assist device (walker, cane, etc) as directed, use it as long as suggested by your surgeon or therapist, typically at least 4-6 weeks.   TED hose    Complete by:  As directed    Use stockings (TED hose) for 2 weeks on both leg(s).  You may remove them at night for sleeping.      Allergies as of 07/09/2016      Reactions   Hydrocodone Other (See Comments)   Causes Headaches   Mobic [meloxicam] Other (See Comments)   Causes Headaches   Tetracyclines & Related Swelling   All over body swelling      Medication List    STOP taking these medications   acetaminophen 500 MG tablet Commonly known as:  TYLENOL   ibuprofen 200 MG tablet Commonly known as:  ADVIL,MOTRIN   tapentadol 50 MG tablet Commonly known as:  NUCYNTA     TAKE these medications   aspirin 81 MG chewable tablet Chew 1 tablet (81 mg total) by mouth 2 (two) times daily. Take for 4 weeks.   bisacodyl 5 MG EC tablet Commonly known as:  DULCOLAX Take 10 mg by mouth daily as needed for moderate  constipation.   cyclobenzaprine 10 MG tablet Commonly known as:  FLEXERIL Take 1 tablet (10 mg total) by mouth 3 (three) times daily as needed for muscle spasms.   docusate sodium 100 MG capsule Commonly known as:  COLACE Take 1 capsule (100 mg total) by mouth 2 (two) times daily.   ferrous sulfate 325 (65 FE) MG tablet Commonly known as:  FERROUSUL Take 1 tablet (325 mg total) by mouth 3 (three) times daily with meals.   lisinopril-hydrochlorothiazide 10-12.5 MG tablet Commonly known as:  PRINZIDE,ZESTORETIC TAKE 1 TABLET BY MOUTH DAILY.   oxyCODONE 5 MG immediate release tablet Commonly known as:  Oxy IR/ROXICODONE Take 1-3 tablets (5-15 mg total) by mouth every 4 (four) hours as needed for moderate pain or severe pain. What changed:  how much to take   polyethylene glycol packet Commonly known as:  MIRALAX / GLYCOLAX Take 17 g by mouth 2 (two) times daily.        Signed: [place]. Damarien Nyman   PA-C  07/10/2016, 5:43 PM

## 2016-07-23 ENCOUNTER — Encounter: Payer: Self-pay | Admitting: Gastroenterology

## 2016-07-23 ENCOUNTER — Encounter: Payer: Self-pay | Admitting: Family Medicine

## 2016-07-23 ENCOUNTER — Ambulatory Visit: Payer: Medicaid Other | Attending: Family Medicine | Admitting: Family Medicine

## 2016-07-23 VITALS — BP 113/76 | HR 101 | Temp 98.4°F | Resp 18 | Ht 67.0 in | Wt 214.6 lb

## 2016-07-23 DIAGNOSIS — E785 Hyperlipidemia, unspecified: Secondary | ICD-10-CM | POA: Diagnosis not present

## 2016-07-23 DIAGNOSIS — Z96641 Presence of right artificial hip joint: Secondary | ICD-10-CM | POA: Insufficient documentation

## 2016-07-23 DIAGNOSIS — Z7982 Long term (current) use of aspirin: Secondary | ICD-10-CM | POA: Insufficient documentation

## 2016-07-23 DIAGNOSIS — Z79899 Other long term (current) drug therapy: Secondary | ICD-10-CM | POA: Diagnosis not present

## 2016-07-23 DIAGNOSIS — D509 Iron deficiency anemia, unspecified: Secondary | ICD-10-CM | POA: Diagnosis not present

## 2016-07-23 DIAGNOSIS — F17200 Nicotine dependence, unspecified, uncomplicated: Secondary | ICD-10-CM | POA: Diagnosis not present

## 2016-07-23 DIAGNOSIS — Z0000 Encounter for general adult medical examination without abnormal findings: Secondary | ICD-10-CM

## 2016-07-23 DIAGNOSIS — I10 Essential (primary) hypertension: Secondary | ICD-10-CM | POA: Diagnosis not present

## 2016-07-23 DIAGNOSIS — E669 Obesity, unspecified: Secondary | ICD-10-CM | POA: Insufficient documentation

## 2016-07-23 MED ORDER — NICOTINE 21 MG/24HR TD PT24: 21.0000 mg | MEDICATED_PATCH | Freq: Every day | TRANSDERMAL | 1 refills | Status: DC

## 2016-07-23 MED ORDER — LISINOPRIL-HYDROCHLOROTHIAZIDE 10-12.5 MG PO TABS: 1.0000 | ORAL_TABLET | Freq: Every day | ORAL | 0 refills | Status: DC

## 2016-07-23 MED ORDER — ACETAMINOPHEN 500 MG PO TABS: 1000.0000 mg | ORAL_TABLET | Freq: Four times a day (QID) | ORAL | 0 refills | Status: DC | PRN

## 2016-07-23 MED ORDER — FERROUS SULFATE 325 (65 FE) MG PO TABS: 325.0000 mg | ORAL_TABLET | Freq: Three times a day (TID) | ORAL | 2 refills | Status: DC

## 2016-07-23 MED FILL — LISINOPRIL-HCTZ 10-12.5 MG: 10-12.5 | 30 days supply | Qty: 30 | Fill #0

## 2016-07-23 MED FILL — FERROUS SULFATE 325 MG TAB: 325 (65 FE) | 30 days supply | Qty: 90 | Fill #0

## 2016-07-23 NOTE — Patient Instructions (Addendum)
Schedule lab appointment   Hypertension Hypertension is another name for high blood pressure. High blood pressure forces your heart to work harder to pump blood. This can cause problems over time. There are two numbers in a blood pressure reading. There is a top number (systolic) over a bottom number (diastolic). It is best to have a blood pressure below 120/80. Healthy choices can help lower your blood pressure. You may need medicine to help lower your blood pressure if:  Your blood pressure cannot be lowered with healthy choices.  Your blood pressure is higher than 130/80.  Follow these instructions at home: Eating and drinking  If directed, follow the DASH eating plan. This diet includes: ? Filling half of your plate at each meal with fruits and vegetables. ? Filling one quarter of your plate at each meal with whole grains. Whole grains include whole wheat pasta, brown rice, and whole grain bread. ? Eating or drinking low-fat dairy products, such as skim milk or low-fat yogurt. ? Filling one quarter of your plate at each meal with low-fat (lean) proteins. Low-fat proteins include fish, skinless chicken, eggs, beans, and tofu. ? Avoiding fatty meat, cured and processed meat, or chicken with skin. ? Avoiding premade or processed food.  Eat less than 1,500 mg of salt (sodium) a day.  Limit alcohol use to no more than 1 drink a day for nonpregnant women and 2 drinks a day for men. One drink equals 12 oz of beer, 5 oz of wine, or 1 oz of hard liquor. Lifestyle  Work with your doctor to stay at a healthy weight or to lose weight. Ask your doctor what the best weight is for you.  Get at least 30 minutes of exercise that causes your heart to beat faster (aerobic exercise) most days of the week. This may include walking, swimming, or biking.  Get at least 30 minutes of exercise that strengthens your muscles (resistance exercise) at least 3 days a week. This may include lifting weights or  pilates.  Do not use any products that contain nicotine or tobacco. This includes cigarettes and e-cigarettes. If you need help quitting, ask your doctor.  Check your blood pressure at home as told by your doctor.  Keep all follow-up visits as told by your doctor. This is important. Medicines  Take over-the-counter and prescription medicines only as told by your doctor. Follow directions carefully.  Do not skip doses of blood pressure medicine. The medicine does not work as well if you skip doses. Skipping doses also puts you at risk for problems.  Ask your doctor about side effects or reactions to medicines that you should watch for. Contact a doctor if:  You think you are having a reaction to the medicine you are taking.  You have headaches that keep coming back (recurring).  You feel dizzy.  You have swelling in your ankles.  You have trouble with your vision. Get help right away if:  You get a very bad headache.  You start to feel confused.  You feel weak or numb.  You feel faint.  You get very bad pain in your: ? Chest. ? Belly (abdomen).  You throw up (vomit) more than once.  You have trouble breathing. Summary  Hypertension is another name for high blood pressure.  Making healthy choices can help lower blood pressure. If your blood pressure cannot be controlled with healthy choices, you may need to take medicine. This information is not intended to replace advice given to  you by your health care provider. Make sure you discuss any questions you have with your health care provider. Document Released: 07/09/2007 Document Revised: 12/19/2015 Document Reviewed: 12/19/2015 Elsevier Interactive Patient Education  2018 [employer].  Nicotine skin patches What is this medicine? NICOTINE ([place]) helps people stop smoking. The patches replace the nicotine found in cigarettes and help to decrease withdrawal effects. They are most effective when used in  combination with a stop-smoking program. This medicine may be used for other purposes; ask your health care provider or pharmacist if you have questions. COMMON BRAND NAME(S): Habitrol, Nicoderm CQ, Nicotrol What should I tell my health care provider before I take this medicine? They need to know if you have any of these conditions: -diabetes -heart disease, angina, irregular heartbeat or previous heart attack -high blood pressure -lung disease, including asthma -overactive thyroid -pheochromocytoma -seizures or a history of seizures -skin problems, like eczema -stomach problems or ulcers -an unusual or allergic reaction to nicotine, adhesives, other medicines, foods, dyes, or preservatives -pregnant or trying to get pregnant -breast-feeding How should I use this medicine? This medicine is for use on the skin. Follow the directions that come with the patches. Find an area of skin on your upper arm, chest, or back that is clean, dry, greaseless, undamaged and hairless. Wash hands with plain soap and water. Do not use anything that contains aloe, lanolin or glycerin as these may prevent the patch from sticking. Dry thoroughly. Remove the patch from the sealed pouch. Do not try to cut or trim the patch. Using your palm, press the patch firmly in place for 10 seconds to make sure that there is good contact with your skin. After applying the patch, wash your hands. Change the patch every day, keeping to a regular schedule. When you apply a new patch, use a new area of skin. Wait at least 1 week before using the same area again. Talk to your pediatrician regarding the use of this medicine in children. Special care may be needed. Overdosage: If you think you have taken too much of this medicine contact a poison control center or emergency room at once. NOTE: This medicine is only for you. Do not share this medicine with others. What if I miss a dose? If you forget to replace a patch, use it as soon  as you can. Only use one patch at a time and do not leave on the skin for longer than directed. If a patch falls off, you can replace it, but keep to your schedule and remove the patch at the right time. What may interact with this medicine? -medicines for asthma -medicines for blood pressure -medicines for mental depression This list may not describe all possible interactions. Give your health care provider a list of all the medicines, herbs, non-prescription drugs, or dietary supplements you use. Also tell them if you smoke, drink alcohol, or use illegal drugs. Some items may interact with your medicine. What should I watch for while using this medicine? You should begin using the nicotine patch the day you stop smoking. It is okay if you do not succeed at your attempt to quit and have a cigarette. You can still continue your quit attempt and keep using the product as directed. Just throw away your cigarettes and get back to your quit plan. You can keep the patch in place during swimming, bathing, and showering. If your patch falls off during these activities, replace it. When you first apply the  patch, your skin may itch or burn. This should go away soon. When you remove a patch, the skin may look red, but this should only last for a few days. Call your doctor or health care professional if skin redness does not go away after 4 days, if your skin swells, or if you get a rash. If you are a diabetic and you quit smoking, the effects of insulin may be increased and you may need to reduce your insulin dose. Check with your doctor or health care professional about how you should adjust your insulin dose. If you are going to have a magnetic resonance imaging (MRI) procedure, tell your MRI technician if you have this patch on your body. It must be removed before a MRI. What side effects may I notice from receiving this medicine? Side effects that you should report to your doctor or health care professional  as soon as possible: -allergic reactions like skin rash, itching or hives, swelling of the face, lips, or tongue -breathing problems -changes in hearing -changes in vision -chest pain -cold sweats -confusion -fast, irregular heartbeat -feeling faint or lightheaded, falls -headache -increased saliva -skin redness that lasts more than 4 days -stomach pain -signs and symptoms of nicotine overdose like nausea; vomiting; dizziness; weakness; and rapid heartbeat Side effects that usually do not require medical attention (report to your doctor or health care professional if they continue or are bothersome): -diarrhea -dry mouth -hiccups -irritability -nervousness or restlessness -trouble sleeping or vivid dreams This list may not describe all possible side effects. Call your doctor for medical advice about side effects. You may report side effects to FDA at 1-800-FDA-1088. Where should I keep my medicine? Keep out of the reach of children. Store at room temperature between 20 and 25 degrees C (68 and 77 degrees F). Protect from heat and light. Store in International aid/development worker until ready to use. Throw away unused medicine after the expiration date. When you remove a patch, fold with sticky sides together; put in an empty opened pouch and throw away. NOTE: This sheet is a summary. It may not cover all possible information. If you have questions about this medicine, talk to your doctor, pharmacist, or health care provider.  2018 Elsevier/Gold Standard (2013-12-19 15:46:21)

## 2016-07-23 NOTE — Progress Notes (Signed)
Subjective:  Patient ID: Kimberly Sosa, female    DOB: Jun 14, 1960  Age: 56 y.o. MRN: [id number]  CC: Hypertension   HPI Kimberly Sosa presents for hypetension follow up. She is exercising and is not adherent to low salt diet. She does not check BP at home. Cardiac symptoms none. Patient denies chest pain, chest pressure/discomfort, claudication, dyspnea, fatigue, near-syncope, palpitations and syncope.  Cardiovascular risk factors: dyslipidemia, hypertension, obesity (BMI >= 30 kg/m2) and smoking/ tobacco exposure. She reports 20 year smoking history. She currently smokes 3-5 cigarettes per day. She is ready to quit. Use of agents associated with hypertension: none. History of target organ damage: none. History of right hip replacement on 07/08/2016. She denies any redness, drainage, or swelling at the site. She ambulates with assistance of walker. Previous CBC showed IDA was prescribed a iron supplement. She denies picking up prescription.    Outpatient Medications Prior to Visit  Medication Sig Dispense Refill  . aspirin 81 MG chewable tablet Chew 1 tablet (81 mg total) by mouth 2 (two) times daily. Take for 4 weeks. 60 tablet 0  . bisacodyl (DULCOLAX) 5 MG EC tablet Take 10 mg by mouth daily as needed for moderate constipation.    . cyclobenzaprine (FLEXERIL) 10 MG tablet Take 1 tablet (10 mg total) by mouth 3 (three) times daily as needed for muscle spasms. 30 tablet 0  . docusate sodium (COLACE) 100 MG capsule Take 1 capsule (100 mg total) by mouth 2 (two) times daily. 10 capsule 0  . oxyCODONE (OXY IR/ROXICODONE) 5 MG immediate release tablet Take 1-3 tablets (5-15 mg total) by mouth every 4 (four) hours as needed for moderate pain or severe pain. 80 tablet 0  . polyethylene glycol (MIRALAX / GLYCOLAX) packet Take 17 g by mouth 2 (two) times daily. 14 each 0  . ferrous sulfate (FERROUSUL) 325 (65 FE) MG tablet Take 1 tablet (325 mg total) by mouth 3 (three) times daily with meals.      Marland Kitchen lisinopril-hydrochlorothiazide (PRINZIDE,ZESTORETIC) 10-12.5 MG tablet TAKE 1 TABLET BY MOUTH DAILY. 30 tablet 0   No facility-administered medications prior to visit.     ROS Review of Systems  Constitutional: Negative.   Eyes: Negative.   Respiratory: Negative.   Cardiovascular: Negative.   Gastrointestinal: Negative.   Musculoskeletal: Positive for myalgias.  Skin:       Surgical incision    Objective:  BP 113/76 (BP Location: Left Arm, Patient Position: Sitting, Cuff Size: Normal)   Pulse (!) 101   Temp 98.4 F (36.9 C) (Oral)   Resp 18   Ht 5\' 7"  (1.702 m)   Wt 214 lb 9.6 oz (97.3 kg)   SpO2 100%   BMI 33.61 kg/m   BP/Weight 07/23/2016 06/11/5636 08/08/6431  Systolic BP 295 188 -  Diastolic BP 76 71 -  Wt. (Lbs) 214.6 - 211  BMI 33.61 - 33.05     Physical Exam  Constitutional: She appears well-developed and well-nourished.  Eyes: Conjunctivae are normal. Pupils are equal, round, and reactive to light.  Neck: No JVD present.  Cardiovascular: Normal rate, regular rhythm, normal heart sounds and intact distal pulses.   Pulmonary/Chest: Effort normal and breath sounds normal.  Abdominal: Soft. Bowel sounds are normal.  Musculoskeletal:  tenderness to right lateral hip.  Skin: Skin is warm and dry.  Surgical incision to the right lateral hip, bonded with skin glue. Edges are approximated, no drainage present, no redness, or swelling.   Nursing note and vitals  reviewed.   Assessment & Plan:   Problem List Items Addressed This Visit      Cardiovascular and Mediastinum   Hypertension - Primary   Relevant Medications   lisinopril-hydrochlorothiazide (PRINZIDE,ZESTORETIC) 10-12.5 MG tablet   Other Relevant Orders   Lipid Panel    Other Visit Diagnoses    Ready to quit smoking       Relevant Medications   nicotine (NICODERM CQ) 21 mg/24hr patch   Iron deficiency anemia, unspecified iron deficiency anemia type       Relevant Medications   ferrous sulfate  (FERROUSUL) 325 (65 FE) MG tablet   Other Relevant Orders   CBC with Differential   History of right hip replacement       Relevant Medications   acetaminophen (TYLENOL) 500 MG tablet   Healthcare maintenance       Relevant Orders   Hepatitis C Antibody   HIV antibody (with reflex)   MM SCREENING BREAST TOMO BILATERAL   Ambulatory referral to Gastroenterology      Meds ordered this encounter  Medications  . ferrous sulfate (FERROUSUL) 325 (65 FE) MG tablet    Sig: Take 1 tablet (325 mg total) by mouth 3 (three) times daily with meals.    Dispense:  90 tablet    Refill:  2    Order Specific Question:   Supervising Provider    Answer:   Tresa Garter [id number]  . lisinopril-hydrochlorothiazide (PRINZIDE,ZESTORETIC) 10-12.5 MG tablet    Sig: Take 1 tablet by mouth daily.    Dispense:  90 tablet    Refill:  0    Order Specific Question:   Supervising Provider    Answer:   Tresa Garter [id number]  . acetaminophen (TYLENOL) 500 MG tablet    Sig: Take 2 tablets (1,000 mg total) by mouth every 6 (six) hours as needed for moderate pain.    Dispense:  30 tablet    Refill:  0    Order Specific Question:   Supervising Provider    Answer:   Tresa Garter [id number]  . nicotine (NICODERM CQ) 21 mg/24hr patch    Sig: Place 1 patch (21 mg total) onto the skin daily.    Dispense:  28 patch    Refill:  1    Order Specific Question:   Supervising Provider    Answer:   Tresa Garter [id number]    Follow-up: Return in about 2 weeks (around 08/06/2016) for PAP.   Alfonse Spruce FNP

## 2016-07-23 NOTE — Progress Notes (Signed)
Patient is here for BP & medication BP refill  Without BP medication since the day before yesterday   Patient is requesting medication to stop smoking

## 2016-08-20 MED FILL — LISINOPRIL-HCTZ 10-12.5 MG: 10-12.5 | 30 days supply | Qty: 30 | Fill #1

## 2016-08-26 ENCOUNTER — Other Ambulatory Visit: Payer: Self-pay | Admitting: Family Medicine

## 2016-08-26 ENCOUNTER — Ambulatory Visit: Payer: Medicaid Other | Attending: Family Medicine | Admitting: Family Medicine

## 2016-08-26 ENCOUNTER — Encounter: Payer: Self-pay | Admitting: Family Medicine

## 2016-08-26 ENCOUNTER — Other Ambulatory Visit: Payer: Self-pay | Admitting: *Deleted

## 2016-08-26 ENCOUNTER — Other Ambulatory Visit (HOSPITAL_COMMUNITY)
Admission: RE | Admit: 2016-08-26 | Discharge: 2016-08-26 | Disposition: A | Discharge status: Home / Self Care | Payer: Medicaid Other | Source: Ambulatory Visit | Attending: Family Medicine | Admitting: Family Medicine

## 2016-08-26 VITALS — BP 112/72 | HR 74 | Temp 97.6°F | Resp 18 | Ht 67.0 in | Wt 225.0 lb

## 2016-08-26 DIAGNOSIS — Z01419 Encounter for gynecological examination (general) (routine) without abnormal findings: Secondary | ICD-10-CM | POA: Diagnosis not present

## 2016-08-26 DIAGNOSIS — G5603 Carpal tunnel syndrome, bilateral upper limbs: Secondary | ICD-10-CM | POA: Insufficient documentation

## 2016-08-26 DIAGNOSIS — Z803 Family history of malignant neoplasm of breast: Secondary | ICD-10-CM | POA: Insufficient documentation

## 2016-08-26 DIAGNOSIS — Z0000 Encounter for general adult medical examination without abnormal findings: Secondary | ICD-10-CM

## 2016-08-26 DIAGNOSIS — I10 Essential (primary) hypertension: Secondary | ICD-10-CM | POA: Insufficient documentation

## 2016-08-26 DIAGNOSIS — Z96641 Presence of right artificial hip joint: Secondary | ICD-10-CM | POA: Insufficient documentation

## 2016-08-26 DIAGNOSIS — D509 Iron deficiency anemia, unspecified: Secondary | ICD-10-CM | POA: Diagnosis present

## 2016-08-26 DIAGNOSIS — Z96649 Presence of unspecified artificial hip joint: Secondary | ICD-10-CM

## 2016-08-26 DIAGNOSIS — N898 Other specified noninflammatory disorders of vagina: Secondary | ICD-10-CM | POA: Diagnosis not present

## 2016-08-26 DIAGNOSIS — F17200 Nicotine dependence, unspecified, uncomplicated: Secondary | ICD-10-CM | POA: Insufficient documentation

## 2016-08-26 DIAGNOSIS — Z7982 Long term (current) use of aspirin: Secondary | ICD-10-CM | POA: Insufficient documentation

## 2016-08-26 DIAGNOSIS — Z1159 Encounter for screening for other viral diseases: Secondary | ICD-10-CM

## 2016-08-26 MED ORDER — ACETAMINOPHEN 500 MG PO TABS: 1000.0000 mg | ORAL_TABLET | Freq: Four times a day (QID) | ORAL | 0 refills | Status: DC | PRN

## 2016-08-26 NOTE — Progress Notes (Signed)
Patient is here for PAP  Patient complains about hip pain  Patient stated that when sitting legs get overwhelm

## 2016-08-26 NOTE — Progress Notes (Signed)
Subjective:  Patient ID: Kimberly Sosa, female    DOB: 02/09/1960  Age: 57 y.o. MRN: [id number]  CC: Gynecologic Exam   HPI Kimberly Sosa presents for presents for well woman visit with gynecological examination. She reports family history breast cancer- MGM. She denies family history of cervical cancer. She denies any lumps, nipple discharge, denting or dimpling of the breast. She does perform monthly SBE sometimes. She is a smoker five cigarettes per day.She declinesSTI testing with examination. She does report yellow-like vaginal discharge. She denies any vaginal lesionsor dysuria. She is requesting referral to orthopedic for history of carpal tunnel syndrome and "pinched" nerves. Symptoms include bilateral wrist paresthesias and numbness of the 4th and 5th digits of the left hand.   Outpatient Medications Prior to Visit  Medication Sig Dispense Refill  . lisinopril-hydrochlorothiazide (PRINZIDE,ZESTORETIC) 10-12.5 MG tablet Take 1 tablet by mouth daily. 90 tablet 0  . aspirin 81 MG chewable tablet Chew 1 tablet (81 mg total) by mouth 2 (two) times daily. Take for 4 weeks. 60 tablet 0  . bisacodyl (DULCOLAX) 5 MG EC tablet Take 10 mg by mouth daily as needed for moderate constipation.    . cyclobenzaprine (FLEXERIL) 10 MG tablet Take 1 tablet (10 mg total) by mouth 3 (three) times daily as needed for muscle spasms. 30 tablet 0  . docusate sodium (COLACE) 100 MG capsule Take 1 capsule (100 mg total) by mouth 2 (two) times daily. 10 capsule 0  . ferrous sulfate (FERROUSUL) 325 (65 FE) MG tablet Take 1 tablet (325 mg total) by mouth 3 (three) times daily with meals. 90 tablet 2  . nicotine (NICODERM CQ) 21 mg/24hr patch Place 1 patch (21 mg total) onto the skin daily. 28 patch 1  . oxyCODONE (OXY IR/ROXICODONE) 5 MG immediate release tablet Take 1-3 tablets (5-15 mg total) by mouth every 4 (four) hours as needed for moderate pain or severe pain. 80 tablet 0  . polyethylene glycol  (MIRALAX / GLYCOLAX) packet Take 17 g by mouth 2 (two) times daily. 14 each 0  . acetaminophen (TYLENOL) 500 MG tablet Take 2 tablets (1,000 mg total) by mouth every 6 (six) hours as needed for moderate pain. 30 tablet 0   No facility-administered medications prior to visit.     ROS Review of Systems  Constitutional: Negative.   Respiratory: Negative.   Cardiovascular: Negative.   Gastrointestinal: Negative.   Genitourinary: Negative.   Skin: Negative.   Neurological: Positive for numbness (fingers to left hand).       Paresthesias to bilateral wrist  Psychiatric/Behavioral: Negative.    Objective:  BP 112/72 (BP Location: Left Arm, Patient Position: Sitting, Cuff Size: Normal)   Pulse 74   Temp 97.6 F (36.4 C) (Oral)   Resp 18   Ht 5\' 7"  (1.702 m)   Wt 225 lb (102.1 kg)   SpO2 97%   BMI 35.24 kg/m   BP/Weight 08/26/2016 2/83/6629 05/10/6544  Systolic BP 503 546 568  Diastolic BP 72 76 71  Wt. (Lbs) 225 214.6 -  BMI 35.24 33.61 -    Physical Exam  Constitutional: She appears well-developed and well-nourished.  Cardiovascular: Normal rate, regular rhythm, normal heart sounds and intact distal pulses.   Pulmonary/Chest: Effort normal and breath sounds normal. Right breast exhibits no mass, no nipple discharge, no skin change and no tenderness. Left breast exhibits no mass, no nipple discharge, no skin change and no tenderness.  Abdominal: Soft. Bowel sounds are normal. There is  no tenderness.  Genitourinary: Vagina normal. Cervix exhibits no discharge. No vaginal discharge found.  Musculoskeletal: Normal range of motion.  Skin: Skin is warm and dry.  Psychiatric: She has a normal mood and affect.  Nursing note and vitals reviewed.   Assessment & Plan:   Problem List Items Addressed This Visit    None    Visit Diagnoses    Well woman exam with routine gynecological exam    -  Primary   Relevant Orders   Cytology - PAP [place] maintenance        Relevant Orders   MM SCREENING BREAST TOMO BILATERAL   Carpal tunnel syndrome, bilateral       Relevant Orders   Ambulatory referral to Orthopedics   History of right hip replacement       Relevant Medications   acetaminophen (TYLENOL) 500 MG tablet     Meds ordered this encounter  Medications  . acetaminophen (TYLENOL) 500 MG tablet    Sig: Take 2 tablets (1,000 mg total) by mouth every 6 (six) hours as needed for moderate pain.    Dispense:  30 tablet    Refill:  0    Order Specific Question:   Supervising Provider    Answer:   Tresa Garter [id number]    Follow-up: Return in about 3 months (around 11/26/2016) for HTN.   Alfonse Spruce FNP

## 2016-08-26 NOTE — Patient Instructions (Signed)
Breast Self-Awareness Breast self-awareness means:  Knowing how your breasts look.  Knowing how your breasts feel.  Checking your breasts every month for changes.  Telling your doctor if you notice a change in your breasts.  Breast self-awareness allows you to notice a breast problem early while it is still small. How to do a breast self-exam One way to learn what is normal for your breasts and to check for changes is to do a breast self-exam. To do a breast self-exam: Look for Changes  1. Take off all the clothes above your waist. 2. Stand in front of a mirror in a room with good lighting. 3. Put your hands on your hips. 4. Push your hands down. 5. Look at your breasts and nipples in the mirror to see if one breast or nipple looks different than the other. Check to see if: ? The shape of one breast is different. ? The size of one breast is different. ? There are wrinkles, dips, and bumps in one breast and not the other. 6. Look at each breast for changes in your skin, such as: ? Redness. ? Scaly areas. 7. Look for changes in your nipples, such as: ? Liquid around the nipples. ? Bleeding. ? Dimpling. ? Redness. ? A change in where the nipples are. Feel for Changes 1. Lie on your back on the floor. 2. Feel each breast. To do this, follow these steps: ? Pick a breast to feel. ? Put the arm closest to that breast above your head. ? Use your other arm to feel the nipple area of your breast. Feel the area with the pads of your three middle fingers by making small circles with your fingers. For the first circle, press lightly. For the second circle, press harder. For the third circle, press even harder. ? Keep making circles with your fingers at the light, harder, and even harder pressures as you move down your breast. Stop when you feel your ribs. ? Move your fingers a little toward the center of your body. ? Start making circles with your fingers again, this time going up until  you reach your collarbone. ? Keep making up and down circles until you reach your armpit. Remember to keep using the three pressures. ? Feel the other breast in the same way. 3. Sit or stand in the shower or tub. 4. With soapy water on your skin, feel each breast the same way you did in step 2, when you were lying on the floor. Write Down What You Find  After doing the self-exam, write down:  What is normal for each breast.  Any changes you find in each breast.  When you last had your period.  How often should I check my breasts? Check your breasts every month. If you are breastfeeding, the best time to check them is after you feed your baby or after you use a breast pump. If you get periods, the best time to check your breasts is 5-7 days after your period is over. When should I see my doctor? See your doctor if you notice:  A change in shape or size of your breasts or nipples.  A change in the skin of your breast or nipples, such as red or scaly skin.  Unusual fluid coming from your nipples.  A lump or thick area that was not there before.  Pain in your breasts.  Anything that concerns you.  This information is not intended to replace advice given to   you by your health care provider. Make sure you discuss any questions you have with your health care provider. Document Released: 07/09/2007 Document Revised: 06/28/2015 Document Reviewed: 12/10/2014 Elsevier Interactive Patient Education  2018 [employer] Pap Test Why am I having this test? A pap test is sometimes called a pap smear. It is a screening test that is used to check for signs of cancer of the vagina, cervix, and uterus. The test can also identify the presence of infection or precancerous changes. Your health care provider will likely recommend you have this test done on a regular basis. This test may be done:  Every 3 years, starting at age 21.  Every 5 years, in combination with testing for the presence of  human papillomavirus (HPV).  More or less often depending on other medical conditions.  What kind of sample is taken? Using a small cotton swab, plastic spatula, or brush, your health care provider will collect a sample of cells from the surface of your cervix. Your cervix is the opening to your uterus, also called a womb. Secretions from the cervix and vagina may also be collected. How do I prepare for this test?  Be aware of where you are in your menstrual cycle. You may be asked to reschedule the test if you are menstruating on the day of the test.  You may need to reschedule if you have a known vaginal infection on the day of the test.  You may be asked to avoid douching or taking a bath the day before or the day of the test.  Some medicines can cause abnormal test results, such as digitalis and tetracycline. Talk with your health care provider before your test if you take one of these medicines. What do the results mean? Abnormal test results may indicate a number of health conditions. These may include:  Cancer. Although pap test results cannot be used to diagnose cancer of the cervix, vagina, or uterus, they may suggest the possibility of cancer. Further tests would be required to determine if cancer is present.  Sexually transmitted disease.  Fungal infection.  Parasite infection.  Herpes infection.  A condition causing or contributing to infertility.  It is your responsibility to obtain your test results. Ask the lab or department performing the test when and how you will get your results. Contact your health care provider to discuss any questions you have about your results. Talk with your health care provider to discuss your results, treatment options, and if necessary, the need for more tests. Talk with your health care provider if you have any questions about your results. This information is not intended to replace advice given to you by your health care provider. Make  sure you discuss any questions you have with your health care provider. Document Released: 04/12/2002 Document Revised: 09/26/2015 Document Reviewed: 06/13/2013 Elsevier Interactive Patient Education  2018 Elsevier Inc.  

## 2016-08-27 LAB — HCV COMMENT:

## 2016-08-27 LAB — LIPID PANEL
Chol/HDL Ratio: 3.5 ratio (ref 0.0–4.4)
Cholesterol, Total: 215 mg/dL — ABNORMAL HIGH (ref 100–199)
HDL: 61 mg/dL (ref >39)
LDL CALC: 140 mg/dL — AB (ref 0–99)
TRIGLYCERIDES: 69 mg/dL (ref 0–149)
VLDL CHOLESTEROL CAL: 14 mg/dL (ref 5–40)

## 2016-08-27 LAB — CBC WITH DIFFERENTIAL/PLATELET
BASOS ABS: 0 10*3/uL (ref 0.0–0.2)
Basos: 1 %
EOS (ABSOLUTE): 0.3 10*3/uL (ref 0.0–0.4)
EOS: 3 %
HEMOGLOBIN: 11.1 g/dL (ref 11.1–15.9)
Hematocrit: 35.3 % (ref 34.0–46.6)
IMMATURE GRANS (ABS): 0 10*3/uL (ref 0.0–0.1)
IMMATURE GRANULOCYTES: 0 %
LYMPHS ABS: 3.2 10*3/uL — AB (ref 0.7–3.1)
Lymphs: 39 %
MCH: 28.1 pg (ref 26.6–33.0)
MCHC: 31.4 g/dL — ABNORMAL LOW (ref 31.5–35.7)
MCV: 89 fL (ref 79–97)
MONOCYTES: 5 %
MONOS ABS: 0.4 10*3/uL (ref 0.1–0.9)
NEUTROS PCT: 52 %
Neutrophils Absolute: 4.3 10*3/uL (ref 1.4–7.0)
Platelets: 458 10*3/uL — ABNORMAL HIGH (ref 150–379)
RBC: 3.95 x10E6/uL (ref 3.77–5.28)
RDW: 15 % (ref 12.3–15.4)
WBC: 8.2 10*3/uL (ref 3.4–10.8)

## 2016-08-27 LAB — CERVICOVAGINAL ANCILLARY ONLY
Bacterial vaginitis: POSITIVE — AB
Candida vaginitis: NEGATIVE

## 2016-08-27 LAB — HIV ANTIBODY (ROUTINE TESTING W REFLEX): HIV Screen 4th Generation wRfx: NONREACTIVE

## 2016-08-27 LAB — HEPATITIS C ANTIBODY (REFLEX): HCV Ab: <0.1 s/co ratio (ref 0.0–0.9)

## 2016-08-28 LAB — CYTOLOGY - PAP
Diagnosis: NEGATIVE
HPV (WINDOPATH): NOT DETECTED

## 2016-09-01 ENCOUNTER — Other Ambulatory Visit: Payer: Self-pay | Admitting: Family Medicine

## 2016-09-01 ENCOUNTER — Telehealth: Payer: Self-pay

## 2016-09-01 DIAGNOSIS — B9689 Other specified bacterial agents as the cause of diseases classified elsewhere: Secondary | ICD-10-CM

## 2016-09-01 DIAGNOSIS — N760 Acute vaginitis: Principal | ICD-10-CM

## 2016-09-01 DIAGNOSIS — E782 Mixed hyperlipidemia: Secondary | ICD-10-CM

## 2016-09-01 MED ORDER — PRAVASTATIN SODIUM 20 MG PO TABS: 20.0000 mg | ORAL_TABLET | Freq: Every day | ORAL | 2 refills | Status: DC

## 2016-09-01 MED ORDER — METRONIDAZOLE 500 MG PO TABS: 500.0000 mg | ORAL_TABLET | Freq: Two times a day (BID) | ORAL | 0 refills | Status: DC

## 2016-09-01 MED FILL — PRAVASTATIN NA 20 MG TAB: 20 | 30 days supply | Qty: 30 | Fill #0

## 2016-09-01 MED FILL — metroNIDAZOLE 500 MG TABS: 500 | 7 days supply | Qty: 14 | Fill #0

## 2016-09-01 NOTE — Telephone Encounter (Signed)
CMA call regarding ab results  Patient verify DOB   Patient was aware and understood

## 2016-09-01 NOTE — Telephone Encounter (Signed)
-----   Message from Alfonse Spruce, [place] sent at 09/01/2016  1:37 PM EDT ----- Pap smear showed no lesions or malignancy. Bacterial vaginosis was positive. BV is caused by an overgrowth of germs in the vagina. You will be prescribed metronidazole to treat. To reduce your risk of developing BV don't douche, don't use scented soap or sprays, and use protection during sexual intercourse. Yeast was  negative.

## 2016-09-01 NOTE — Telephone Encounter (Signed)
-----   Message from Alfonse Spruce, [place] sent at 09/01/2016  2:00 PM EDT ----- HIV is negative.  -Hepatitis C is negative.  -Lipid levels were elevated. This can increase your risk of heart disease. You will be prescribed pravastatin to help lower risk. History of anemia shows improvement. Continue to take iron supplements.  -Increase your dietary iron intake. Good sources of iron include dark green leafy vegetables, meats, beans, and iron fortified cereals. Recommend follow up in 3 months

## 2016-09-02 ENCOUNTER — Telehealth: Payer: Self-pay | Admitting: Family Medicine

## 2016-09-02 ENCOUNTER — Other Ambulatory Visit: Payer: Self-pay | Admitting: Family Medicine

## 2016-09-02 DIAGNOSIS — M62838 Other muscle spasm: Secondary | ICD-10-CM

## 2016-09-02 MED ORDER — CYCLOBENZAPRINE HCL 10 MG PO TABS: 10.0000 mg | ORAL_TABLET | Freq: Three times a day (TID) | ORAL | 0 refills | Status: DC | PRN

## 2016-09-02 MED FILL — ?CYCLOBENZAPRINE 10 MG TABL: 10 | 13 days supply | Qty: 40 | Fill #0

## 2016-09-02 NOTE — Telephone Encounter (Signed)
Pt called to request a refill for  cyclobenzaprine (FLEXERIL) 10 MG tablet  Since she having real back problem, please if you need to see her please let her know

## 2016-09-02 NOTE — Telephone Encounter (Signed)
Will refill for cyclobenzaprine 10 mg tablets. If she is experiencing bowel or bladder incontinence with back pain or loss of the ability to walk. She need to go the ED right away. If she not not experiencing these symptoms but back pain still persist she can schedule a follow up appointment as needed.

## 2016-09-02 NOTE — Telephone Encounter (Signed)
CMA call regarding to let her know of medication being sent to the pharmacy   Patient was aware and understood

## 2016-09-03 ENCOUNTER — Encounter: Payer: Self-pay | Admitting: Gastroenterology

## 2016-09-03 ENCOUNTER — Ambulatory Visit (INDEPENDENT_AMBULATORY_CARE_PROVIDER_SITE_OTHER): Payer: Medicaid Other | Admitting: Gastroenterology

## 2016-09-03 VITALS — BP 130/86 | HR 84 | Ht 67.0 in | Wt 221.5 lb

## 2016-09-03 DIAGNOSIS — Z1211 Encounter for screening for malignant neoplasm of colon: Secondary | ICD-10-CM

## 2016-09-03 DIAGNOSIS — Z1212 Encounter for screening for malignant neoplasm of rectum: Secondary | ICD-10-CM

## 2016-09-03 MED ORDER — NA SULFATE-K SULFATE-MG SULF 17.5-3.13-1.6 GM/177ML PO SOLN: 1.0000 | Freq: Once | ORAL | 0 refills | Status: AC

## 2016-09-03 NOTE — Patient Instructions (Signed)
You have been scheduled for a colonoscopy. Please follow written instructions given to you at your visit today.  Please pick up your prep supplies at the pharmacy within the next 1-3 days. If you use inhalers (even only as needed), please bring them with you on the day of your procedure. Your physician has requested that you go to www.startemmi.com and enter the access code given to you at your visit today. This web site gives a general overview about your procedure. However, you should still follow specific instructions given to you by our office regarding your preparation for the procedure.  Normal BMI (Body Mass Index- based on height and weight) is between 19 and 25. Your BMI today is Body mass index is 34.69 kg/m. Marland Kitchen Please consider follow up  regarding your BMI with your Primary Care Provider.  Thank you for choosing me and [facility].  Pricilla Riffle. Dagoberto Ligas., MD., [place]

## 2016-09-03 NOTE — Progress Notes (Signed)
History of Present Illness: This is a 56 year old female referred by Kimberly Sosa, F* for CRC screening. She is S/P right total hip arthroplasty in 07/2016 and S/P left total hip arthroplasty in 04/2016. She has made good recoveries from both surgeries. No prior colonoscopy. No GI complaints. Denies weight loss, abdominal pain, constipation, diarrhea, change in stool caliber, melena, hematochezia, nausea, vomiting, dysphagia, reflux symptoms, chest pain.   Allergies  Allergen Reactions  . Hydrocodone Other (See Comments)    Causes Headaches  . Mobic [Meloxicam] Other (See Comments)    Causes Headaches  . Tetracyclines & Related Swelling    All over body swelling   Outpatient Medications Prior to Visit  Medication Sig Dispense Refill  . cyclobenzaprine (FLEXERIL) 10 MG tablet Take 1 tablet (10 mg total) by mouth 3 (three) times daily as needed for muscle spasms. 40 tablet 0  . ferrous sulfate (FERROUSUL) 325 (65 FE) MG tablet Take 1 tablet (325 mg total) by mouth 3 (three) times daily with meals. 90 tablet 2  . lisinopril-hydrochlorothiazide (PRINZIDE,ZESTORETIC) 10-12.5 MG tablet Take 1 tablet by mouth daily. 90 tablet 0  . nicotine (NICODERM CQ) 21 mg/24hr patch Place 1 patch (21 mg total) onto the skin daily. (Patient taking differently: Place 21 mg onto the skin daily. ) 28 patch 1  . pravastatin (PRAVACHOL) 20 MG tablet Take 1 tablet (20 mg total) by mouth daily. (Patient taking differently: Take 20 mg by mouth daily. ) 30 tablet 2  . acetaminophen (TYLENOL) 500 MG tablet Take 2 tablets (1,000 mg total) by mouth every 6 (six) hours as needed for moderate pain. 30 tablet 0  . aspirin 81 MG chewable tablet Chew 1 tablet (81 mg total) by mouth 2 (two) times daily. Take for 4 weeks. 60 tablet 0  . bisacodyl (DULCOLAX) 5 MG EC tablet Take 10 mg by mouth daily as needed for moderate constipation.    . docusate sodium (COLACE) 100 MG capsule Take 1 capsule (100 mg total) by mouth 2  (two) times daily. 10 capsule 0  . metroNIDAZOLE (FLAGYL) 500 MG tablet Take 1 tablet (500 mg total) by mouth 2 (two) times daily. 14 tablet 0  . oxyCODONE (OXY IR/ROXICODONE) 5 MG immediate release tablet Take 1-3 tablets (5-15 mg total) by mouth every 4 (four) hours as needed for moderate pain or severe pain. 80 tablet 0  . polyethylene glycol (MIRALAX / GLYCOLAX) packet Take 17 g by mouth 2 (two) times daily. 14 each 0   No facility-administered medications prior to visit.    Past Medical History:  Diagnosis Date  . Active smoker   . Back pain   . Carpal tunnel syndrome    LEFT HAND  . DJD (degenerative joint disease), lumbar   . Hypercholesterolemia   . Hypertension   . Kidney stone   . Pinched nerve in neck    Past Surgical History:  Procedure Laterality Date  . APPENDECTOMY    . ROTATOR CUFF REPAIR    . 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 TOTAL HIP ARTHROPLASTY Right 07/08/2016   Procedure: RIGHT TOTAL HIP ARTHROPLASTY ANTERIOR APPROACH;  Surgeon: Paralee Cancel, MD;  Location: [facility];  Service: Orthopedics;  Laterality: Right;   Social History   Social History  . Marital status: Single    Spouse  name: N/A  . Number of children: 2  . Years of education: N/A   Occupational History  . disabled    Social History Main Topics  . Smoking status: Current Every Day Smoker    Types: Cigarettes    Last attempt to quit: 07/05/2016  . Smokeless tobacco: Never Used     Comment: 1 pack every 3 days  . Alcohol use No  . Drug use: Yes    Types: Marijuana     Comment: occasionally  . Sexual activity: Yes    Birth control/ protection: Surgical   Other Topics Concern  . None   Social History Narrative  . None   Family History  Problem Relation Age of Onset  . Diabetes Mother   . Hypertension Mother   .  Heart failure Mother   . Prostate cancer Father   . Diabetes Father   . Diabetes Sister   . Diabetes Brother        3 brothers deceased - CHF  . Healthy Sister   . Colon cancer Neg Hx   . Rectal cancer Neg Hx   . Esophageal cancer Neg Hx   . Liver cancer Neg Hx       Review of Systems: Pertinent positive and negative review of systems were noted in the above HPI section. All other review of systems were otherwise negative.   Physical Exam: General: Well developed, well nourished, no acute distress Head: Normocephalic and atraumatic Eyes:  sclerae anicteric, EOMI Ears: Normal auditory acuity Mouth: No deformity or lesions Neck: Supple, no masses or thyromegaly Lungs: Clear throughout to auscultation Heart: Regular rate and rhythm; no murmurs, rubs or bruits Abdomen: Soft, non tender and non distended. No masses, hepatosplenomegaly or hernias noted. Normal Bowel sounds Rectal: deferred to colonoscopy Musculoskeletal: Symmetrical with no gross deformities  Skin: No lesions on visible extremities Pulses:  Normal pulses noted Extremities: No clubbing, cyanosis, edema or deformities noted Neurological: Alert oriented x 4, grossly nonfocal Cervical Nodes:  No significant cervical adenopathy Inguinal Nodes: No significant inguinal adenopathy Psychological:  Alert and cooperative. Normal mood and affect  Assessment and Recommendations:  1. CRC screening, average risk. Schedule colonoscopy. The risks (including bleeding, perforation, infection, missed lesions, medication reactions and possible hospitalization or surgery if complications occur), benefits, and alternatives to colonoscopy with possible biopsy and possible polypectomy were discussed with the patient and they consent to proceed.    cc: Kimberly Sosa, [place],  [id number]

## 2016-09-08 ENCOUNTER — Telehealth: Payer: Self-pay | Admitting: Family Medicine

## 2016-09-08 NOTE — Telephone Encounter (Signed)
Discontinue taking metronidazole pills if causing nausea. Formulation can be switched to intravaginal cream x 5 days or clindamycin pills twice a day for 7 days per patient preference. If she experiences any signs of allergic reaction hives, itching, swelling of the face, tongue, or throat she needs to contact the office right away or go the ED.

## 2016-09-08 NOTE — Telephone Encounter (Signed)
Pt called requesting change of medication, pt states antibiotic prescribed has made her sick . Please advice?

## 2016-09-08 NOTE — Telephone Encounter (Signed)
Pt called requesting change of medication, pt states antibiotic prescribed has made her sick . Please f/up

## 2016-09-08 NOTE — Telephone Encounter (Signed)
CMA call regarding advice  Patient did not answer but left a VM stating the reason of the call & to call me back

## 2016-09-09 ENCOUNTER — Other Ambulatory Visit: Payer: Self-pay | Admitting: Family Medicine

## 2016-09-09 NOTE — Telephone Encounter (Signed)
Patient return cma call regarding medication change   Patient was aware and understood

## 2016-09-10 ENCOUNTER — Other Ambulatory Visit: Payer: Self-pay | Admitting: Family Medicine

## 2016-09-10 DIAGNOSIS — N760 Acute vaginitis: Principal | ICD-10-CM

## 2016-09-10 DIAGNOSIS — B9689 Other specified bacterial agents as the cause of diseases classified elsewhere: Secondary | ICD-10-CM

## 2016-09-10 MED ORDER — CLINDAMYCIN HCL 300 MG PO CAPS
300.0000 mg | ORAL_CAPSULE | Freq: Two times a day (BID) | ORAL | 0 refills | Status: DC
Start: 2016-09-10 — End: 2016-09-29

## 2016-09-10 NOTE — Telephone Encounter (Signed)
Medication sent for [facility] pharmacy.

## 2016-09-17 MED FILL — LISINOPRIL-HCTZ 10-12.5 MG: 10-12.5 | 30 days supply | Qty: 30 | Fill #2

## 2016-09-26 ENCOUNTER — Telehealth: Payer: Self-pay | Admitting: Family Medicine

## 2016-09-26 NOTE — Telephone Encounter (Signed)
Pt need to speak with the pcp or the nurse since the med she is taking is given her real bad site-effect please call her back to see what you can recommend to do

## 2016-09-29 ENCOUNTER — Other Ambulatory Visit: Payer: Self-pay | Admitting: Family Medicine

## 2016-09-29 DIAGNOSIS — N760 Acute vaginitis: Principal | ICD-10-CM

## 2016-09-29 DIAGNOSIS — B9689 Other specified bacterial agents as the cause of diseases classified elsewhere: Secondary | ICD-10-CM

## 2016-09-29 MED ORDER — METRONIDAZOLE 0.75 % VA GEL: 1.0000 | Freq: Every day | VAGINAL | 0 refills | Status: DC

## 2016-09-29 NOTE — Telephone Encounter (Signed)
CMA call regarding advice only   Patient was aware and understood

## 2016-09-29 NOTE — Progress Notes (Signed)
Patient called regarding antibiotics prescribed previously to treat + BV. She reports not picking up prescribed medication due to medication cost. She is also requesting topical option instead of oral medication.

## 2016-09-29 NOTE — Telephone Encounter (Signed)
Patient is requesting a cream medication instead of the pills

## 2016-09-29 NOTE — Telephone Encounter (Signed)
Metronidazole gel  sent to [facility] pharmacy

## 2016-10-22 ENCOUNTER — Telehealth: Payer: Self-pay

## 2016-10-22 ENCOUNTER — Other Ambulatory Visit: Payer: Self-pay | Admitting: Family Medicine

## 2016-10-22 DIAGNOSIS — I10 Essential (primary) hypertension: Secondary | ICD-10-CM

## 2016-10-22 MED ORDER — LISINOPRIL-HYDROCHLOROTHIAZIDE 10-12.5 MG PO TABS: 1.0000 | ORAL_TABLET | Freq: Every day | ORAL | 1 refills | Status: DC

## 2016-10-22 MED FILL — LISINOPRIL-HCTZ 10-12.5 MG: 10-12.5 | 90 days supply | Qty: 90 | Fill #0

## 2016-10-22 NOTE — Telephone Encounter (Signed)
Pt contacted the office and is requesting a refill on her bp medication. Pt is requesting her rx sent our pharmaqcy. Please f/u.   Pt is due for an appointment next month around 11-26-16 but patient is having surgery on 11-10-16 so pt requested to be seen earlier

## 2016-10-22 NOTE — Telephone Encounter (Signed)
CMA call regarding to let know the patient regarding her refills been sent to [facility] pharmacy  & that pcp will see her earlier than October  Patient did not answer but left a detailed message & if have any questions just to call back

## 2016-10-22 NOTE — Telephone Encounter (Signed)
Refill sent to [facility] pharmacy. Agreeable to seeing patient earlier, schedule patient as appropriate.

## 2016-10-22 NOTE — Telephone Encounter (Signed)
Pt called back and was informed of medication being sent to pharmacy , pt understood.

## 2016-10-28 ENCOUNTER — Ambulatory Visit: Payer: Medicaid Other | Attending: Family Medicine | Admitting: Family Medicine

## 2016-10-28 ENCOUNTER — Encounter: Payer: Self-pay | Admitting: Family Medicine

## 2016-10-28 VITALS — BP 127/80 | HR 85 | Temp 98.4°F | Resp 18 | Ht 67.0 in | Wt 234.8 lb

## 2016-10-28 DIAGNOSIS — G8929 Other chronic pain: Secondary | ICD-10-CM

## 2016-10-28 DIAGNOSIS — Z6830 Body mass index (BMI) 30.0-30.9, adult: Secondary | ICD-10-CM | POA: Insufficient documentation

## 2016-10-28 DIAGNOSIS — E782 Mixed hyperlipidemia: Secondary | ICD-10-CM | POA: Diagnosis not present

## 2016-10-28 DIAGNOSIS — Z760 Encounter for issue of repeat prescription: Secondary | ICD-10-CM

## 2016-10-28 DIAGNOSIS — B9689 Other specified bacterial agents as the cause of diseases classified elsewhere: Secondary | ICD-10-CM

## 2016-10-28 DIAGNOSIS — I10 Essential (primary) hypertension: Secondary | ICD-10-CM | POA: Diagnosis not present

## 2016-10-28 DIAGNOSIS — E669 Obesity, unspecified: Secondary | ICD-10-CM | POA: Insufficient documentation

## 2016-10-28 DIAGNOSIS — N760 Acute vaginitis: Secondary | ICD-10-CM

## 2016-10-28 DIAGNOSIS — Z1231 Encounter for screening mammogram for malignant neoplasm of breast: Secondary | ICD-10-CM | POA: Diagnosis not present

## 2016-10-28 DIAGNOSIS — M25551 Pain in right hip: Secondary | ICD-10-CM

## 2016-10-28 DIAGNOSIS — Z1239 Encounter for other screening for malignant neoplasm of breast: Secondary | ICD-10-CM

## 2016-10-28 MED ORDER — METRONIDAZOLE 0.75 % VA GEL: 1.0000 | Freq: Every day | VAGINAL | 0 refills | Status: DC

## 2016-10-28 MED ORDER — TRAMADOL HCL 50 MG PO TABS: 50.0000 mg | ORAL_TABLET | Freq: Three times a day (TID) | ORAL | 0 refills | Status: DC | PRN

## 2016-10-28 MED ORDER — LISINOPRIL-HYDROCHLOROTHIAZIDE 10-12.5 MG PO TABS: 1.0000 | ORAL_TABLET | Freq: Every day | ORAL | 1 refills | Status: DC

## 2016-10-28 MED ORDER — ACETAMINOPHEN 500 MG PO TABS
1000.0000 mg | ORAL_TABLET | Freq: Four times a day (QID) | ORAL | 0 refills | Status: DC | PRN
Start: 2016-10-28 — End: 2020-05-11

## 2016-10-28 MED ORDER — PRAVASTATIN SODIUM 20 MG PO TABS: 20.0000 mg | ORAL_TABLET | Freq: Every day | ORAL | 2 refills | Status: DC

## 2016-10-28 MED FILL — VANDAZOLE VAGINAL 0.75% GEL: 0.75 | 5 days supply | Qty: 70 | Fill #0

## 2016-10-28 MED FILL — traMADol HCL 50 MG TABS: 50 | 14 days supply | Qty: 40 | Fill #0

## 2016-10-28 NOTE — Progress Notes (Signed)
Patient is here for f/up  

## 2016-10-28 NOTE — Patient Instructions (Addendum)
[facility] [place], [place], [place] [address]  Phone: [phone] Muscle Pain, Adult Muscle pain (myalgia) may be mild or severe. In most cases, the pain lasts only a short time and it goes away without treatment. It is normal to feel some muscle pain after starting a workout program. Muscles that have not been used often will be sore at first. Muscle pain may also be caused by many other things, including:  Overuse or muscle strain, especially if you are not in shape. This is the most common cause of muscle pain.  Injury.  Bruises.  Viruses, such as the flu.  Infectious diseases.  A chronic condition that causes muscle tenderness, fatigue, and headache (fibromyalgia).  A condition, such as lupus, in which the body's disease-fighting system attacks other organs in the body (autoimmune or rheumatologic diseases).  Certain drugs, including ACE inhibitors and statins.  To diagnose the cause of your muscle pain, your health care provider will do a physical exam and ask questions about the pain and when it began. If you have not had muscle pain for very long, your health care provider may want to wait before doing much testing. If your muscle pain has lasted a long time, your health care provider may want to run tests right away. In some cases, this may include tests to rule out certain conditions or illnesses. Treatment for muscle pain depends on the cause. Home care is often enough to relieve muscle pain. Your health care provider may also prescribe anti-inflammatory medicine. Follow these instructions at home: Activity  If overuse is causing your muscle pain: ? Slow down your activities until the pain goes away. ? Do regular, gentle exercises if you are not usually active. ? Warm up before exercising. Stretch before and after exercising. This can help lower the risk of muscle pain.  Do not continue working out if the pain is very bad. Bad pain could  mean that you have injured a muscle. Managing pain and discomfort   If directed, apply ice to the sore muscle: ? Put ice in a plastic bag. ? Place a towel between your skin and the bag. ? Leave the ice on for 20 minutes, 2-3 times a day.  You may also alternate between applying ice and applying heat as told by your health care provider. To apply heat, use the heat source that your health care provider recommends, such as a moist heat pack or a heating pad. ? Place a towel between your skin and the heat source. ? Leave the heat on for 20-30 minutes. ? Remove the heat if your skin turns bright red. This is especially important if you are unable to feel pain, heat, or cold. You may have a greater risk of getting burned. Medicines  Take over-the-counter and prescription medicines only as told by your health care provider.  Do not drive or use heavy machinery while taking prescription pain medicine. Contact a health care provider if:  Your muscle pain gets worse and medicines do not help.  You have muscle pain that lasts longer than 3 days.  You have a rash or fever along with muscle pain.  You have muscle pain after a tick bite.  You have muscle pain while working out, even though you are in good physical condition.  You have redness, soreness, or swelling along with muscle pain.  You have muscle pain after starting a new medicine or changing the dose of a medicine. Get help  right away if:  You have trouble breathing.  You have trouble swallowing.  You have muscle pain along with a stiff neck, fever, and vomiting.  You have severe muscle weakness or cannot move part of your body. This information is not intended to replace advice given to you by your health care provider. Make sure you discuss any questions you have with your health care provider. Document Released: 12/12/2005 Document Revised: 08/10/2015 Document Reviewed: 06/12/2015 Elsevier Interactive Patient Education   2018 [employer].

## 2016-10-28 NOTE — Progress Notes (Deleted)
  Days  Cant lay on side  excuriatign back  Standing 2 to 3   10/8 pickeed nerve   Orthopedics   Alvan Dame 8/18   Needs cream for BV   colonscopy   Nov 13 th [facility]  MM screen GI needs #

## 2016-10-30 ENCOUNTER — Other Ambulatory Visit: Payer: Self-pay | Admitting: Family Medicine

## 2016-10-30 DIAGNOSIS — Z1231 Encounter for screening mammogram for malignant neoplasm of breast: Secondary | ICD-10-CM

## 2016-10-30 NOTE — Progress Notes (Signed)
Subjective:  Patient ID: Kimberly Sosa, female    DOB: [birth year]  Age: 56 y.o. MRN: [id number]  CC: Hip Pain    HPI ERNISHA SORN presents for hypetension follow up. She is exercising and is not adherent to low salt diet. She does not check BP at home. Cardiac symptoms none. Patient denies chest pain, chest pressure/discomfort, claudication, dyspnea, fatigue, near-syncope, palpitations and syncope.  Cardiovascular risk factors: dyslipidemia, hypertension, obesity (BMI >= 30 kg/m2) and smoking/ tobacco exposure.  Use of agents associated with hypertension: none. History of target organ damage: none. History of right hip replacement on 07/08/2016. She complains of arthralgias  located in the right hip(s), is described as aching, shooting and throbbing, and is intermittent .  Associated symptoms include: decreased range of motion. Pain is aggravated by lying down and standing. The patient has taken tylenol with minimal relief of symptoms. She ambulates with assistance of cane.   Outpatient Medications Prior to Visit  Medication Sig Dispense Refill  . cyclobenzaprine (FLEXERIL) 10 MG tablet Take 1 tablet (10 mg total) by mouth 3 (three) times daily as needed for muscle spasms. 40 tablet 0  . ferrous sulfate (FERROUSUL) 325 (65 FE) MG tablet Take 1 tablet (325 mg total) by mouth 3 (three) times daily with meals. 90 tablet 2  . nicotine (NICODERM CQ) 21 mg/24hr patch Place 1 patch (21 mg total) onto the skin daily. (Patient taking differently: Place 21 mg onto the skin daily. ) 28 patch 1  . lisinopril-hydrochlorothiazide (PRINZIDE,ZESTORETIC) 10-12.5 MG tablet Take 1 tablet by mouth daily. 90 tablet 1  . metroNIDAZOLE (METROGEL) 0.75 % vaginal gel Place 1 Applicatorful vaginally at bedtime. For 5 days. 70 g 0  . pravastatin (PRAVACHOL) 20 MG tablet Take 1 tablet (20 mg total) by mouth daily. (Patient taking differently: Take 20 mg by mouth daily. ) 30 tablet 2   No facility-administered  medications prior to visit.     ROS Review of Systems  Constitutional: Negative.   Eyes: Negative.   Respiratory: Negative.   Cardiovascular: Negative.   Gastrointestinal: Negative.   Musculoskeletal: Positive for arthralgias and myalgias.  Skin: Negative.     Objective:  BP 127/80 (BP Location: Left Arm, Patient Position: Sitting, Cuff Size: Normal)   Pulse 85   Temp 98.4 F (36.9 C) (Oral)   Resp 18   Ht 5\' 7"  (1.702 m)   Wt 234 lb 12.8 oz (106.5 kg)   SpO2 100%   BMI 36.77 kg/m   BP/Weight 10/28/2016 09/03/2016 0/34/7425  Systolic BP 956 387 564  Diastolic BP 80 86 72  Wt. (Lbs) 234.8 221.5 225  BMI 36.77 34.69 35.24   Physical Exam  Constitutional: She appears well-developed and well-nourished.  Eyes: Pupils are equal, round, and reactive to light. Conjunctivae are normal.  Neck: No JVD present.  Cardiovascular: Normal rate, regular rhythm, normal heart sounds and intact distal pulses.   Pulmonary/Chest: Effort normal and breath sounds normal.  Abdominal: Soft. Bowel sounds are normal.  Musculoskeletal:  Pain to right lateral hip.  Skin: Skin is warm and dry.     Nursing note and vitals reviewed.   Assessment & Plan:   1. Essential hypertension  - lisinopril-hydrochlorothiazide (PRINZIDE,ZESTORETIC) 10-12.5 MG tablet; Take 1 tablet by mouth daily.  Dispense: 90 tablet; Refill: 1  2. Chronic right hip pain  - traMADol (ULTRAM) 50 MG tablet; Take 1 tablet (50 mg total) by mouth every 8 (eight) hours as needed for severe pain.  Dispense:  40 tablet; Refill: 0 - acetaminophen (TYLENOL) 500 MG tablet; Take 2 tablets (1,000 mg total) by mouth every 6 (six) hours as needed for moderate pain.  Dispense: 30 tablet; Refill: 0  3. BV (bacterial vaginosis) Patient reports not picking up medication for positive BV. - metroNIDAZOLE (METROGEL) 0.75 % vaginal gel; Place 1 Applicatorful vaginally at bedtime. For 5 days.  Dispense: 70 g; Refill: 0  4. Mixed  hyperlipidemia  - pravastatin (PRAVACHOL) 20 MG tablet; Take 1 tablet (20 mg total) by mouth daily.  Dispense: 30 tablet; Refill: 2  5. Medication refill Patient reports not picking up medication for positive BV. - metroNIDAZOLE (METROGEL) 0.75 % vaginal gel; Place 1 Applicatorful vaginally at bedtime. For 5 days.  Dispense: 70 g; Refill: 0  6. Breast cancer screening  - MM SCREENING BREAST TOMO BILATERAL; Future   Meds ordered this encounter  Medications  . traMADol (ULTRAM) 50 MG tablet    Sig: Take 1 tablet (50 mg total) by mouth every 8 (eight) hours as needed for severe pain.    Dispense:  40 tablet    Refill:  0    Order Specific Question:   Supervising Provider    Answer:   Tresa Garter [id number]  . metroNIDAZOLE (METROGEL) 0.75 % vaginal gel    Sig: Place 1 Applicatorful vaginally at bedtime. For 5 days.    Dispense:  70 g    Refill:  0    Order Specific Question:   Supervising Provider    Answer:   Tresa Garter [id number]  . lisinopril-hydrochlorothiazide (PRINZIDE,ZESTORETIC) 10-12.5 MG tablet    Sig: Take 1 tablet by mouth daily.    Dispense:  90 tablet    Refill:  1    Order Specific Question:   Supervising Provider    Answer:   Tresa Garter [id number]  . pravastatin (PRAVACHOL) 20 MG tablet    Sig: Take 1 tablet (20 mg total) by mouth daily.    Dispense:  30 tablet    Refill:  2    Order Specific Question:   Supervising Provider    Answer:   Tresa Garter [id number]  . acetaminophen (TYLENOL) 500 MG tablet    Sig: Take 2 tablets (1,000 mg total) by mouth every 6 (six) hours as needed for moderate pain.    Dispense:  30 tablet    Refill:  0    Order Specific Question:   Supervising Provider    Answer:   Tresa Garter [id number]    Follow-up: Return in about 3 months (around 01/27/2017) for HTN .   Alfonse Spruce FNP

## 2016-10-31 ENCOUNTER — Other Ambulatory Visit: Payer: Self-pay | Admitting: Family Medicine

## 2016-10-31 ENCOUNTER — Telehealth: Payer: Self-pay | Admitting: Family Medicine

## 2016-10-31 DIAGNOSIS — N760 Acute vaginitis: Principal | ICD-10-CM

## 2016-10-31 DIAGNOSIS — L299 Pruritus, unspecified: Secondary | ICD-10-CM

## 2016-10-31 DIAGNOSIS — B9689 Other specified bacterial agents as the cause of diseases classified elsewhere: Secondary | ICD-10-CM

## 2016-10-31 MED ORDER — DIPHENHYDRAMINE HCL 25 MG PO CAPS: 25.0000 mg | ORAL_CAPSULE | Freq: Four times a day (QID) | ORAL | 0 refills | Status: DC | PRN

## 2016-10-31 MED ORDER — CLINDAMYCIN HCL 300 MG PO CAPS: 300.0000 mg | ORAL_CAPSULE | Freq: Two times a day (BID) | ORAL | 0 refills | Status: DC

## 2016-10-31 NOTE — Telephone Encounter (Signed)
CMA call regarding patient is experiencing allergy reaction to the medication   Pcp order new medications patient was aware and understood

## 2016-10-31 NOTE — Telephone Encounter (Signed)
Pt. Called stating that she was prescribed metroNIDAZOLE (METROGEL) 0.75 % vaginal gel and thinks she has an allergic reaction. Pt. States she is itchy. Please f/u with pt.

## 2016-10-31 NOTE — Telephone Encounter (Signed)
Pt. Called stating that she was prescribed metroNIDAZOLE (METROGEL) 0.75 % vaginal gel and thinks she has an allergic reaction. Pt. States she is itchy. Please advice?

## 2016-10-31 NOTE — Telephone Encounter (Signed)
Discontinue metronidazole use. She may take over the counter (OTC) benadryl for itching/suspected reacation. Prescribed clindamycin (CLEOCIN) 300 MG capsule and benadryl 25 MG sent to [facility] pharmacy.  Follow up in office if symptoms do not improve.

## 2016-11-03 MED FILL — CLINDAMYCIN HCL 300 MG CAP: 300 | 7 days supply | Qty: 14 | Fill #0

## 2016-11-07 ENCOUNTER — Ambulatory Visit: Payer: Medicaid Other

## 2016-11-11 ENCOUNTER — Encounter: Payer: Medicaid Other | Admitting: Gastroenterology

## 2016-11-26 ENCOUNTER — Ambulatory Visit: Payer: Medicaid Other | Admitting: Family Medicine

## 2016-12-01 ENCOUNTER — Ambulatory Visit: Payer: Medicaid Other

## 2016-12-04 ENCOUNTER — Ambulatory Visit: Payer: Medicaid Other | Attending: Orthopedic Surgery | Admitting: Occupational Therapy

## 2016-12-04 DIAGNOSIS — M25622 Stiffness of left elbow, not elsewhere classified: Secondary | ICD-10-CM | POA: Diagnosis present

## 2016-12-04 DIAGNOSIS — M25532 Pain in left wrist: Secondary | ICD-10-CM | POA: Diagnosis present

## 2016-12-04 DIAGNOSIS — M25522 Pain in left elbow: Secondary | ICD-10-CM | POA: Diagnosis present

## 2016-12-04 DIAGNOSIS — M6281 Muscle weakness (generalized): Secondary | ICD-10-CM

## 2016-12-04 NOTE — Therapy (Signed)
[facility] [address] [place], [place], [address] Phone: [phone]   Fax:  [phone]  Occupational Therapy Evaluation  Patient Details  Name: Kimberly Sosa MRN: [id number] Date of Birth: 07/30/1960 Referring Provider: Dr. Caralyn Guile  Encounter Date: 12/04/2016      OT End of Session - 12/04/16 1216    Visit Number 1   Number of Visits 12   Date for OT Re-Evaluation 02/02/17   Authorization Type MCD - awaiting approval   OT Start Time 1015   OT Stop Time 1145   OT Time Calculation (min) 90 min   Activity Tolerance Patient tolerated treatment well      Past Medical History:  Diagnosis Date  . Active smoker   . Back pain   . Carpal tunnel syndrome    LEFT HAND  . DJD (degenerative joint disease), lumbar   . Hypercholesterolemia   . Hypertension   . Kidney stone   . Pinched nerve in neck     Past Surgical History:  Procedure Laterality Date  . APPENDECTOMY    . ROTATOR CUFF REPAIR    . 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 TOTAL HIP ARTHROPLASTY Right 07/08/2016   Procedure: RIGHT TOTAL HIP ARTHROPLASTY ANTERIOR APPROACH;  Surgeon: Paralee Cancel, MD;  Location: [facility];  Service: Orthopedics;  Laterality: Right;    There were no vitals filed for this visit.      Subjective Assessment - 12/04/16 1021    Pertinent History s/p LT CTR and cubital tunnel transposition with eaton sling 11/10/16.  PMH: Lt THR 04/08/16, Rt THR 07/08/16   Patient Stated Goals Get my hand and elbow better   Currently in Pain? Yes   Pain Score 8   6/10 LT WRIST, 8/10 LT ELBOW   Pain Location Elbow   Pain Orientation Left   Pain Descriptors / Indicators Numbness;Pins and needles;Sharp   Pain Type Acute pain;Surgical pain   Pain Onset 1 to 4 weeks ago   Pain  Frequency Intermittent   Aggravating Factors  nothing   Pain Relieving Factors pain meds           [facility] OT Assessment - 12/04/16 0001      Assessment   Diagnosis s/p Lt CTR, Lt cubital tunnel release (Eaton sling procedure)   Referring Provider Dr. Caralyn Guile   Onset Date 11/10/16   Assessment Pt arrived unprotected at elbow/forearm/wrist. Pt reports she has not had anything on LUE since MD appointment on 11/24/16. Pt only had gauze wrapping around incision at carpal tunnel.    Prior Therapy none     Precautions   Precautions Other (comment)   Precaution Comments no strengthening or P/ROM Lt elbow, forearm, wrist. OK for A/ROM at this time as pt is 3 1/2 weeks post-op.    Required Braces or Orthoses Other Brace/Splint   Other Brace/Splint MD ordered long arm splint splint protocol for Eaton sling cubital tunnel release. Pt however has been without splint since 11/24/16 ( 1 and 1/2 weeks).      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 Pronation WFL's, slightly limited supination. Full composite finger flex/ext                  OT Treatments/Exercises (OP) - 12/04/16 0001      ADLs   ADL Comments Performed hygiene care and instructed in how to properly clean wrist incision, as well as entire LUE. Reviewed splint wear and care, precautions, and hygiene care.      Exercises   Exercises --  see pt instructions for A/ROM HEP to elbow, forearm, wrist,      Splinting   Splinting Fabricated and fitted  long arm splint per protocol and issued. Pt will only need to wear splint for 1.5 to 2 more weeks (per protocol)                OT Education - 12/04/16 1146    Education provided Yes   Education Details hygiene care, splint wear and care, A/ROM HEP, precautions   Person(s) Educated Patient   Methods Explanation;Demonstration;Handout   Comprehension Verbalized understanding;Returned demonstration          OT Short Term Goals - 12/04/16 1632      OT SHORT TERM GOAL #1   Title Independent with splint wear and care   Baseline issued, may need adjustmentst   Time 4   Period Weeks   Status New   Target Date 01/03/17     OT SHORT TERM GOAL #2   Title Independent with initial HEP    Baseline ISSUED, will need review/updates   Time 4   Period Weeks   Status New     OT SHORT TERM GOAL #3   Title Pt to report pain less than or equal to 5/10 Lt elbow    Baseline 8/10   Time 4   Period Weeks   Status New           OT Long Term Goals - 12/04/16 1638      OT LONG TERM GOAL #1   Title Independent with updated strengthening HEP    Baseline Dependent d/t current precautions   Time 8   Period Weeks   Status New   Target Date 02/02/17     OT LONG TERM GOAL #2   Title Pt to have full forearm rotation LUE   Baseline supination limited approx. 20%   Time 8   Period Weeks   Status New     OT LONG TERM GOAL #3   Title Pt to have grip strength Lt hand to 30 lbs or greater   Baseline unable to assess d/t current precautions   Time 8   Period Weeks   Status New     OT LONG TERM GOAL #4   Title Pt to return to using LUE as assist for all bilateral tasks   Baseline dependent d/t current precautions   Time 8   Period Weeks   Status New               Plan - 12/04/16 1217    Clinical Impression Statement Pt is a 56 y.o. female who presents  to outpatient rehab s/p Lt CTR and Lt cubital tunnel release and transposition with eaton sling on 11/10/16. Pt has been  unprotected since last MD appointment on 11/24/16 and comes today for fabrication of Long arm splint (per MD orders and protocol) and initiation of A/ROM HEP per protocol.    Occupational Profile and client history currently impacting functional performance Lt THR 04/08/16, Rt THR 07/08/16   Occupational performance deficits (Please refer to evaluation for details): ADL's;IADL's;Work;Leisure   Rehab Potential Good   OT Frequency --  3 visits over next 4 weeks, then 2x/wk for 4 weeks   OT Treatment/Interventions Self-care/ADL training;Moist Heat;DME and/or AE instruction;Fluidtherapy;Splinting;Patient/family education;Contrast Bath;Compression bandaging;Therapeutic exercises;Scar mobilization;Ultrasound;Therapeutic activities;Cryotherapy;Passive range of motion;Parrafin;Electrical Stimulation;Manual Therapy   Plan Pt to return in 2 weeks to d/c splint, begin putty ex's Lt hand and AA/ROM LT elbow/forearm/wrist. Pt to call and come back sooner if splint adjustments are needed.    Clinical Decision Making Limited treatment options, no task modification necessary   Consulted and Agree with Plan of Care Patient      Patient will benefit from skilled therapeutic intervention in order to improve the following deficits and impairments:  Decreased range of motion, Impaired sensation, Decreased knowledge of precautions, Impaired UE functional use, Pain, Decreased strength  Visit Diagnosis: Pain in left elbow - Plan: Ot plan of care cert/re-cert  Pain in left wrist - Plan: Ot plan of care cert/re-cert  Stiffness of left elbow, not elsewhere classified - Plan: Ot plan of care cert/re-cert  Muscle weakness (generalized) - Plan: Ot plan of care cert/re-cert    Problem List Patient Active Problem List   Diagnosis Date Noted  . Obese 07/09/2016  . 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 Hypertension 03/20/2016    Carey Bullocks, OTR/L 12/04/2016, 4:44 PM  [facility] [address] [place], [place], [address] Phone: [phone]   Fax:  [phone]  Name: HUMA IMHOFF MRN: [id number] Date of Birth: [birth year]

## 2016-12-04 NOTE — Patient Instructions (Signed)
  WEARING SCHEDULE:  Wear splint at ALL times except for hygiene care. May remove splint for exercises (see below) and then place back on as directed by the therapist  PURPOSE:  To prevent movement and for protection until injury can heal  CARE OF SPLINT:  Keep splint away from heat sources including: stove, radiator or furnace, or a car in sunlight. The splint can melt and will no longer fit you properly  Keep away from pets and children  Clean the splint with rubbing alcohol 1-2 times per day.  * During this time, make sure you also clean your hand/arm as instructed by your therapist and/or perform dressing changes as needed. Then dry hand/arm completely before replacing splint. (When cleaning hand/arm, keep it immobilized in same position until splint is replaced)  PRECAUTIONS/POTENTIAL PROBLEMS: *If you notice or experience increased pain, swelling, numbness, or a lingering reddened area from the splint: Contact your therapist immediately by calling [phone]. You must wear the splint for protection, but we will get you scheduled for adjustments as quickly as possible.  (If only straps or hooks need to be replaced and NO adjustments to the splint need to be made, just call the office ahead and let them know you are coming in)  If you have any medical concerns or signs of infection, please call your doctor immediately   AROM: Elbow Flexion / Extension    Gently bend elbow as far as possible. Then straighten arm as far as possible. Repeat _15___ times per set. Do _6___ sessions per day.    Elbow / Wrist Supination / Pronation   Bend elbow(s) to 90 and hold close to body. Turn palm(s) up. Then turn palm(s) down. Keep wrist straight. Repeat sequence _15___ times per session. Do _6___ sessions per day.  AROM: Wrist Extension   .  With __Lt__ palm down, bend wrist up and down. Repeat __15__ times per set.  Do __6__ sessions per day.    Flexor Tendon Gliding (Active Hook  Fist)   With fingers and knuckles straight, bend middle and tip joints. Do not bend large knuckles. Repeat _15___ times. Do _6___ sessions per day.    Finger Flexion / Extension   Make fist, then open hand fully Repeat sequence _15___ times per session. Do _6__ sessions per day.

## 2016-12-08 ENCOUNTER — Encounter: Payer: Medicaid Other | Admitting: Occupational Therapy

## 2016-12-15 ENCOUNTER — Telehealth: Payer: Self-pay | Admitting: Gastroenterology

## 2016-12-15 NOTE — Telephone Encounter (Signed)
No charge for late cancellation this time.  

## 2016-12-15 NOTE — Telephone Encounter (Signed)
Patient states she had surgery on her arm and lost instructions for colon set for tomorrow 11.13.18. Patient states she did not follow diet and wants to resch. Patient rescheduled for this Friday 11.16.18, and instructions were reviewed.

## 2016-12-16 ENCOUNTER — Encounter: Payer: Self-pay | Admitting: Gastroenterology

## 2016-12-18 ENCOUNTER — Ambulatory Visit: Payer: Medicaid Other | Admitting: Occupational Therapy

## 2016-12-19 ENCOUNTER — Other Ambulatory Visit: Payer: Self-pay

## 2016-12-19 ENCOUNTER — Encounter: Payer: Self-pay | Admitting: Gastroenterology

## 2016-12-19 ENCOUNTER — Ambulatory Visit (AMBULATORY_SURGERY_CENTER): Payer: Medicaid Other | Admitting: Gastroenterology

## 2016-12-19 VITALS — BP 117/84 | HR 85 | Temp 100.0°F | Resp 15 | Ht 67.0 in | Wt 221.0 lb

## 2016-12-19 DIAGNOSIS — Z1212 Encounter for screening for malignant neoplasm of rectum: Secondary | ICD-10-CM | POA: Diagnosis not present

## 2016-12-19 DIAGNOSIS — D122 Benign neoplasm of ascending colon: Secondary | ICD-10-CM | POA: Diagnosis not present

## 2016-12-19 DIAGNOSIS — D125 Benign neoplasm of sigmoid colon: Secondary | ICD-10-CM

## 2016-12-19 DIAGNOSIS — D124 Benign neoplasm of descending colon: Secondary | ICD-10-CM | POA: Diagnosis not present

## 2016-12-19 DIAGNOSIS — Z1211 Encounter for screening for malignant neoplasm of colon: Secondary | ICD-10-CM

## 2016-12-19 MED ORDER — SODIUM CHLORIDE 0.9 % IV SOLN: 500.0000 mL | INTRAVENOUS | Status: DC

## 2016-12-19 NOTE — Progress Notes (Signed)
To recovery, report to RN, VSS. 

## 2016-12-19 NOTE — Patient Instructions (Signed)
YOU HAD AN ENDOSCOPIC PROCEDURE TODAY AT THE [facility]:   Refer to the procedure report that was given to you for any specific questions about what was found during the examination.  If the procedure report does not answer your questions, please call your gastroenterologist to clarify.  If you requested that your care partner not be given the details of your procedure findings, then the procedure report has been included in a sealed envelope for you to review at your convenience later.  YOU SHOULD EXPECT: Some feelings of bloating in the abdomen. Passage of more gas than usual.  Walking can help get rid of the air that was put into your GI tract during the procedure and reduce the bloating. If you had a lower endoscopy (such as a colonoscopy or flexible sigmoidoscopy) you may notice spotting of blood in your stool or on the toilet paper. If you underwent a bowel prep for your procedure, you may not have a normal bowel movement for a few days.  Please Note:  You might notice some irritation and congestion in your nose or some drainage.  This is from the oxygen used during your procedure.  There is no need for concern and it should clear up in a day or so.  SYMPTOMS TO REPORT IMMEDIATELY:   Following lower endoscopy (colonoscopy or flexible sigmoidoscopy):  Excessive amounts of blood in the stool  Significant tenderness or worsening of abdominal pains  Swelling of the abdomen that is new, acute  Fever of 100F or higher   For urgent or emergent issues, a gastroenterologist can be reached at any hour by calling [phone].   DIET:  We do recommend a small meal at first, but then you may proceed to your regular diet.  Drink plenty of fluids but you should avoid alcoholic beverages for 24 hours.  ACTIVITY:  You should plan to take it easy for the rest of today and you should NOT DRIVE or use heavy machinery until tomorrow (because of the sedation medicines used during the test).     FOLLOW UP: Our staff will call the number listed on your records the next business day following your procedure to check on you and address any questions or concerns that you may have regarding the information given to you following your procedure. If we do not reach you, we will leave a message.  However, if you are feeling well and you are not experiencing any problems, there is no need to return our call.  We will assume that you have returned to your regular daily activities without incident.  If any biopsies were taken you will be contacted by phone or by letter within the next 1-3 weeks.  Please call us at [phone] if you have not heard about the biopsies in 3 weeks.    SIGNATURES/CONFIDENTIALITY: You and/or your care partner have signed paperwork which will be entered into your electronic medical record.  These signatures attest to the fact that that the information above on your After Visit Summary has been reviewed and is understood.  Full responsibility of the confidentiality of this discharge information lies with you and/or your care-partner.  Read all of the handouts given to you by your recovery room nurse. 

## 2016-12-19 NOTE — Progress Notes (Signed)
Called to room to assist during endoscopic procedure.  Patient ID and intended procedure confirmed with present staff. Received instructions for my participation in the procedure from the performing physician.  

## 2016-12-19 NOTE — Op Note (Signed)
[facility] Patient Name: Kimberly Sosa Procedure Date: 12/19/2016 9:45 AM MRN: [id number] Endoscopist: Ladene Artist , MD Age: 56 Referring MD:  Date of Birth: 09-May-1960 Gender: Female Account #: [id number] Procedure:                Colonoscopy Indications:              Screening for colorectal malignant neoplasm Medicines:                Monitored Anesthesia Care Procedure:                Pre-Anesthesia Assessment:                           - Prior to the procedure, a History and Physical                            was performed, and patient medications and                            allergies were reviewed. The patient's tolerance of                            previous anesthesia was also reviewed. The risks                            and benefits of the procedure and the sedation                            options and risks were discussed with the patient.                            All questions were answered, and informed consent                            was obtained. Prior Anticoagulants: The patient has                            taken no previous anticoagulant or antiplatelet                            agents. ASA Grade Assessment: II - A patient with                            mild systemic disease. After reviewing the risks                            and benefits, the patient was deemed in                            satisfactory condition to undergo the procedure.                           After obtaining informed consent, the colonoscope  was passed under direct vision. Throughout the                            procedure, the patient's blood pressure, pulse, and                            oxygen saturations were monitored continuously. The                            Colonoscope was introduced through the anus and                            advanced to the the cecum, identified by                            appendiceal orifice and  ileocecal valve. The                            ileocecal valve, appendiceal orifice, and rectum                            were photographed. The quality of the bowel                            preparation was adequate. The colonoscopy was                            performed without difficulty. The patient tolerated                            the procedure well. Scope In: 9:51:39 AM Scope Out: 10:10:08 AM Scope Withdrawal Time: 0 hours 14 minutes 20 seconds  Total Procedure Duration: 0 hours 18 minutes 29 seconds  Findings:                 The perianal and digital rectal examinations were                            normal.                           A 5 mm polyp was found in the ascending colon. The                            polyp was sessile. The polyp was removed with a                            cold biopsy forceps. Resection and retrieval were                            complete.                           Five sessile polyps were found in the sigmoid colon                            (  2) and descending colon (3). The polyps were 6 to                            8 mm in size. These polyps were removed with a cold                            snare. Resection and retrieval were complete.                           Anal papilla(e) were hypertrophied.                           Internal hemorrhoids were found during                            retroflexion. The hemorrhoids were small and Grade                            I (internal hemorrhoids that do not prolapse).                           The exam was otherwise without abnormality on                            direct and retroflexion views. Complications:            No immediate complications. Estimated blood loss:                            None. Estimated Blood Loss:     Estimated blood loss: none. Impression:               - One 5 mm polyp in the ascending colon, removed                            with a cold biopsy forceps. Resected  and retrieved.                           - Five 6 to 8 mm polyps in the sigmoid colon and in                            the descending colon, removed with a cold snare.                            Resected and retrieved.                           - Anal papilla(e) were hypertrophied.                           - Internal hemorrhoids.                           - The examination was otherwise normal on direct  and retroflexion views. Recommendation:           - Repeat colonoscopy in 3 - 5 years for                            surveillance if polyp(s) precancerous, otherwise 10                            years.                           - Patient has a contact number available for                            emergencies. The signs and symptoms of potential                            delayed complications were discussed with the                            patient. Return to normal activities tomorrow.                            Written discharge instructions were provided to the                            patient.                           - Resume previous diet.                           - Continue present medications.                           - Await pathology results. Ladene Artist, MD 12/19/2016 10:16:09 AM This report has been signed electronically.

## 2016-12-22 ENCOUNTER — Telehealth: Payer: Self-pay | Admitting: *Deleted

## 2016-12-22 ENCOUNTER — Ambulatory Visit: Payer: Medicaid Other | Admitting: Occupational Therapy

## 2016-12-22 DIAGNOSIS — M6281 Muscle weakness (generalized): Secondary | ICD-10-CM

## 2016-12-22 DIAGNOSIS — M25532 Pain in left wrist: Secondary | ICD-10-CM

## 2016-12-22 DIAGNOSIS — M25522 Pain in left elbow: Secondary | ICD-10-CM | POA: Diagnosis not present

## 2016-12-22 NOTE — Patient Instructions (Signed)
1. Grip Strengthening (Resistive Putty)   Squeeze putty using thumb and all fingers. Repeat _15-20___ times. Do __2__ sessions per day. May advance to green putty in a week   2. MP Flexion (Resistive Putty)    Bending only at large knuckles, press putty down against thumb. Keep fingertips straight. "Duck bill" Repeat _10___ times. Do __2__ sessions per day.    3. Roll putty into tube on table and pinch between each finger and thumb x 10 reps each. (Do ring and small finger together). Do 2 sessions per day

## 2016-12-22 NOTE — Therapy (Signed)
[facility] [address] [place], [place], [address] Phone: [phone]   Fax:  [phone]  Occupational Therapy Treatment  Patient Details  Name: Kimberly Sosa MRN: [id number] Date of Birth: [birth year] Referring Provider: Dr. Caralyn Guile   Encounter Date: 12/22/2016  OT End of Session - 12/22/16 1014    Visit Number  2    Number of Visits  12    Date for OT Re-Evaluation  02/02/17    Authorization Type  MCD     Authorization Time Period  approved 3 OT visits from 12/18/16 - 01/16/17    Authorization - Visit Number  1    Authorization - Number of Visits  3    OT Start Time  0930    OT Stop Time  1018    OT Time Calculation (min)  48 min    Activity Tolerance  Patient tolerated treatment well    Behavior During Therapy  [facility] for tasks assessed/performed       Past Medical History:  Diagnosis Date  . Active smoker   . Back pain   . Carpal tunnel syndrome    LEFT HAND  . DJD (degenerative joint disease), lumbar   . Hypercholesterolemia   . Hypertension   . Kidney stone   . Pinched nerve in neck     Past Surgical History:  Procedure Laterality Date  . APPENDECTOMY    . LEFT TOTAL HIP ARTHROPLASTY ANTERIOR APPROACH Left 04/08/2016   Performed by Paralee Cancel, MD at [facility]  . RIGHT TOTAL HIP ARTHROPLASTY ANTERIOR APPROACH Right 07/08/2016   Performed by Paralee Cancel, MD at [facility]  . ROTATOR CUFF REPAIR      There were no vitals filed for this visit.  Subjective Assessment - 12/22/16 0935    Subjective   I feel good    Pertinent History  s/p LT CTR and cubital tunnel transposition with eaton sling 11/10/16.  PMH: Lt THR 04/08/16, Rt THR 07/08/16    Patient Stated Goals  Get my hand and elbow better    Currently in Pain?  Yes    Pain Score  1     Pain Location  Elbow    Pain Descriptors / Indicators  -- sensitivity around incisions    Pain Onset  More than a month ago    Pain Frequency  Intermittent     Aggravating Factors   nothing    Pain Relieving Factors  heat         [facility] OT Assessment - 12/22/16 0001      Hand Function   Right Hand Grip (lbs)  97 lbs    Left Hand Grip (lbs)  55 lbs               OT Treatments/Exercises (OP) - 12/22/16 0001      ADLs   ADL Comments  Pt now 6 weeks post-op and protocol calls for P/ROM to elbow and forearm for cubital tunnel Eaton sling procedure, however pt has full ROM at elbow, forearm, and wrist,  therefore does not need P/ROM at this time. Pt sees MD today and advised pt to discuss with him possible stitch remaining at carpal tunnel incision. Pt instructed in scar massage but advised to wait at carpal tunnel incision until MD has looked at incision. Ok to start scar massage at elbow       Exercises   Exercises  Wrist;Hand      Wrist Exercises  Forearm Supination  AROM;15 reps    Forearm Pronation  AROM;15 reps    Wrist Flexion  AROM;15 reps    Wrist Extension  AROM;15 reps      Hand Exercises   Other Hand Exercises  Tendon gliding ex's in intrinsic (-) position x 15 reps    Other Hand Exercises  Pt issued putty HEP and issued red and green resistance putty. Pt to use red putty this week, but advance to green putty for whole grip (ex #1) next week. See pt instructions for details.       Modalities   Modalities  Fluidotherapy      LUE Fluidotherapy   Number Minutes Fluidotherapy  12 Minutes    LUE Fluidotherapy Location  Hand;Wrist    Comments  to decr. sensitivity and stiffness at wrist             OT Education - 12/22/16 0959    Education provided  Yes    Education Details  Putty HEP    Person(s) Educated  Patient    Methods  Explanation;Demonstration;Handout    Comprehension  Verbalized understanding;Returned demonstration       OT Short Term Goals - 12/22/16 1017      OT SHORT TERM GOAL #1   Title  Independent with splint wear and care    Baseline  issued, may need adjustmentst    Time  4    Period   Weeks    Status  Achieved      OT SHORT TERM GOAL #2   Title  Independent with initial HEP     Baseline  ISSUED, will need review/updates    Time  4    Period  Weeks    Status  Achieved      OT SHORT TERM GOAL #3   Title  Pt to report pain less than or equal to 5/10 Lt elbow     Baseline  8/10    Time  4    Period  Weeks    Status  Achieved        OT Long Term Goals - 12/22/16 1017      OT LONG TERM GOAL #1   Title  Independent with updated strengthening HEP     Baseline  Dependent d/t current precautions    Time  8    Period  Weeks    Status  On-going      OT LONG TERM GOAL #2   Title  Pt to have full forearm rotation LUE    Baseline  supination limited approx. 20%    Time  8    Period  Weeks    Status  Achieved      OT LONG TERM GOAL #3   Title  Pt to have grip strength Lt hand to 30 lbs or greater    Baseline  unable to assess d/t current precautions    Time  8    Period  Weeks    Status  Achieved      OT LONG TERM GOAL #4   Title  Pt to return to using LUE as assist for all bilateral tasks    Baseline  dependent d/t current precautions    Time  8    Period  Weeks    Status  On-going            Plan - 12/22/16 1020    Clinical Impression Statement  Pt has met all STG's and some LTG's at  this time. Pt has A/ROM WFL's at this time.     Rehab Potential  Good    OT Frequency  -- 3 visits over next month    OT Treatment/Interventions  Self-care/ADL training;Moist Heat;DME and/or AE instruction;Fluidtherapy;Splinting;Patient/family education;Contrast Bath;Compression bandaging;Therapeutic exercises;Scar mobilization;Ultrasound;Therapeutic activities;Cryotherapy;Passive range of motion;Parrafin;Electrical Stimulation;Manual Therapy    Plan  Pt to return in 2 weeks (when she will be 8 weeks post-op) to begin strengthening Lt elbow with 2 to 3 lb. Weight. Possible d/c next session or make 1 more appt.    Consulted and Agree with Plan of Care  Patient        Patient will benefit from skilled therapeutic intervention in order to improve the following deficits and impairments:  Decreased range of motion, Impaired sensation, Decreased knowledge of precautions, Impaired UE functional use, Pain, Decreased strength  Visit Diagnosis: Muscle weakness (generalized)  Pain in left elbow  Pain in left wrist    Problem List Patient Active Problem List   Diagnosis Date Noted  . Obese 07/09/2016  . 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 Hypertension 03/20/2016    Carey Bullocks, OTR/L 12/22/2016, 10:23 AM  [facility] [address] [place], [place], [address] Phone: [phone]   Fax:  [phone]  Name: Kimberly Sosa MRN: [id number] Date of Birth: 11/09/1960

## 2016-12-22 NOTE — Telephone Encounter (Signed)
  Follow up Call-  Call back number 12/19/2016  Post procedure Call Back phone  # [phone]  Permission to leave phone message Yes  Some recent data might be hidden     Left message we will return call later this afternoon to check on her  Olga

## 2016-12-22 NOTE — Telephone Encounter (Signed)
  Follow up Call-  Call back number 12/19/2016  Post procedure Call Back phone  # [phone]  Permission to leave phone message Yes  Some recent data might be hidden     Patient questions:  Do you have a fever, pain , or abdominal swelling? No. Pain Score  0 *  Have you tolerated food without any problems? Yes.    Have you been able to return to your normal activities? Yes.    Do you have any questions about your discharge instructions: Diet   No. Medications  No. Follow up visit  No.  Do you have questions or concerns about your Care? No.  Actions: * If pain score is 4 or above: No action needed, pain <4.

## 2016-12-30 ENCOUNTER — Encounter: Payer: Medicaid Other | Admitting: Occupational Therapy

## 2016-12-31 ENCOUNTER — Encounter: Payer: Self-pay | Admitting: Gastroenterology

## 2017-01-06 ENCOUNTER — Ambulatory Visit: Payer: Medicaid Other | Attending: Orthopedic Surgery | Admitting: Occupational Therapy

## 2017-01-06 DIAGNOSIS — M25522 Pain in left elbow: Secondary | ICD-10-CM | POA: Insufficient documentation

## 2017-01-06 DIAGNOSIS — M6281 Muscle weakness (generalized): Secondary | ICD-10-CM | POA: Diagnosis present

## 2017-01-06 NOTE — Therapy (Signed)
[facility] [address] [place], [place], [address] Phone: [phone]   Fax:  [phone]  Occupational Therapy Treatment  Patient Details  Name: Kimberly Sosa MRN: [id number] Date of Birth: 1960-08-30 Referring Provider: Dr. Caralyn Guile   Encounter Date: 01/06/2017  OT End of Session - 01/06/17 1033    Visit Number  3    Number of Visits  12    Date for OT Re-Evaluation  02/02/17    Authorization Type  MCD     Authorization Time Period  approved 3 OT visits from 12/18/16 - 01/16/17    Authorization - Visit Number  2    Authorization - Number of Visits  3    OT Start Time  2876    OT Stop Time  1018    OT Time Calculation (min)  43 min    Activity Tolerance  Patient tolerated treatment well       Past Medical History:  Diagnosis Date  . Active smoker   . Back pain   . Carpal tunnel syndrome    LEFT HAND  . DJD (degenerative joint disease), lumbar   . Hypercholesterolemia   . Hypertension   . Kidney stone   . Pinched nerve in neck     Past Surgical History:  Procedure Laterality Date  . APPENDECTOMY    . ROTATOR CUFF REPAIR    . 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 TOTAL HIP ARTHROPLASTY Right 07/08/2016   Procedure: RIGHT TOTAL HIP ARTHROPLASTY ANTERIOR APPROACH;  Surgeon: Paralee Cancel, MD;  Location: [facility];  Service: Orthopedics;  Laterality: Right;    There were no vitals filed for this visit.  Subjective Assessment - 01/06/17 0940    Subjective   My elbow hurts just to lay it on the pillow     Pertinent History  s/p LT CTR and cubital tunnel transposition with eaton sling 11/10/16.  PMH: Lt THR 04/08/16, Rt THR 07/08/16    Patient Stated Goals  Get my hand and elbow better    Currently in Pain?  Yes    Pain Score  6     Pain  Location  Elbow    Pain Orientation  Left    Pain Descriptors / Indicators  Throbbing    Pain Type  Acute pain;Surgical pain    Pain Onset  More than a month ago    Pain Frequency  Intermittent    Aggravating Factors   Cold    Pain Relieving Factors  movement, heat, NSAIDS         [facility] OT Assessment - 01/06/17 0001      Hand Function   Left Hand Grip (lbs)  65 LBS               OT Treatments/Exercises (OP) - 01/06/17 0001      ADLs   ADL Comments  Pt issued desensitization techniques for Lt elbow d/t continued sensitivity over Lt elbow (even with pillow). Pt also still reports numbness along ulnar n. distribution.       Exercises   Exercises  Elbow      Elbow Exercises   Elbow Flexion  Strengthening;15 reps with 3 lb weight    Elbow Extension  Strengthening;15 reps with 2 lb. weight      Modalities   Modalities  Moist Heat      Moist Heat Therapy   Number Minutes Moist Heat  10 Minutes    Moist Heat Location  Elbow Lt (simultaneously with fluidotherapy)      LUE Fluidotherapy   Number Minutes Fluidotherapy  10 Minutes    LUE Fluidotherapy Location  Hand;Wrist    Comments  to decr. stiffness at wrist             OT Education - 01/06/17 1007    Education provided  Yes    Education Details  Strengthening HEP for elbow, desensitization techniques    Person(s) Educated  Patient    Methods  Explanation;Demonstration;Handout    Comprehension  Verbalized understanding;Returned demonstration       OT Short Term Goals - 12/22/16 1017      OT SHORT TERM GOAL #1   Title  Independent with splint wear and care    Baseline  issued, may need adjustmentst    Time  4    Period  Weeks    Status  Achieved      OT SHORT TERM GOAL #2   Title  Independent with initial HEP     Baseline  ISSUED, will need review/updates    Time  4    Period  Weeks    Status  Achieved      OT SHORT TERM GOAL #3   Title  Pt to report pain less than or equal to 5/10 Lt elbow      Baseline  8/10    Time  4    Period  Weeks    Status  Achieved        OT Long Term Goals - 01/06/17 1036      OT LONG TERM GOAL #1   Title  Independent with updated strengthening HEP     Baseline  Dependent d/t current precautions    Time  8    Period  Weeks    Status  Achieved      OT LONG TERM GOAL #2   Title  Pt to have full forearm rotation LUE    Baseline  supination limited approx. 20%    Time  8    Period  Weeks    Status  Achieved      OT LONG TERM GOAL #3   Title  Pt to have grip strength Lt hand to 30 lbs or greater    Baseline  unable to assess d/t current precautions    Time  8    Period  Weeks    Status  Achieved      OT LONG TERM GOAL #4   Title  Pt to return to using LUE as assist for all bilateral tasks    Baseline  dependent d/t current precautions    Time  8    Period  Weeks    Status  On-going            Plan - 01/06/17 1036    Clinical Impression Statement  Pt has met all but 1 LTG. Pt has full ROM and tolerating strengthening well to Lt elbow. Pt also has improved Lt grip strength. Pt's only limitation is continued numbness along ulnar n. distribution and sensitivity at Lt elbow    Rehab Potential  Good    OT Frequency  -- 3 visits over month    OT Treatment/Interventions  Self-care/ADL training;Moist Heat;Fluidtherapy;DME and/or AE instruction;Splinting;Contrast Bath;Therapeutic activities;Ultrasound;Therapeutic exercise;Cryotherapy;Passive range of motion;Electrical Stimulation;Paraffin;Manual Therapy;Patient/family education  Plan  continue fluidotherapy, strengthening Lt hand and elbow and d/c next session. Check remaining LTG    Consulted and Agree with Plan of Care  Patient       Patient will benefit from skilled therapeutic intervention in order to improve the following deficits and impairments:  Decreased range of motion, Impaired sensation, Decreased knowledge of precautions, Impaired UE functional use, Pain, Decreased  strength  Visit Diagnosis: Muscle weakness (generalized)  Pain in left elbow    Problem List Patient Active Problem List   Diagnosis Date Noted  . Obese 07/09/2016  . 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 Hypertension 03/20/2016    Carey Bullocks, OTR/L 01/06/2017, 10:40 AM  [facility] [address] [place], [place], [address] Phone: [phone]   Fax:  [phone]  Name: Kimberly Sosa MRN: [id number] Date of Birth: Oct 27, 1960

## 2017-01-06 NOTE — Patient Instructions (Signed)
Desensitization Techniques  Perform these exercises ever 2 hours for 15 minute sessions.  Progress to the next exercises when the exercises you are doing become easy.  1)  Using light pressure, rub the various textures along with the hypersensitive area:  A.  [place]  C.  Wool  G.  Cotton material  D.  Terry cloth  2)  With the same textures use a firmer pressure. 3)  Use a hand held vibrator and massage along the sensitive area. 4)  With a small dowel rod, eraser on a pencil or base of an ink pen tap along the sensitive area. 5)  Use an empty roll-on deodorant bottle to roll along the sensitive area. 6)  Place your hand/forearm in separate containers of the following items:  A.  Sand  D.  Dry lentil beans  B.  Dry Rice  E.  Dry kidney beans  C.  Ball bearings F.  Dry pinto beans  Scar Massage Purpose: To soften/smooth scar tissue.   To desensitize sensitive areas after surgery.   To mechanically break up inner scar tissue, adhesions, therefore allowing freer        movement of injured tendons and muscle.  Technique: Use a cream to massage with, as it insures a smooth gliding motion and avoids irritation caused by rubbing skin to skin.  Cream is preferred over a lotion.   May begin as soon as any suture areas are healed.   Apply a firm, steady pressure with your finger-tip, pulling the skin in a circular motion over the scarred area.  Do not rub.  Message 5 minutes, at least 2 times a day, unless you are getting tender afterwards   Flexion (Resistive)    Hold a can weighing _2 to 3_lbs  in hand and bend elbow, keeping wrist straight. Support elbow with folded towel on table, or hold close to body. Hold _3___ seconds. Repeat __15__ times. Do _3___ sessions per day.  Extension (Resistive)    Hold can weighing _2___ lbs. Point elbow up and out, and straighten arm without moving shoulder. Hold __3__ seconds. Lower slowly by bending  elbow. Can do this lying down. Repeat _15___ times. Do __3__ sessions per day.

## 2017-01-13 ENCOUNTER — Encounter: Payer: Medicaid Other | Admitting: Occupational Therapy

## 2017-01-21 NOTE — Therapy (Signed)
[facility] [address] [place], [place], [address] Phone: [phone]   Fax:  [phone]  Patient Details  Name: Kimberly Sosa MRN: [id number] Date of Birth: Nov 04, 1960 Referring Provider:  Dr. Caralyn Guile Encounter Date: 01/21/2017  OCCUPATIONAL THERAPY DISCHARGE SUMMARY  Visits from Start of Care: 3  Current functional level related to goals / functional outcomes: OT Short Term Goals - 12/22/16 1017      OT SHORT TERM GOAL #1   Title  Independent with splint wear and care    Baseline  issued, may need adjustmentst    Time  4    Period  Weeks    Status  Achieved      OT SHORT TERM GOAL #2   Title  Independent with initial HEP     Baseline  ISSUED, will need review/updates    Time  4    Period  Weeks    Status  Achieved      OT SHORT TERM GOAL #3   Title  Pt to report pain less than or equal to 5/10 Lt elbow     Baseline  8/10    Time  4    Period  Weeks    Status  Achieved      OT Long Term Goals - 01/06/17 1036      OT LONG TERM GOAL #1   Title  Independent with updated strengthening HEP     Baseline  Dependent d/t current precautions    Time  8    Period  Weeks    Status  Achieved      OT LONG TERM GOAL #2   Title  Pt to have full forearm rotation LUE    Baseline  supination limited approx. 20%    Time  8    Period  Weeks    Status  Achieved      OT LONG TERM GOAL #3   Title  Pt to have grip strength Lt hand to 30 lbs or greater    Baseline  unable to assess d/t current precautions    Time  8    Period  Weeks    Status  Achieved      OT LONG TERM GOAL #4   Title  Pt to return to using LUE as assist for all bilateral tasks    Baseline  dependent d/t current precautions    Time  8    Period  Weeks    Status  Unknown - unable to assess        Remaining deficits: Numbness along ulnar n. distribution and sensitivity at elbow. (Pt now has full ROM and increased grip and elbow  strength WNL's.)   Education / Equipment: HEP's, desensitization techniques  Plan: Patient agrees to discharge.  Patient goals were met. Patient is being discharged due to meeting the stated rehab goals.  Pt was going to be seen only 1 more visit, but MCD date ran out. Attempted to call patient today to make aware, but unable to leave message. Do not feel pt needs to return at this time, d/t no other deficits other than numbness?????         Carey Bullocks, OTR/L 01/21/2017, 3:00 PM  [facility] [address] [place], [place], [address] Phone: [phone]   Fax:  [phone]

## 2017-01-28 ENCOUNTER — Ambulatory Visit: Payer: Medicaid Other | Admitting: Family Medicine

## 2017-01-28 MED FILL — LISINOPRIL-HCTZ 10-12.5 MG: 10-12.5 | 90 days supply | Qty: 90 | Fill #1

## 2017-03-12 DIAGNOSIS — Z96641 Presence of right artificial hip joint: Secondary | ICD-10-CM | POA: Insufficient documentation

## 2017-03-12 DIAGNOSIS — Z96642 Presence of left artificial hip joint: Secondary | ICD-10-CM | POA: Insufficient documentation

## 2017-05-04 ENCOUNTER — Telehealth: Payer: Self-pay | Admitting: Family Medicine

## 2017-05-04 DIAGNOSIS — I10 Essential (primary) hypertension: Secondary | ICD-10-CM

## 2017-05-04 MED ORDER — LISINOPRIL-HYDROCHLOROTHIAZIDE 10-12.5 MG PO TABS: 1.0000 | ORAL_TABLET | Freq: Every day | ORAL | 0 refills | Status: DC

## 2017-05-04 NOTE — Telephone Encounter (Signed)
Refilled x 30 days - must have OV for further refills.

## 2017-05-04 NOTE — Telephone Encounter (Signed)
Pt called to request a refill for lisinopril-hydrochlorothiazide (PRINZIDE,ZESTORETIC) 10-12.5 MG tablet Please sent it to  [facility], [address] Please follow up

## 2017-05-05 MED FILL — LISINOPRIL-HCTZ 10-12.5 MG: 10-12.5 | 30 days supply | Qty: 30 | Fill #0

## 2017-06-03 ENCOUNTER — Ambulatory Visit: Payer: Self-pay | Attending: Internal Medicine | Admitting: Physician Assistant

## 2017-06-03 ENCOUNTER — Ambulatory Visit: Payer: Medicaid Other | Admitting: Family Medicine

## 2017-06-03 VITALS — BP 135/85 | HR 94 | Temp 98.3°F | Resp 16 | Wt 246.6 lb

## 2017-06-03 DIAGNOSIS — E7800 Pure hypercholesterolemia, unspecified: Secondary | ICD-10-CM | POA: Insufficient documentation

## 2017-06-03 DIAGNOSIS — F17200 Nicotine dependence, unspecified, uncomplicated: Secondary | ICD-10-CM | POA: Insufficient documentation

## 2017-06-03 DIAGNOSIS — M62838 Other muscle spasm: Secondary | ICD-10-CM | POA: Insufficient documentation

## 2017-06-03 DIAGNOSIS — G5600 Carpal tunnel syndrome, unspecified upper limb: Secondary | ICD-10-CM | POA: Insufficient documentation

## 2017-06-03 DIAGNOSIS — D509 Iron deficiency anemia, unspecified: Secondary | ICD-10-CM | POA: Insufficient documentation

## 2017-06-03 DIAGNOSIS — R739 Hyperglycemia, unspecified: Secondary | ICD-10-CM

## 2017-06-03 DIAGNOSIS — I10 Essential (primary) hypertension: Secondary | ICD-10-CM | POA: Insufficient documentation

## 2017-06-03 MED ORDER — METHOCARBAMOL 500 MG PO TABS: 500.0000 mg | ORAL_TABLET | Freq: Four times a day (QID) | ORAL | 1 refills | Status: DC | PRN

## 2017-06-03 MED ORDER — LISINOPRIL-HYDROCHLOROTHIAZIDE 10-12.5 MG PO TABS: 1.0000 | ORAL_TABLET | Freq: Every day | ORAL | 3 refills | Status: DC

## 2017-06-03 MED FILL — LISINOPRIL-HCTZ 10-12.5 MG: 10-12.5 | 30 days supply | Qty: 30 | Fill #0

## 2017-06-03 NOTE — Progress Notes (Signed)
F/u HTN and med RF Muscle relaxer

## 2017-06-03 NOTE — Progress Notes (Signed)
Patient ID: Kimberly Sosa, female   DOB: 03-Aug-1960, 57 y.o.   MRN: [id number]   Kimberly Sosa, is a 57 y.o. female  ZRA:[id number]  [id number]:[id number]  DOB - 08/18/1960  Subjective:  Chief Complaint and HPI: Kimberly Sosa is a 57 y.o. female here today for BP check up and labs.  Doing well.  No problems.  Also needs Hgb rechecked.  Not on iron anymore since after hip surgery. Has used flexeril for muscle spasm and wants RF.  Uses this for mid to LBP that is ongoing.  Compliant with BP meds.   Lots of family members have diabetes  ROS:   Constitutional:  No f/c, No night sweats, No unexplained weight loss. EENT:  No vision changes, No blurry vision, No hearing changes. No mouth, throat, or ear problems.  Respiratory: No cough, No SOB Cardiac: No CP, no palpitations GI:  No abd pain, No N/V/D. GU: No Urinary s/sx Musculoskeletal: back pain on and off-none today Neuro: No headache, no dizziness, no motor weakness.  Skin: No rash Endocrine:  No polydipsia. No polyuria.  Psych: Denies SI/HI  No problems updated.  ALLERGIES: Allergies  Allergen Reactions  . Hydrocodone Other (See Comments)    Causes Headaches  . Metronidazole Itching  . Mobic [Meloxicam] Other (See Comments)    Causes Headaches  . Tetracyclines & Related Swelling    All over body swelling    PAST MEDICAL HISTORY: Past Medical History:  Diagnosis Date  . Active smoker   . Back pain   . Carpal tunnel syndrome    LEFT HAND  . DJD (degenerative joint disease), lumbar   . Hypercholesterolemia   . Hypertension   . Kidney stone   . Pinched nerve in neck     MEDICATIONS AT HOME: Prior to Admission medications   Medication Sig Start Date End Date Taking? Authorizing Provider  acetaminophen (TYLENOL) 500 MG tablet Take 2 tablets (1,000 mg total) by mouth every 6 (six) hours as needed for moderate pain. 10/28/16  Yes Hairston, Maylon Peppers, FNP  lisinopril-hydrochlorothiazide (PRINZIDE,ZESTORETIC) 10-12.5 MG  tablet Take 1 tablet by mouth daily. 06/03/17  Yes Argentina Donovan, PA-C  ferrous sulfate (FERROUSUL) 325 (65 FE) MG tablet Take 1 tablet (325 mg total) by mouth 3 (three) times daily with meals. Patient not taking: Reported on 12/04/2016 07/23/16   Alfonse Spruce, FNP  methocarbamol (ROBAXIN) 500 MG tablet Take 1 tablet (500 mg total) by mouth every 6 (six) hours as needed for muscle spasms. 06/03/17   Argentina Donovan, PA-C  traMADol (ULTRAM) 50 MG tablet Take 1 tablet (50 mg total) by mouth every 8 (eight) hours as needed for severe pain. Patient not taking: Reported on 06/03/2017 10/28/16   Alfonse Spruce, FNP     Objective:  EXAM:   Vitals:   06/03/17 1527  BP: 135/85  Pulse: 94  Resp: 16  Temp: 98.3 F (36.8 C)  TempSrc: Oral  SpO2: 97%  Weight: 246 lb 9.6 oz (111.9 kg)    General appearance : A&OX3. NAD. Non-toxic-appearing HEENT: Atraumatic and Normocephalic.  PERRLA. EOM intact.   Neck: supple, no JVD. No cervical lymphadenopathy. No thyromegaly Chest/Lungs:  Breathing-non-labored, Good air entry bilaterally, breath sounds normal without rales, rhonchi, or wheezing  CVS: S1 S2 regular, no murmurs, gallops, rubs  Extremities: Bilateral Lower Ext shows no edema, both legs are warm to touch with = pulse throughout Neurology:  CN II-XII grossly intact, Non focal.   Psych:  TP linear.  J/I WNL. Normal speech. Appropriate eye contact and affect.  Skin:  No Rash  Data Review No results found for: HGBA1C   Assessment & Plan   1. Essential hypertension At goal-continue - lisinopril-hydrochlorothiazide (PRINZIDE,ZESTORETIC) 10-12.5 MG tablet; Take 1 tablet by mouth daily.  Dispense: 90 tablet; Refill: 3 - Comprehensive metabolic panel  2. Muscle spasm - methocarbamol (ROBAXIN) 500 MG tablet; Take 1 tablet (500 mg total) by mouth every 6 (six) hours as needed for muscle spasms.  Dispense: 90 tablet; Refill: 1  3. Iron deficiency anemia, unspecified iron deficiency  anemia type Not on iron - CBC with Differential/Platelet  4. H/o hyperglycemia-will check HgbA1c     Patient have been counseled extensively about nutrition and exercise  No follow-ups on file.  The patient was given clear instructions to go to ER or return to medical center if symptoms don't improve, worsen or new problems develop. The patient verbalized understanding. The patient was told to call to get lab results if they haven't heard anything in the next week.     Freeman Caldron, PA-C [facility] and [facility], [place]   06/03/2017, 3:52 PM

## 2017-06-04 LAB — CBC WITH DIFFERENTIAL/PLATELET
Basophils Absolute: 0 10*3/uL (ref 0.0–0.2)
Basos: 0 %
EOS (ABSOLUTE): 0.1 10*3/uL (ref 0.0–0.4)
EOS: 1 %
HEMATOCRIT: 38.1 % (ref 34.0–46.6)
HEMOGLOBIN: 12.5 g/dL (ref 11.1–15.9)
IMMATURE GRANULOCYTES: 0 %
Immature Grans (Abs): 0 10*3/uL (ref 0.0–0.1)
Lymphocytes Absolute: 4.2 10*3/uL — ABNORMAL HIGH (ref 0.7–3.1)
Lymphs: 36 %
MCH: 29.4 pg (ref 26.6–33.0)
MCHC: 32.8 g/dL (ref 31.5–35.7)
MCV: 90 fL (ref 79–97)
MONOCYTES: 7 %
Monocytes Absolute: 0.8 10*3/uL (ref 0.1–0.9)
NEUTROS PCT: 56 %
Neutrophils Absolute: 6.4 10*3/uL (ref 1.4–7.0)
Platelets: 446 10*3/uL — ABNORMAL HIGH (ref 150–379)
RBC: 4.25 x10E6/uL (ref 3.77–5.28)
RDW: 14.4 % (ref 12.3–15.4)
WBC: 11.6 10*3/uL — AB (ref 3.4–10.8)

## 2017-06-04 LAB — COMPREHENSIVE METABOLIC PANEL
ALBUMIN: 4.5 g/dL (ref 3.5–5.5)
ALT: 10 IU/L (ref 0–32)
AST: 11 IU/L (ref 0–40)
Albumin/Globulin Ratio: 1.6 (ref 1.2–2.2)
Alkaline Phosphatase: 114 IU/L (ref 39–117)
BUN/Creatinine Ratio: 16 (ref 9–23)
BUN: 17 mg/dL (ref 6–24)
Bilirubin Total: 0.2 mg/dL (ref 0.0–1.2)
CO2: 22 mmol/L (ref 20–29)
Calcium: 9.9 mg/dL (ref 8.7–10.2)
Chloride: 104 mmol/L (ref 96–106)
Creatinine, Ser: 1.04 mg/dL — ABNORMAL HIGH (ref 0.57–1.00)
GFR calc Af Amer: 69 mL/min/{1.73_m2} (ref >59)
GFR calc non Af Amer: 60 mL/min/{1.73_m2} (ref >59)
GLUCOSE: 104 mg/dL — AB (ref 65–99)
Globulin, Total: 2.8 g/dL (ref 1.5–4.5)
Potassium: 3.9 mmol/L (ref 3.5–5.2)
Sodium: 140 mmol/L (ref 134–144)
Total Protein: 7.3 g/dL (ref 6.0–8.5)

## 2017-06-04 LAB — HEMOGLOBIN A1C
Est. average glucose Bld gHb Est-mCnc: 123 mg/dL
HEMOGLOBIN A1C: 5.9 % — AB (ref 4.8–5.6)

## 2017-06-05 ENCOUNTER — Telehealth: Payer: Self-pay

## 2017-06-05 NOTE — Telephone Encounter (Signed)
Patient return nurse call regarding VM   Patient was inform about her results   Patient was aware and understood

## 2017-06-30 MED FILL — LISINOPRIL-HCTZ 10-12.5 MG: 10-12.5 | 30 days supply | Qty: 30 | Fill #1

## 2017-07-14 ENCOUNTER — Ambulatory Visit: Payer: Self-pay | Attending: Family Medicine | Admitting: Family Medicine

## 2017-07-14 ENCOUNTER — Encounter: Payer: Self-pay | Admitting: Family Medicine

## 2017-07-14 VITALS — BP 128/84 | HR 84 | Temp 98.0°F | Ht 67.0 in | Wt 245.2 lb

## 2017-07-14 DIAGNOSIS — M47816 Spondylosis without myelopathy or radiculopathy, lumbar region: Secondary | ICD-10-CM | POA: Insufficient documentation

## 2017-07-14 DIAGNOSIS — Z113 Encounter for screening for infections with a predominantly sexual mode of transmission: Secondary | ICD-10-CM | POA: Insufficient documentation

## 2017-07-14 DIAGNOSIS — Z87442 Personal history of urinary calculi: Secondary | ICD-10-CM | POA: Insufficient documentation

## 2017-07-14 DIAGNOSIS — Z79899 Other long term (current) drug therapy: Secondary | ICD-10-CM | POA: Insufficient documentation

## 2017-07-14 DIAGNOSIS — I10 Essential (primary) hypertension: Secondary | ICD-10-CM | POA: Insufficient documentation

## 2017-07-14 MED ORDER — CYCLOBENZAPRINE HCL 10 MG PO TABS: 10.0000 mg | ORAL_TABLET | Freq: Two times a day (BID) | ORAL | 1 refills | Status: DC | PRN

## 2017-07-14 MED FILL — CYCLOBENZAPRINE 10 MG TAB: 10 | 30 days supply | Qty: 60 | Fill #0

## 2017-07-14 NOTE — Progress Notes (Signed)
Subjective:  Patient ID: Kimberly Sosa, female    DOB: 11-10-1960  Age: 57 y.o. MRN: [id number]  CC: Hypertension   HPI Kimberly Sosa is a 57 year old female with a history of hypertension, degenerative disease of the lumbar spine who presents today to establish care with me.  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  She has been sexually active with  one female partner for the last couple of years and her last sexual intercourse was 6 weeks ago.  She denies dysuria, vaginal discharge, pelvic pain. For her degenerative disease of the lumbar spine she takes NSAIDs and was prescribed Robaxin at her last visit but would like to receive Flexeril which worked better in the past for her.  Her low back pain does not radiate down her lower extremities and is described as mild and intermittent.  Past Medical History:  Diagnosis Date  . Active smoker   . Back pain   . Carpal tunnel syndrome    LEFT HAND  . DJD (degenerative joint disease), lumbar   . Hypercholesterolemia   . Hypertension   . Kidney stone   . Pinched nerve in neck      Outpatient Medications Prior to Visit  Medication Sig Dispense Refill  . lisinopril-hydrochlorothiazide (PRINZIDE,ZESTORETIC) 10-12.5 MG tablet Take 1 tablet by mouth daily. 90 tablet 3  . traMADol (ULTRAM) 50 MG tablet Take 1 tablet (50 mg total) by mouth every 8 (eight) hours as needed for severe pain. 40 tablet 0  . methocarbamol (ROBAXIN) 500 MG tablet Take 1 tablet (500 mg total) by mouth every 6 (six) hours as needed for muscle spasms. 90 tablet 1  . acetaminophen (TYLENOL) 500 MG tablet Take 2 tablets (1,000 mg total) by mouth every 6 (six) hours as needed for moderate pain. (Patient not taking: Reported on 07/14/2017) 30 tablet 0  . ferrous sulfate (FERROUSUL) 325 (65 FE) MG tablet Take 1 tablet (325 mg total) by mouth 3 (three) times daily with meals. (Patient  not taking: Reported on 12/04/2016) 90 tablet 2   Facility-Administered Medications Prior to Visit  Medication Dose Route Frequency Provider Last Rate Last Dose  . 0.9 %  sodium chloride infusion  500 mL Intravenous Continuous Ladene Artist, MD        ROS Review of Systems  Constitutional: Negative for activity change, appetite change and fatigue.  HENT: Negative for congestion, sinus pressure and sore throat.   Eyes: Negative for visual disturbance.  Respiratory: Negative for cough, chest tightness, shortness of breath and wheezing.   Cardiovascular: Negative for chest pain and palpitations.  Gastrointestinal: Negative for abdominal distention, abdominal pain and constipation.  Endocrine: Negative for polydipsia.  Genitourinary: Negative for dysuria and frequency.  Musculoskeletal: Negative for arthralgias and back pain.  Skin: Negative for rash.  Neurological: Negative for tremors, light-headedness and numbness.  Hematological: Does not bruise/bleed easily.  Psychiatric/Behavioral: Negative for agitation and behavioral problems.    Objective:  BP 128/84   Pulse 84   Temp 98 F (36.7 C) (Oral)   Ht 5\' 7"  (1.702 m)   Wt 245 lb 3.2 oz (111.2 kg)   SpO2 98%   BMI 38.40 kg/m   BP/Weight 07/14/2017 06/03/2017 39/76/7341  Systolic BP 937 902 409  Diastolic BP 84 85 84  Wt. (Lbs) 245.2 246.6 221  BMI 38.4 38.62 34.61      Physical Exam  Constitutional:  She is oriented to person, place, and time. She appears well-developed and well-nourished.  Cardiovascular: Normal rate, normal heart sounds and intact distal pulses.  No murmur heard. Pulmonary/Chest: Effort normal and breath sounds normal. She has no wheezes. She has no rales. She exhibits no tenderness.  Abdominal: Soft. Bowel sounds are normal. She exhibits no distension and no mass. There is no tenderness.  Musculoskeletal: Normal range of motion.  Neurological: She is alert and oriented to person, place, and time.    Skin: Skin is warm and dry.  Psychiatric: She has a normal mood and affect.     Assessment & Plan:   1. Spondylosis of lumbar region without myelopathy or radiculopathy Switched from Robaxin to Flexeril as per patient request  2. Screening for STD (sexually transmitted disease) - Urine cytology ancillary only - HSV Type I/II IgG, IgMw/ reflex - RPR - VDRL, Serum - HIV antibody (with reflex)  3. Essential hypertension Controlled Counseled on blood pressure goal of less than 130/80, low-sodium, DASH diet, medication compliance, 150 minutes of moderate intensity exercise per week. Discussed medication compliance, adverse effects.    Meds ordered this encounter  Medications  . cyclobenzaprine (FLEXERIL) 10 MG tablet    Sig: Take 1 tablet (10 mg total) by mouth 2 (two) times daily as needed for muscle spasms.    Dispense:  60 tablet    Refill:  1    Discontinue Robaxin    Follow-up: Return in about 6 months (around 01/13/2018) for Follow-up of chronic medical conditions.   Charlott Rakes MD

## 2017-07-16 ENCOUNTER — Other Ambulatory Visit: Payer: Self-pay | Admitting: Family Medicine

## 2017-07-16 LAB — URINE CYTOLOGY ANCILLARY ONLY
Chlamydia: NEGATIVE
Neisseria Gonorrhea: NEGATIVE
Trichomonas: POSITIVE — AB

## 2017-07-16 MED ORDER — METRONIDAZOLE 500 MG PO TABS: 2000.0000 mg | ORAL_TABLET | Freq: Once | ORAL | 0 refills | Status: AC

## 2017-07-16 MED ORDER — METRONIDAZOLE 0.75 % VA GEL: 1.0000 | Freq: Every day | VAGINAL | 0 refills | Status: DC

## 2017-07-16 MED ORDER — DIPHENHYDRAMINE HCL 25 MG PO TABS: 25.0000 mg | ORAL_TABLET | Freq: Four times a day (QID) | ORAL | 0 refills | Status: DC | PRN

## 2017-07-16 NOTE — Progress Notes (Unsigned)
I sent a rx for metronidazole to her pharmacy for treatment of Trichomonas. I see she has itching when she takes it and so I have also sent Benadryl to use as well and Metronidazole is the standard of treatment for Trich.

## 2017-07-17 ENCOUNTER — Telehealth: Payer: Self-pay

## 2017-07-17 LAB — URINE CYTOLOGY ANCILLARY ONLY: Candida vaginitis: NEGATIVE

## 2017-07-17 MED FILL — metroNIDAZOLE 500 MG TABS: 500 | 4 days supply | Qty: 4 | Fill #0

## 2017-07-17 NOTE — Telephone Encounter (Signed)
Patient was called and informed of lab results and medication being sent to pharmacy. 

## 2017-07-21 LAB — NON-TREPONEMAL REFLEX TITER

## 2017-07-21 LAB — HSV TYPE I/II IGG, IGMW/ REFLEX
HSV 1 Glycoprotein G Ab, IgG: <0.91 index (ref 0.00–0.90)
HSV 2 IgG, Type Spec: 1.15 index — ABNORMAL HIGH (ref 0.00–0.90)
HSV 2 IgM: <1:10 {titer}

## 2017-07-21 LAB — HIV ANTIBODY (ROUTINE TESTING W REFLEX): HIV Screen 4th Generation wRfx: NONREACTIVE

## 2017-07-21 LAB — VDRL

## 2017-07-21 LAB — HSV-2 IGG SUPPLEMENTAL TEST: HSV-2 IGG SUPPLEMENTAL TEST: POSITIVE — AB

## 2017-07-21 LAB — RPR: RPR: NONREACTIVE

## 2017-07-31 MED FILL — LISINOPRIL-HCTZ 10-12.5 MG: 10-12.5 | 30 days supply | Qty: 30 | Fill #2

## 2017-09-01 MED FILL — LISINOPRIL-HCTZ 10-12.5 MG: 10-12.5 | 30 days supply | Qty: 30 | Fill #3

## 2017-09-29 MED FILL — LISINOPRIL-HCTZ 10-12.5 MG: 10-12.5 | 30 days supply | Qty: 30 | Fill #4

## 2017-11-04 MED FILL — LISINOPRIL-HCTZ 10-12.5 MG: 10-12.5 | 30 days supply | Qty: 30 | Fill #5

## 2017-11-04 MED FILL — CYCLOBENZAPRINE 10 MG TAB: 10 | 30 days supply | Qty: 60 | Fill #1

## 2017-12-07 MED FILL — LISINOPRIL-HCTZ 10-12.5 MG: 10-12.5 | 30 days supply | Qty: 30 | Fill #6

## 2018-01-06 MED FILL — LISINOPRIL-HCTZ 10-12.5 MG: 10-12.5 | 30 days supply | Qty: 30 | Fill #7

## 2018-02-05 MED FILL — LISINOPRIL-HCTZ 10-12.5 MG: 10-12.5 | 30 days supply | Qty: 30 | Fill #8

## 2018-03-08 MED FILL — LISINOPRIL-HCTZ 10-12.5 MG: 10-12.5 | 30 days supply | Qty: 30 | Fill #9

## 2018-03-09 IMAGING — DX DG HIP (WITH OR WITHOUT PELVIS) 1V PORT*L*
2 series · 2 of 2 positions shown · non-contrast
Comparison: None.

CLINICAL DATA: Status post left hip replacement today.

EXAM:
DG HIP (WITH OR WITHOUT PELVIS) 1V PORT LEFT

[pelvis ap]
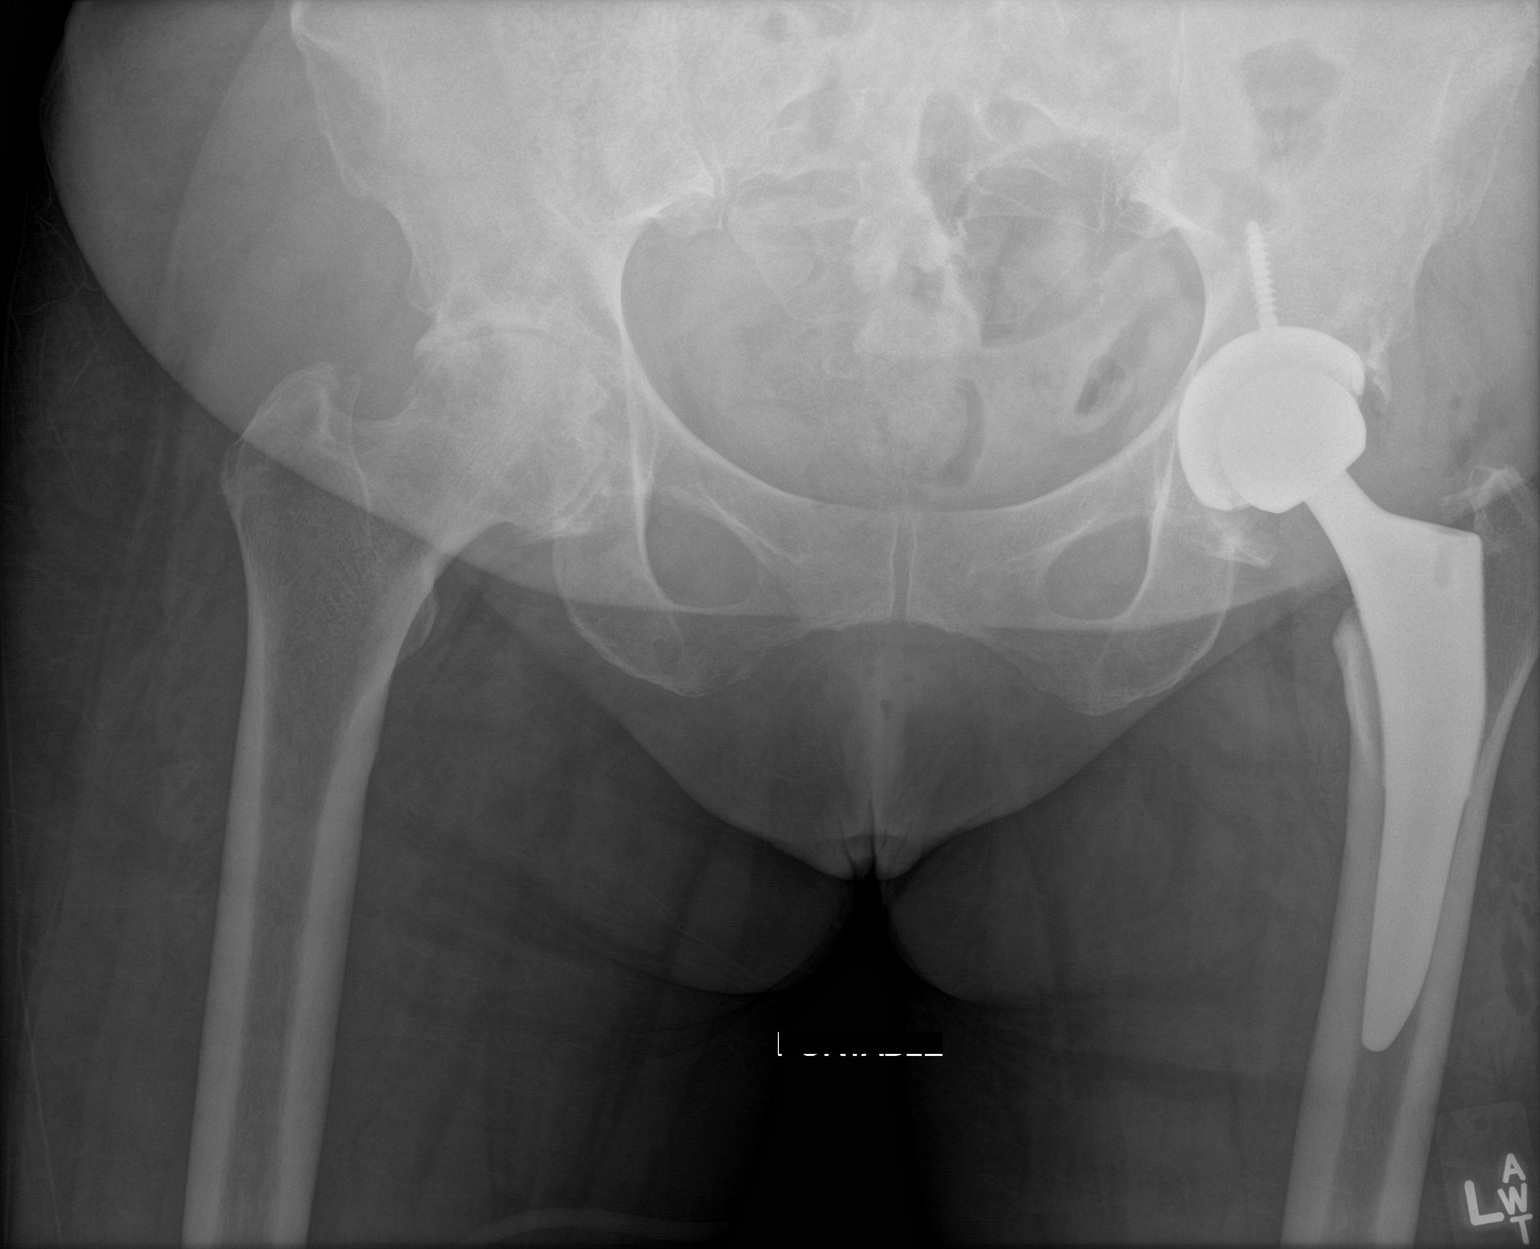

[hip frog leg]
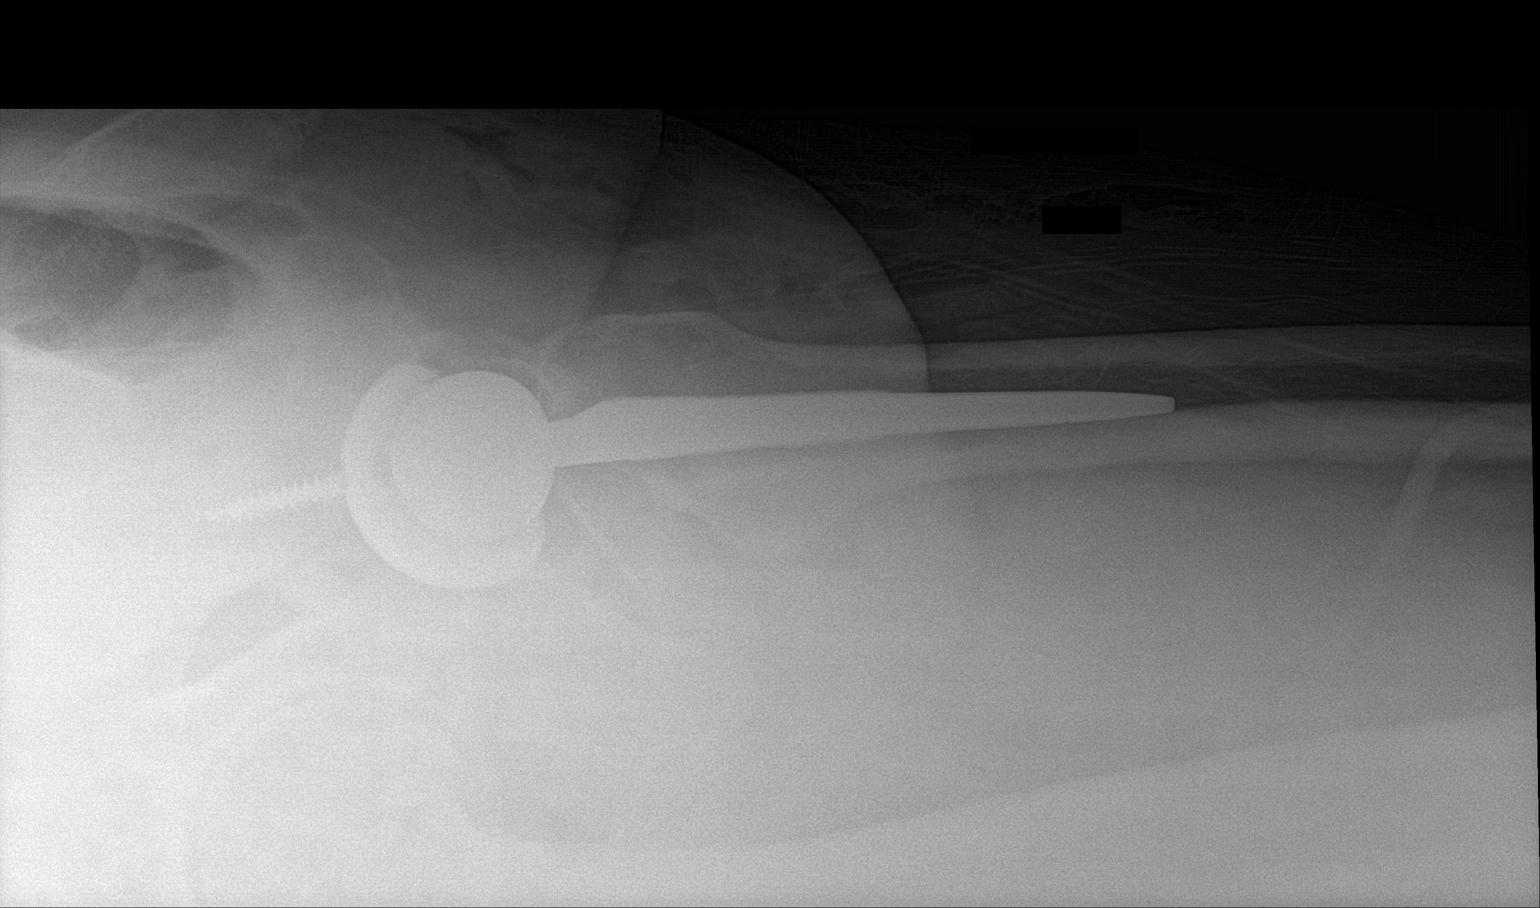

[2 of 2 positions shown; findings below may reference images not displayed]

FINDINGS: Left total hip arthroplasty is in place. The device is located. No
fracture. Severe right hip osteoarthritis is noted.
IMPRESSION: Status post left hip replacement.  No acute abnormality.

Severe right hip osteoarthritis.

## 2018-03-11 ENCOUNTER — Ambulatory Visit: Payer: Medicare Other | Attending: Family Medicine | Admitting: Physician Assistant

## 2018-03-11 VITALS — BP 106/68 | HR 74 | Temp 98.1°F | Ht 67.0 in | Wt 254.2 lb

## 2018-03-11 DIAGNOSIS — G5600 Carpal tunnel syndrome, unspecified upper limb: Secondary | ICD-10-CM | POA: Insufficient documentation

## 2018-03-11 DIAGNOSIS — Z7901 Long term (current) use of anticoagulants: Secondary | ICD-10-CM | POA: Diagnosis not present

## 2018-03-11 DIAGNOSIS — L298 Other pruritus: Secondary | ICD-10-CM | POA: Diagnosis not present

## 2018-03-11 DIAGNOSIS — I10 Essential (primary) hypertension: Secondary | ICD-10-CM | POA: Diagnosis not present

## 2018-03-11 DIAGNOSIS — E7800 Pure hypercholesterolemia, unspecified: Secondary | ICD-10-CM | POA: Insufficient documentation

## 2018-03-11 DIAGNOSIS — Z79899 Other long term (current) drug therapy: Secondary | ICD-10-CM | POA: Insufficient documentation

## 2018-03-11 DIAGNOSIS — M1990 Unspecified osteoarthritis, unspecified site: Secondary | ICD-10-CM | POA: Insufficient documentation

## 2018-03-11 DIAGNOSIS — R21 Rash and other nonspecific skin eruption: Secondary | ICD-10-CM | POA: Insufficient documentation

## 2018-03-11 DIAGNOSIS — M549 Dorsalgia, unspecified: Secondary | ICD-10-CM | POA: Diagnosis not present

## 2018-03-11 DIAGNOSIS — L299 Pruritus, unspecified: Secondary | ICD-10-CM

## 2018-03-11 MED ORDER — TRIAMCINOLONE ACETONIDE 0.1 % EX CREA: 1.0000 "application " | TOPICAL_CREAM | Freq: Two times a day (BID) | CUTANEOUS | 0 refills | Status: DC

## 2018-03-11 MED ORDER — CETIRIZINE HCL 10 MG PO TABS: 10.0000 mg | ORAL_TABLET | Freq: Every day | ORAL | 11 refills | Status: DC

## 2018-03-11 MED FILL — TRIAMCINOLONE ACETONIDE 0.1: 0.1 | 15 days supply | Qty: 30 | Fill #0

## 2018-03-11 NOTE — Progress Notes (Signed)
Patient ID: Kimberly Sosa, female   DOB: 06-Mar-1960, 58 y.o.   MRN: [id number]   Kimberly Sosa, is a 58 y.o. female  [id number]:[id number]  [id number]:[id number]  DOB - 11-Oct-1960  Subjective:  Chief Complaint and HPI: Kimberly Sosa is a 58 y.o. female here today with rash R arm and R mid abdomen.  She has had it for about 1 month.  She also had a rash back around thanksgiving after drinking some peach schnapps.  She is unsure what precipitated this rash.  Benadryl helps with itching. No f/c.  No bits.  Lives with multiple other family members and no one else is affected.    ROS:   Constitutional:  No f/c, No night sweats, No unexplained weight loss. EENT:  No vision changes, No blurry vision, No hearing changes. No mouth, throat, or ear problems.  Respiratory: No cough, No SOB Cardiac: No CP, no palpitations GI:  No abd pain, No N/V/D. GU: No Urinary s/sx Musculoskeletal: No joint pain Neuro: No headache, no dizziness, no motor weakness.  Skin: + rash Endocrine:  No polydipsia. No polyuria.  Psych: Denies SI/HI  No problems updated.  ALLERGIES: Allergies  Allergen Reactions  . Hydrocodone Other (See Comments)    Causes Headaches  . Metronidazole Itching  . Mobic [Meloxicam] Other (See Comments)    Causes Headaches  . Tetracyclines & Related Swelling    All over body swelling    PAST MEDICAL HISTORY: Past Medical History:  Diagnosis Date  . Active smoker   . Back pain   . Carpal tunnel syndrome    LEFT HAND  . DJD (degenerative joint disease), lumbar   . Hypercholesterolemia   . Hypertension   . Kidney stone   . Pinched nerve in neck     MEDICATIONS AT HOME: Prior to Admission medications   Medication Sig Start Date End Date Taking? Authorizing Provider  cyclobenzaprine (FLEXERIL) 10 MG tablet Take 1 tablet (10 mg total) by mouth 2 (two) times daily as needed for muscle spasms. 07/14/17  Yes Charlott Rakes, MD  diphenhydrAMINE (BENADRYL ALLERGY) 25 MG tablet Take 1  tablet (25 mg total) by mouth every 6 (six) hours as needed. 07/16/17  Yes Charlott Rakes, MD  lisinopril-hydrochlorothiazide (PRINZIDE,ZESTORETIC) 10-12.5 MG tablet Take 1 tablet by mouth daily. 06/03/17  Yes Argentina Donovan, PA-C  acetaminophen (TYLENOL) 500 MG tablet Take 2 tablets (1,000 mg total) by mouth every 6 (six) hours as needed for moderate pain. Patient not taking: Reported on 07/14/2017 10/28/16   Alfonse Spruce, FNP  cetirizine (ZYRTEC) 10 MG tablet Take 1 tablet (10 mg total) by mouth daily. 03/11/18   Argentina Donovan, PA-C  ferrous sulfate (FERROUSUL) 325 (65 FE) MG tablet Take 1 tablet (325 mg total) by mouth 3 (three) times daily with meals. Patient not taking: Reported on 12/04/2016 07/23/16   Alfonse Spruce, FNP  traMADol (ULTRAM) 50 MG tablet Take 1 tablet (50 mg total) by mouth every 8 (eight) hours as needed for severe pain. Patient not taking: Reported on 03/11/2018 10/28/16   Alfonse Spruce, FNP  triamcinolone cream (KENALOG) 0.1 % Apply 1 application topically 2 (two) times daily. 03/11/18   Argentina Donovan, PA-C     Objective:  EXAM:   Vitals:   03/11/18 1036  BP: 106/68  Pulse: 74  Temp: 98.1 F (36.7 C)  TempSrc: Oral  SpO2: 96%  Weight: 254 lb 3.2 oz (115.3 kg)  Height: 5\' 7"  (1.702 m)  General appearance : A&OX3. NAD. Non-toxic-appearing HEENT: Atraumatic and Normocephalic.  PERRLA. EOM intact. Chest/Lungs:  Breathing-non-labored, Good air entry bilaterally, breath sounds normal without rales, rhonchi, or wheezing  CVS: S1 S2 regular, no murmurs, gallops, rubs  Extremities: Bilateral Lower Ext shows no edema, both legs are warm to touch with = pulse throughout Neurology:  CN II-XII grossly intact, Non focal.   Psych:  TP linear. J/I WNL. Normal speech. Appropriate eye contact and affect.  Skin:  R arm lateral to the antecubital fossa(volar surface) there is a 3cm area of what appears to be urticaria/hive like-also a 5X5 cm area on the R  mid abdomen.  No sign of secondary infection.  Not present anywhere else.  No burrows.    Data Review Lab Results  Component Value Date   HGBA1C 5.9 (H) 06/03/2017     Assessment & Plan   1. Rash and nonspecific skin eruption Unclear etiology-if first line treatment doesn't help, see dermatology - triamcinolone cream (KENALOG) 0.1 %; Apply 1 application topically 2 (two) times daily.  Dispense: 30 g; Refill: 0 - cetirizine (ZYRTEC) 10 MG tablet; Take 1 tablet (10 mg total) by mouth daily.  Dispense: 30 tablet; Refill: 11 - Ambulatory referral to Dermatology  2. Itching - triamcinolone cream (KENALOG) 0.1 %; Apply 1 application topically 2 (two) times daily.  Dispense: 30 g; Refill: 0 - cetirizine (ZYRTEC) 10 MG tablet; Take 1 tablet (10 mg total) by mouth daily.  Dispense: 30 tablet; Refill: 11     Patient have been counseled extensively about nutrition and exercise  Return for for 04/13/2018 f/up with Dr Margarita Rana.  The patient was given clear instructions to go to ER or return to medical center if symptoms don't improve, worsen or new problems develop. The patient verbalized understanding. The patient was told to call to get lab results if they haven't heard anything in the next week.     Freeman Caldron, PA-C [facility] and [facility] [place], [place]   03/11/2018, 10:47 AM

## 2018-03-11 NOTE — Progress Notes (Signed)
Patient has rash on arm and abdomen.

## 2018-03-17 DIAGNOSIS — L299 Pruritus, unspecified: Secondary | ICD-10-CM | POA: Diagnosis not present

## 2018-03-17 DIAGNOSIS — L281 Prurigo nodularis: Secondary | ICD-10-CM | POA: Diagnosis not present

## 2018-03-17 DIAGNOSIS — L720 Epidermal cyst: Secondary | ICD-10-CM | POA: Diagnosis not present

## 2018-03-17 DIAGNOSIS — L309 Dermatitis, unspecified: Secondary | ICD-10-CM | POA: Diagnosis not present

## 2018-03-17 MED FILL — BETAMETHASONE DP AUG 0.05%: 0.05 | 7 days supply | Qty: 30 | Fill #0

## 2018-04-07 MED FILL — LISINOPRIL-HCTZ 10-12.5 MG: 10-12.5 | 30 days supply | Qty: 30 | Fill #10

## 2018-04-13 ENCOUNTER — Ambulatory Visit: Payer: Medicare Other | Attending: Family Medicine | Admitting: Family Medicine

## 2018-04-13 ENCOUNTER — Encounter: Payer: Self-pay | Admitting: Family Medicine

## 2018-04-13 VITALS — BP 129/79 | HR 72 | Temp 97.7°F | Ht 67.0 in | Wt 253.4 lb

## 2018-04-13 DIAGNOSIS — Z96643 Presence of artificial hip joint, bilateral: Secondary | ICD-10-CM | POA: Insufficient documentation

## 2018-04-13 DIAGNOSIS — L309 Dermatitis, unspecified: Secondary | ICD-10-CM | POA: Insufficient documentation

## 2018-04-13 DIAGNOSIS — Z8249 Family history of ischemic heart disease and other diseases of the circulatory system: Secondary | ICD-10-CM | POA: Insufficient documentation

## 2018-04-13 DIAGNOSIS — Z885 Allergy status to narcotic agent status: Secondary | ICD-10-CM | POA: Insufficient documentation

## 2018-04-13 DIAGNOSIS — M25561 Pain in right knee: Secondary | ICD-10-CM | POA: Insufficient documentation

## 2018-04-13 DIAGNOSIS — G8929 Other chronic pain: Secondary | ICD-10-CM | POA: Diagnosis not present

## 2018-04-13 DIAGNOSIS — Z79899 Other long term (current) drug therapy: Secondary | ICD-10-CM | POA: Diagnosis not present

## 2018-04-13 DIAGNOSIS — Z833 Family history of diabetes mellitus: Secondary | ICD-10-CM | POA: Insufficient documentation

## 2018-04-13 DIAGNOSIS — Z8042 Family history of malignant neoplasm of prostate: Secondary | ICD-10-CM | POA: Diagnosis not present

## 2018-04-13 DIAGNOSIS — I10 Essential (primary) hypertension: Secondary | ICD-10-CM | POA: Insufficient documentation

## 2018-04-13 DIAGNOSIS — A530 Latent syphilis, unspecified as early or late: Secondary | ICD-10-CM | POA: Diagnosis not present

## 2018-04-13 DIAGNOSIS — M25551 Pain in right hip: Secondary | ICD-10-CM | POA: Diagnosis not present

## 2018-04-13 DIAGNOSIS — Z888 Allergy status to other drugs, medicaments and biological substances status: Secondary | ICD-10-CM | POA: Diagnosis not present

## 2018-04-13 DIAGNOSIS — Z881 Allergy status to other antibiotic agents status: Secondary | ICD-10-CM | POA: Insufficient documentation

## 2018-04-13 DIAGNOSIS — M47816 Spondylosis without myelopathy or radiculopathy, lumbar region: Secondary | ICD-10-CM | POA: Diagnosis not present

## 2018-04-13 DIAGNOSIS — F17200 Nicotine dependence, unspecified, uncomplicated: Secondary | ICD-10-CM | POA: Insufficient documentation

## 2018-04-13 MED ORDER — TRAMADOL HCL 50 MG PO TABS: 50.0000 mg | ORAL_TABLET | Freq: Two times a day (BID) | ORAL | 0 refills | Status: DC | PRN

## 2018-04-13 MED ORDER — LISINOPRIL-HYDROCHLOROTHIAZIDE 10-12.5 MG PO TABS: 1.0000 | ORAL_TABLET | Freq: Every day | ORAL | 1 refills | Status: DC

## 2018-04-13 MED FILL — traMADol HCL 50 MG TABS: 50 | 30 days supply | Qty: 60 | Fill #0

## 2018-04-13 NOTE — Patient Instructions (Signed)

## 2018-04-13 NOTE — Progress Notes (Signed)
Subjective:  Patient ID: Kimberly Sosa, female    DOB: 1960-06-09  Age: 58 y.o. MRN: [id number]  CC: Hypertension   HPI Kimberly Sosa is a 58 year old female with a history of hypertension, degenerative disease of the lumbar spine who presents today for follow-up visit. She was recently seen by dermatology due to chronic pruritus in her elbows, lower back and posterior thigh and is currently on topical creams, antihistamine with no improvement in symptoms.  Plan as per Dermatology is for return visit in 2 weeks and possible biopsy if symptoms persist. She complains of chronic back pain which is intermittent and worse when she is doing dishes to the extent that she has to bend over for relief.  She alternates Flexeril and tramadol and Aleve.  Pain does not radiate down her lower extremities and is rated as moderate to severe when intense but at this time is absent. She is status post right total hip arthroplasty and feels her right hip is doing well. She has intermittent right knee pain with a remote history of right knee trauma.  Recently she has noticed pain in the lateral aspect especially when she has to get up suddenly while attending to outpatient with dementia.  Past Medical History:  Diagnosis Date  . Active smoker   . Back pain   . Carpal tunnel syndrome    LEFT HAND  . DJD (degenerative joint disease), lumbar   . Hypercholesterolemia   . Hypertension   . Kidney stone   . Pinched nerve in neck     Past Surgical History:  Procedure Laterality Date  . APPENDECTOMY    . ROTATOR CUFF REPAIR    . 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 TOTAL HIP ARTHROPLASTY Right 07/08/2016   Procedure: RIGHT TOTAL HIP ARTHROPLASTY ANTERIOR APPROACH;  Surgeon: Paralee Cancel, MD;  Location: [facility];  Service: Orthopedics;   Laterality: Right;    Family History  Problem Relation Age of Onset  . Diabetes Mother   . Hypertension Mother   . Heart failure Mother   . Prostate cancer Father   . Diabetes Father   . Diabetes Sister   . Diabetes Brother        3 brothers deceased - CHF  . Healthy Sister   . Colon cancer Neg Hx   . Rectal cancer Neg Hx   . Esophageal cancer Neg Hx   . Liver cancer Neg Hx     Allergies  Allergen Reactions  . Hydrocodone Other (See Comments)    Causes Headaches  . Metronidazole Itching  . Mobic [Meloxicam] Other (See Comments)    Causes Headaches  . Tetracyclines & Related Swelling    All over body swelling    Outpatient Medications Prior to Visit  Medication Sig Dispense Refill  . cetirizine (ZYRTEC) 10 MG tablet Take 1 tablet (10 mg total) by mouth daily. 30 tablet 11  . cyclobenzaprine (FLEXERIL) 10 MG tablet Take 1 tablet (10 mg total) by mouth 2 (two) times daily as needed for muscle spasms. 60 tablet 1  . diphenhydrAMINE (BENADRYL ALLERGY) 25 MG tablet Take 1 tablet (25 mg total) by mouth every 6 (six) hours as needed. 20 tablet 0  . ferrous sulfate (FERROUSUL) 325 (65 FE) MG tablet Take 1 tablet (325 mg total) by mouth 3 (three) times daily  with meals. 90 tablet 2  . lisinopril-hydrochlorothiazide (PRINZIDE,ZESTORETIC) 10-12.5 MG tablet Take 1 tablet by mouth daily. 90 tablet 3  . traMADol (ULTRAM) 50 MG tablet Take 1 tablet (50 mg total) by mouth every 8 (eight) hours as needed for severe pain. 40 tablet 0  . acetaminophen (TYLENOL) 500 MG tablet Take 2 tablets (1,000 mg total) by mouth every 6 (six) hours as needed for moderate pain. (Patient not taking: Reported on 07/14/2017) 30 tablet 0  . triamcinolone cream (KENALOG) 0.1 % Apply 1 application topically 2 (two) times daily. (Patient not taking: Reported on 04/13/2018) 30 g 0   Facility-Administered Medications Prior to Visit  Medication Dose Route Frequency Provider Last Rate Last Dose  . 0.9 %  sodium chloride  infusion  500 mL Intravenous Continuous Ladene Artist, MD         ROS Review of Systems  Constitutional: Negative for activity change, appetite change and fatigue.  HENT: Negative for congestion, sinus pressure and sore throat.   Eyes: Negative for visual disturbance.  Respiratory: Negative for cough, chest tightness, shortness of breath and wheezing.   Cardiovascular: Negative for chest pain and palpitations.  Gastrointestinal: Negative for abdominal distention, abdominal pain and constipation.  Endocrine: Negative for polydipsia.  Genitourinary: Negative for dysuria and frequency.  Musculoskeletal:       See hpi  Skin: Positive for rash.  Neurological: Negative for tremors, light-headedness and numbness.  Hematological: Does not bruise/bleed easily.  Psychiatric/Behavioral: Negative for agitation and behavioral problems.    Objective:  BP 129/79   Pulse 72   Temp 97.7 F (36.5 C) (Oral)   Ht 5' 7"  (1.702 m)   Wt 253 lb 6.4 oz (114.9 kg)   SpO2 100%   BMI 39.69 kg/m   BP/Weight 04/13/2018 03/11/2018 5/79/0383  Systolic BP 338 329 191  Diastolic BP 79 68 84  Wt. (Lbs) 253.4 254.2 245.2  BMI 39.69 39.81 38.4      Physical Exam Constitutional:      Appearance: She is well-developed.  Cardiovascular:     Rate and Rhythm: Normal rate.     Heart sounds: Normal heart sounds. No murmur.  Pulmonary:     Effort: Pulmonary effort is normal.     Breath sounds: Normal breath sounds. No wheezing or rales.  Chest:     Chest wall: No tenderness.  Abdominal:     General: Bowel sounds are normal. There is no distension.     Palpations: Abdomen is soft. There is no mass.     Tenderness: There is no abdominal tenderness.  Musculoskeletal:     Comments: No edema of right knee noted Tenderness to palpation of lateral aspect of right knee joint with associated tenderness on range of motion Number spine exam is normal with normal range of motion; negative straight leg raise    Skin:    Comments: Hypopigmented rash on lateral aspects of both elbows with scratch marks.  Scaly hyperpigmented rash in right lumbar region  Neurological:     Mental Status: She is alert and oriented to person, place, and time.  Psychiatric:        Mood and Affect: Mood normal.     CMP Latest Ref Rng & Units 06/03/2017 07/09/2016 07/02/2016  Glucose 65 - 99 mg/dL 104(H) 116(H) 99  BUN 6 - 24 mg/dL 17 19 25(H)  Creatinine 0.57 - 1.00 mg/dL 1.04(H) 0.80 1.20(H)  Sodium 134 - 144 mmol/L 140 139 142  Potassium 3.5 - 5.2 mmol/L  3.9 3.6 4.1  Chloride 96 - 106 mmol/L 104 109 109  CO2 20 - 29 mmol/L 22 24 26   Calcium 8.7 - 10.2 mg/dL 9.9 8.4(L) 9.6  Total Protein 6.0 - 8.5 g/dL 7.3 - -  Total Bilirubin 0.0 - 1.2 mg/dL 0.2 - -  Alkaline Phos 39 - 117 IU/L 114 - -  AST 0 - 40 IU/L 11 - -  ALT 0 - 32 IU/L 10 - -    Lipid Panel     Component Value Date/Time   CHOL 215 (H) 08/26/2016 0000   TRIG 69 08/26/2016 0000   HDL 61 08/26/2016 0000   CHOLHDL 3.5 08/26/2016 0000   LDLCALC 140 (H) 08/26/2016 0000    CBC    Component Value Date/Time   WBC 11.6 (H) 06/03/2017 1559   WBC 14.2 (H) 07/09/2016 0509   RBC 4.25 06/03/2017 1559   RBC 3.10 (L) 07/09/2016 0509   HGB 12.5 06/03/2017 1559   HCT 38.1 06/03/2017 1559   PLT 446 (H) 06/03/2017 1559   MCV 90 06/03/2017 1559   MCH 29.4 06/03/2017 1559   MCH 29.0 07/09/2016 0509   MCHC 32.8 06/03/2017 1559   MCHC 33.0 07/09/2016 0509   RDW 14.4 06/03/2017 1559   LYMPHSABS 4.2 (H) 06/03/2017 1559   EOSABS 0.1 06/03/2017 1559   BASOSABS 0.0 06/03/2017 1559    Lab Results  Component Value Date   HGBA1C 5.9 (H) 06/03/2017    Assessment & Plan:   1. Essential hypertension Controlled Counseled on blood pressure goal of less than 130/80, low-sodium, DASH diet, medication compliance, 150 minutes of moderate intensity exercise per week. Discussed medication compliance, adverse effects. - lisinopril-hydrochlorothiazide  (PRINZIDE,ZESTORETIC) 10-12.5 MG tablet; Take 1 tablet by mouth daily.  Dispense: 90 tablet; Refill: 1 - CMP14+EGFR; Future - Lipid panel; Future  2. Right knee pain Advised to use knee brace - traMADol (ULTRAM) 50 MG tablet; Take 1 tablet (50 mg total) by mouth every 12 (twelve) hours as needed for severe pain.  Dispense: 60 tablet; Refill: 0  3. Spondylosis of lumbar region without myelopathy or radiculopathy With intermittent flares Continue Flexeril, tramadol Apply heat  4. Dermatitis Uncontrolled Continue with antihistamine, topical creams, avoid scented products Currently followed by dermatology   Meds ordered this encounter  Medications  . lisinopril-hydrochlorothiazide (PRINZIDE,ZESTORETIC) 10-12.5 MG tablet    Sig: Take 1 tablet by mouth daily.    Dispense:  90 tablet    Refill:  1  . traMADol (ULTRAM) 50 MG tablet    Sig: Take 1 tablet (50 mg total) by mouth every 12 (twelve) hours as needed for severe pain.    Dispense:  60 tablet    Refill:  0    Follow-up: Return in about 6 months (around 10/14/2018) for Follow-up of chronic medical conditions.       Charlott Rakes, MD, FAAFP. [facility] and [place], [place]   04/13/2018, 3:38 PM

## 2018-04-13 NOTE — Addendum Note (Signed)
Addended by: Charlott Rakes on: 04/13/2018 05:40 PM   Modules accepted: Orders

## 2018-04-13 NOTE — Progress Notes (Signed)
Patient request tramadol refill.

## 2018-04-14 ENCOUNTER — Other Ambulatory Visit: Payer: Self-pay

## 2018-04-14 ENCOUNTER — Ambulatory Visit: Payer: Medicare Other | Attending: Family Medicine

## 2018-04-14 DIAGNOSIS — I10 Essential (primary) hypertension: Secondary | ICD-10-CM | POA: Diagnosis not present

## 2018-04-14 DIAGNOSIS — Z0000 Encounter for general adult medical examination without abnormal findings: Secondary | ICD-10-CM | POA: Diagnosis not present

## 2018-04-14 DIAGNOSIS — A530 Latent syphilis, unspecified as early or late: Secondary | ICD-10-CM

## 2018-04-14 DIAGNOSIS — Z1159 Encounter for screening for other viral diseases: Secondary | ICD-10-CM | POA: Diagnosis not present

## 2018-04-14 DIAGNOSIS — Z113 Encounter for screening for infections with a predominantly sexual mode of transmission: Secondary | ICD-10-CM

## 2018-04-16 ENCOUNTER — Other Ambulatory Visit: Payer: Self-pay | Admitting: Family Medicine

## 2018-04-16 LAB — LIPID PANEL
Chol/HDL Ratio: 4.2 ratio (ref 0.0–4.4)
Cholesterol, Total: 230 mg/dL — ABNORMAL HIGH (ref 100–199)
HDL: 55 mg/dL (ref >39)
LDL Calculated: 156 mg/dL — ABNORMAL HIGH (ref 0–99)
TRIGLYCERIDES: 95 mg/dL (ref 0–149)
VLDL Cholesterol Cal: 19 mg/dL (ref 5–40)

## 2018-04-16 LAB — CMP14+EGFR
ALT: 10 IU/L (ref 0–32)
AST: 15 IU/L (ref 0–40)
Albumin/Globulin Ratio: 1.7 (ref 1.2–2.2)
Albumin: 4.3 g/dL (ref 3.8–4.9)
Alkaline Phosphatase: 127 IU/L — ABNORMAL HIGH (ref 39–117)
BUN/Creatinine Ratio: 12 (ref 9–23)
BUN: 13 mg/dL (ref 6–24)
Bilirubin Total: 0.3 mg/dL (ref 0.0–1.2)
CALCIUM: 9.5 mg/dL (ref 8.7–10.2)
CO2: 19 mmol/L — ABNORMAL LOW (ref 20–29)
Chloride: 105 mmol/L (ref 96–106)
Creatinine, Ser: 1.11 mg/dL — ABNORMAL HIGH (ref 0.57–1.00)
GFR, EST AFRICAN AMERICAN: 64 mL/min/{1.73_m2} (ref >59)
GFR, EST NON AFRICAN AMERICAN: 55 mL/min/{1.73_m2} — AB (ref >59)
Globulin, Total: 2.6 g/dL (ref 1.5–4.5)
Glucose: 103 mg/dL — ABNORMAL HIGH (ref 65–99)
Potassium: 4.1 mmol/L (ref 3.5–5.2)
Sodium: 142 mmol/L (ref 134–144)
Total Protein: 6.9 g/dL (ref 6.0–8.5)

## 2018-04-16 LAB — FLUORESCENT TREPONEMAL AB(FTA)-IGG-BLD: Fluorescent Treponemal Ab, IgG: REACTIVE — AB

## 2018-04-16 MED ORDER — ATORVASTATIN CALCIUM 20 MG PO TABS: 20.0000 mg | ORAL_TABLET | Freq: Every day | ORAL | 3 refills | Status: DC

## 2018-04-19 ENCOUNTER — Telehealth: Payer: Self-pay | Admitting: Family Medicine

## 2018-04-19 NOTE — Telephone Encounter (Signed)
[facility] called in wanting to know if pt could get a rpr order to get added  because she had a history of syphilis

## 2018-04-19 NOTE — Telephone Encounter (Signed)
Kimberly Sosa was called and RPR was added on to patient lab work that was drawn on 04/14/18

## 2018-04-19 NOTE — Addendum Note (Signed)
Addended by: Octaviano Glow on: 04/19/2018 04:30 PM   Modules accepted: Orders

## 2018-04-22 LAB — RPR, QUANT+TP ABS (REFLEX)
Rapid Plasma Reagin, Quant: 1:4 {titer} — ABNORMAL HIGH
T Pallidum Abs: REACTIVE — AB

## 2018-04-22 LAB — RPR: RPR Ser Ql: REACTIVE — AB

## 2018-04-22 LAB — SPECIMEN STATUS REPORT

## 2018-05-06 ENCOUNTER — Telehealth: Payer: Self-pay | Admitting: Family Medicine

## 2018-05-06 NOTE — Telephone Encounter (Signed)
Pt called in to verify address for med delivery  [address] [place] [address]

## 2018-05-06 NOTE — Telephone Encounter (Signed)
Pharmacy notified.

## 2018-05-08 MED FILL — ATORVASTATIN 20 MG TABLET: 20 | 30 days supply | Qty: 30 | Fill #0

## 2018-05-08 MED FILL — LISINOPRIL-HCTZ 10-12.5 MG: 10-12.5 | 30 days supply | Qty: 30 | Fill #11

## 2018-06-07 ENCOUNTER — Telehealth: Payer: Self-pay | Admitting: Family Medicine

## 2018-06-07 NOTE — Telephone Encounter (Signed)
I will set patient up for a phone visit for tomorrow if someone cancels or does not pick up. Is that ok with you.

## 2018-06-07 NOTE — Telephone Encounter (Signed)
New Message  Pt states that her Cholesterol medication is making her "heart beat funny" and making her "stomach loose" also states she is suppose to have some refills on her Lisinopril but she doesn't. Please f/u

## 2018-06-07 NOTE — Telephone Encounter (Signed)
I will see her tomorrow

## 2018-06-08 ENCOUNTER — Encounter: Payer: Self-pay | Admitting: Family Medicine

## 2018-06-08 ENCOUNTER — Other Ambulatory Visit: Payer: Self-pay

## 2018-06-08 ENCOUNTER — Ambulatory Visit: Payer: Medicare Other | Attending: Family Medicine | Admitting: Family Medicine

## 2018-06-08 DIAGNOSIS — Z1239 Encounter for other screening for malignant neoplasm of breast: Secondary | ICD-10-CM | POA: Diagnosis not present

## 2018-06-08 DIAGNOSIS — E7800 Pure hypercholesterolemia, unspecified: Secondary | ICD-10-CM | POA: Diagnosis not present

## 2018-06-08 DIAGNOSIS — I10 Essential (primary) hypertension: Secondary | ICD-10-CM | POA: Diagnosis not present

## 2018-06-08 MED ORDER — PRAVASTATIN SODIUM 40 MG PO TABS: 40.0000 mg | ORAL_TABLET | Freq: Every day | ORAL | 1 refills | Status: DC

## 2018-06-08 MED ORDER — LISINOPRIL-HYDROCHLOROTHIAZIDE 10-12.5 MG PO TABS: 1.0000 | ORAL_TABLET | Freq: Every day | ORAL | 1 refills | Status: DC

## 2018-06-08 MED FILL — LISINOPRIL-HCTZ 10-12.5 MG: 10-12.5 | 30 days supply | Qty: 30 | Fill #0

## 2018-06-08 MED FILL — PRAVASTATIN NA 40 MG TAB: 40 | 30 days supply | Qty: 30 | Fill #0

## 2018-06-08 NOTE — Progress Notes (Signed)
Virtual Visit via Telephone Note  I connected with Kimberly Sosa, on 06/08/2018 at 9:32 AM by telephone and verified that I am speaking with the correct person using two identifiers.   Consent: I discussed the limitations, risks, security and privacy concerns of performing an evaluation and management service by telephone and the availability of in person appointments. I also discussed with the patient that there may be a patient responsible charge related to this service. The patient expressed understanding and agreed to proceed.   Location of Patient: Home  Location of Provider: Clinic   Persons participating in Telemedicine visit: Victoriah J Avelina Laine Farrington-CMA Dr. Felecia Shelling    History of Present Illness: Kimberly Sosa is a 58 year old female with a history of hypertension, degenerative disease of the lumbar spine who presents today with complaints of palpitations, stomach upset ever since she was commenced on Lipitor.  Palpitations occurs at night and she complains "it tore my stomach up".  Symptoms resolved when she discontinued Lipitor. She also would like to have a mammogram as she missed previous appointments due to her hip problems but then a couple of days ago she noticed burning in her left breast which lasted a few seconds then resolved.  She is asymptomatic at this time and has no lumps in her breast. The knee pain she previously complained of has improved with the knee brace and her elbow rash have improved.  Past Medical History:  Diagnosis Date  . Active smoker   . Back pain   . Carpal tunnel syndrome    LEFT HAND  . DJD (degenerative joint disease), lumbar   . Hypercholesterolemia   . Hypertension   . Kidney stone   . Pinched nerve in neck    Allergies  Allergen Reactions  . Hydrocodone Other (See Comments)    Causes Headaches  . Metronidazole Itching  . Mobic [Meloxicam] Other (See Comments)    Causes Headaches  . Tetracyclines &  Related Swelling    All over body swelling    Current Outpatient Medications on File Prior to Visit  Medication Sig Dispense Refill  . cetirizine (ZYRTEC) 10 MG tablet Take 1 tablet (10 mg total) by mouth daily. 30 tablet 11  . cyclobenzaprine (FLEXERIL) 10 MG tablet Take 1 tablet (10 mg total) by mouth 2 (two) times daily as needed for muscle spasms. 60 tablet 1  . diphenhydrAMINE (BENADRYL ALLERGY) 25 MG tablet Take 1 tablet (25 mg total) by mouth every 6 (six) hours as needed. 20 tablet 0  . lisinopril-hydrochlorothiazide (PRINZIDE,ZESTORETIC) 10-12.5 MG tablet Take 1 tablet by mouth daily. 90 tablet 1  . traMADol (ULTRAM) 50 MG tablet Take 1 tablet (50 mg total) by mouth every 12 (twelve) hours as needed for severe pain. 60 tablet 0  . acetaminophen (TYLENOL) 500 MG tablet Take 2 tablets (1,000 mg total) by mouth every 6 (six) hours as needed for moderate pain. (Patient not taking: Reported on 07/14/2017) 30 tablet 0  . atorvastatin (LIPITOR) 20 MG tablet Take 1 tablet (20 mg total) by mouth daily. (Patient not taking: Reported on 06/08/2018) 30 tablet 3  . ferrous sulfate (FERROUSUL) 325 (65 FE) MG tablet Take 1 tablet (325 mg total) by mouth 3 (three) times daily with meals. (Patient not taking: Reported on 06/08/2018) 90 tablet 2  . triamcinolone cream (KENALOG) 0.1 % Apply 1 application topically 2 (two) times daily. (Patient not taking: Reported on 04/13/2018) 30 g 0   Current Facility-Administered Medications on File Prior to  Visit  Medication Dose Route Frequency Provider Last Rate Last Dose  . 0.9 %  sodium chloride infusion  500 mL Intravenous Continuous Ladene Artist, MD        Observations/Objective: Awake, alert, oriented x3 Not in acute distress  CMP Latest Ref Rng & Units 04/14/2018 06/03/2017 07/09/2016  Glucose 65 - 99 mg/dL 103(H) 104(H) 116(H)  BUN 6 - 24 mg/dL 13 17 19   Creatinine 0.57 - 1.00 mg/dL 1.11(H) 1.04(H) 0.80  Sodium 134 - 144 mmol/L 142 140 139  Potassium 3.5 -  5.2 mmol/L 4.1 3.9 3.6  Chloride 96 - 106 mmol/L 105 104 109  CO2 20 - 29 mmol/L 19(L) 22 24  Calcium 8.7 - 10.2 mg/dL 9.5 9.9 8.4(L)  Total Protein 6.0 - 8.5 g/dL 6.9 7.3 -  Total Bilirubin 0.0 - 1.2 mg/dL 0.3 0.2 -  Alkaline Phos 39 - 117 IU/L 127(H) 114 -  AST 0 - 40 IU/L 15 11 -  ALT 0 - 32 IU/L 10 10 -    Lipid Panel     Component Value Date/Time   CHOL 230 (H) 04/14/2018 0932   TRIG 95 04/14/2018 0932   HDL 55 04/14/2018 0932   CHOLHDL 4.2 04/14/2018 0932   LDLCALC 156 (H) 04/14/2018 0932    The 10-year ASCVD risk score Mikey Bussing DC Jr., et al., 2013) is: 13.5%   Values used to calculate the score:     Age: 59 years     Sex: Female     Is Non-Hispanic African American: Yes     Diabetic: No     Tobacco smoker: Yes     Systolic Blood Pressure: 676 mmHg     Is BP treated: Yes     HDL Cholesterol: 55 mg/dL     Total Cholesterol: 230 mg/dL  Assessment and Plan: 1. Hypercholesterolemia Uncontrolled with 10 year ASCVD risk of 13.5% Continue Lipitor due to intolerance and commence pravastatin Compliant with low-cholesterol diet - pravastatin (PRAVACHOL) 40 MG tablet; Take 1 tablet (40 mg total) by mouth daily.  Dispense: 90 tablet; Refill: 1  2. Screening for breast cancer - MM DIGITAL SCREENING BILATERAL; Future  3. Essential hypertension Blood pressure was controlled at last office visit - lisinopril-hydrochlorothiazide (ZESTORETIC) 10-12.5 MG tablet; Take 1 tablet by mouth daily.  Dispense: 90 tablet; Refill: 1   Follow Up Instructions: Return in about 3 months (around 09/08/2018).    I discussed the assessment and treatment plan with the patient. The patient was provided an opportunity to ask questions and all were answered. The patient agreed with the plan and demonstrated an understanding of the instructions.   The patient was advised to call back or seek an in-person evaluation if the symptoms worsen or if the condition fails to improve as anticipated.     I  provided 15 minutes total of non-face-to-face time during this encounter including median intraservice time, reviewing previous notes, labs, imaging, medications and explaining diagnosis and management.     Charlott Rakes, MD, FAAFP. [facility] and [place], [place]   06/08/2018, 9:32 AM

## 2018-06-08 NOTE — Progress Notes (Signed)
Patient has been called and DOB has been verified. Patient has been screened and transferred to PCP to start phone visit.   Patient would like to discuss cholesterol medication.  Patient wants a mammogram.

## 2018-06-15 ENCOUNTER — Other Ambulatory Visit: Payer: Self-pay

## 2018-06-15 ENCOUNTER — Ambulatory Visit
Admission: RE | Admit: 2018-06-15 | Discharge: 2018-06-15 | Disposition: A | Discharge status: Home / Self Care | Payer: Medicare Other | Source: Ambulatory Visit | Attending: Family Medicine | Admitting: Family Medicine

## 2018-06-15 DIAGNOSIS — Z1239 Encounter for other screening for malignant neoplasm of breast: Secondary | ICD-10-CM

## 2018-06-15 DIAGNOSIS — Z1231 Encounter for screening mammogram for malignant neoplasm of breast: Secondary | ICD-10-CM | POA: Diagnosis not present

## 2018-06-17 ENCOUNTER — Other Ambulatory Visit: Payer: Self-pay | Admitting: Family Medicine

## 2018-06-17 DIAGNOSIS — R928 Other abnormal and inconclusive findings on diagnostic imaging of breast: Secondary | ICD-10-CM

## 2018-06-21 ENCOUNTER — Telehealth: Payer: Self-pay | Admitting: Family Medicine

## 2018-06-21 NOTE — Telephone Encounter (Signed)
Patient called stating she was prescribed pravastatin (PRAVACHOL) 40 MG tablet  Patient stated she took it on Friday and Saturday and patient stated that the med gave her a head ache. Patient states that she did not take it this morning. Please f/u with pt.

## 2018-06-21 NOTE — Telephone Encounter (Signed)
Patients call returned.  Patient identified by name and date of birth.  Patient was recently prescribed pravastatin.  Patient states each time she took the medication she had a dull headache in the evening.  Patient states the headache is located over the right eye.  Patient states that the headache is not present in the morning.  Patient states she stopped taking the medication Sunday morning and had no headache Sunday night.  Patient advised that Dr. Lynnda Child be notified and information would be returned when present.  Patient acknowledged understanding of advice.

## 2018-06-21 NOTE — Telephone Encounter (Addendum)
She was unable to tolerate Lipitor and now she is unable to tolerate pravastatin; it is uncertain that the headache is related to have progressed.  Her ASCVD risk score is 13.5% which is the risk of developing a stroke or heart attack.  I would recommend remaining on a statin, low-cholesterol diet and lifestyle modifications.

## 2018-06-23 NOTE — Telephone Encounter (Signed)
Patients call returned.  Patient identified by name and date of birth.  Patient advised as to Dr. Smitty Pluck instructions  Patient acknowledged understanding of advice.

## 2018-06-24 ENCOUNTER — Telehealth: Payer: Self-pay

## 2018-06-24 NOTE — Telephone Encounter (Signed)
Patient was called and informed of mammogram results 

## 2018-06-24 NOTE — Telephone Encounter (Signed)
-----   Message from Charlott Rakes, MD sent at 06/18/2018 10:51 AM EDT ----- Additional imaging is required due to abnormal mammogram and she will be contacted by the radiology office to set this up.

## 2018-06-25 ENCOUNTER — Other Ambulatory Visit: Payer: Self-pay

## 2018-06-25 ENCOUNTER — Other Ambulatory Visit: Payer: Self-pay | Admitting: Family Medicine

## 2018-06-25 ENCOUNTER — Ambulatory Visit
Admission: RE | Admit: 2018-06-25 | Discharge: 2018-06-25 | Disposition: A | Discharge status: Home / Self Care | Payer: Medicare Other | Source: Ambulatory Visit | Attending: Family Medicine | Admitting: Family Medicine

## 2018-06-25 DIAGNOSIS — R921 Mammographic calcification found on diagnostic imaging of breast: Secondary | ICD-10-CM

## 2018-06-25 DIAGNOSIS — N6310 Unspecified lump in the right breast, unspecified quadrant: Secondary | ICD-10-CM

## 2018-06-25 DIAGNOSIS — R928 Other abnormal and inconclusive findings on diagnostic imaging of breast: Secondary | ICD-10-CM

## 2018-07-02 ENCOUNTER — Ambulatory Visit
Admission: RE | Admit: 2018-07-02 | Discharge: 2018-07-02 | Disposition: A | Discharge status: Home / Self Care | Payer: Medicare Other | Source: Ambulatory Visit | Attending: Family Medicine | Admitting: Family Medicine

## 2018-07-02 ENCOUNTER — Other Ambulatory Visit: Payer: Self-pay

## 2018-07-02 ENCOUNTER — Telehealth: Payer: Self-pay

## 2018-07-02 DIAGNOSIS — N6315 Unspecified lump in the right breast, overlapping quadrants: Secondary | ICD-10-CM | POA: Diagnosis not present

## 2018-07-02 DIAGNOSIS — R590 Localized enlarged lymph nodes: Secondary | ICD-10-CM | POA: Diagnosis not present

## 2018-07-02 DIAGNOSIS — N6310 Unspecified lump in the right breast, unspecified quadrant: Secondary | ICD-10-CM

## 2018-07-02 DIAGNOSIS — R921 Mammographic calcification found on diagnostic imaging of breast: Secondary | ICD-10-CM | POA: Diagnosis not present

## 2018-07-02 HISTORY — PX: BREAST BIOPSY: SHX20

## 2018-07-02 NOTE — Telephone Encounter (Signed)
I spoke to her over the phone and she wanted to know if it was imperative that she took a cholesterol pill.  I again emphasized her increased cardiovascular risk of 13.2% and the need for her to remain on statin in addition to lifestyle modifications and she promised to comply.

## 2018-07-02 NOTE — Telephone Encounter (Signed)
Patient want to talk about her Cholesterol medication. Patient would like to speak with you.

## 2018-07-02 NOTE — Telephone Encounter (Signed)
Patient was called and informed of mammogram results and biopsy appointment.

## 2018-07-02 NOTE — Telephone Encounter (Signed)
-----   Message from Ladell Pier, MD sent at 06/25/2018  3:52 PM EDT ----- Let patient know that we have received the results of her mammogram.  The radiologist recommends biopsy of the right breast.  It is scheduled for 5/29.  Make sure that she keeps that appointment.

## 2018-07-14 MED FILL — PRAVASTATIN NA 40 MG TAB: 40 | 30 days supply | Qty: 30 | Fill #1

## 2018-07-14 MED FILL — LISINOPRIL-HCTZ 10-12.5 TAB: 10-12.5 | 30 days supply | Qty: 30 | Fill #1

## 2018-08-17 ENCOUNTER — Telehealth: Payer: Self-pay | Admitting: Family Medicine

## 2018-08-17 DIAGNOSIS — E7800 Pure hypercholesterolemia, unspecified: Secondary | ICD-10-CM

## 2018-08-17 MED FILL — LISINOPRIL-HCTZ 10-12.5 MG: 10-12.5 | 30 days supply | Qty: 30 | Fill #2

## 2018-08-17 NOTE — Telephone Encounter (Signed)
I have ordered future labs for her against 2 weeks as we had changed her cholesterol pill and will need to follow-up on her lipid panel.

## 2018-08-17 NOTE — Telephone Encounter (Signed)
Patient had lipid panel drawn 4 months ago. Is patient due for another test.

## 2018-08-17 NOTE — Telephone Encounter (Signed)
Call pt [phone]. Pt has not had cholesterol labs since March 2020. Please advise if order for labs needs placed and contact pt to come for labs if needed prior to fu appt 09/15/18 with [place].

## 2018-08-18 NOTE — Telephone Encounter (Signed)
Patient was called and given a lab appointment for 2 weeks.

## 2018-09-01 ENCOUNTER — Ambulatory Visit: Payer: Medicare Other | Attending: Family Medicine

## 2018-09-01 ENCOUNTER — Other Ambulatory Visit: Payer: Self-pay

## 2018-09-01 DIAGNOSIS — E7800 Pure hypercholesterolemia, unspecified: Secondary | ICD-10-CM | POA: Diagnosis not present

## 2018-09-01 MED FILL — PRAVASTATIN NA 40 MG TAB: 40 | 30 days supply | Qty: 30 | Fill #2

## 2018-09-02 LAB — LIPID PANEL
Chol/HDL Ratio: 3 ratio (ref 0.0–4.4)
Cholesterol, Total: 181 mg/dL (ref 100–199)
HDL: 61 mg/dL (ref >39)
LDL Calculated: 106 mg/dL — ABNORMAL HIGH (ref 0–99)
Triglycerides: 72 mg/dL (ref 0–149)
VLDL Cholesterol Cal: 14 mg/dL (ref 5–40)

## 2018-09-15 ENCOUNTER — Encounter: Payer: Self-pay | Admitting: Family Medicine

## 2018-09-15 ENCOUNTER — Other Ambulatory Visit: Payer: Self-pay

## 2018-09-15 ENCOUNTER — Ambulatory Visit: Payer: Medicare Other | Attending: Family Medicine | Admitting: Family Medicine

## 2018-09-15 DIAGNOSIS — M47816 Spondylosis without myelopathy or radiculopathy, lumbar region: Secondary | ICD-10-CM | POA: Diagnosis not present

## 2018-09-15 DIAGNOSIS — R6889 Other general symptoms and signs: Secondary | ICD-10-CM

## 2018-09-15 DIAGNOSIS — E7800 Pure hypercholesterolemia, unspecified: Secondary | ICD-10-CM | POA: Diagnosis not present

## 2018-09-15 DIAGNOSIS — Z130 Encounter for screening for diseases of the blood and blood-forming organs and certain disorders involving the immune mechanism: Secondary | ICD-10-CM

## 2018-09-15 MED FILL — LISINOPRIL-HCTZ 10-12.5 MG: 10-12.5 | 30 days supply | Qty: 30 | Fill #3

## 2018-09-15 NOTE — Progress Notes (Signed)
Virtual Visit via Telephone Note  I connected with Kimberly Sosa, on 09/15/2018 at 8:45 AM by telephone due to the COVID-19 pandemic and verified that I am speaking with the correct person using two identifiers.   Consent: I discussed the limitations, risks, security and privacy concerns of performing an evaluation and management service by telephone and the availability of in person appointments. I also discussed with the patient that there may be a patient responsible charge related to this service. The patient expressed understanding and agreed to proceed.   Location of Patient: Home  Location of Provider: Clinic   Persons participating in Telemedicine visit: Rachelle Maurine Mowbray Farrington-CMA Dr. Felecia Shelling     History of Present Illness: Kimberly Sosa is a 58 year old female with a history of hypertension, hypercholesterolemia, degenerative disease of the lumbar spine. She complains of cold intolerance and is wondering if she is anemic.  Symptoms have been present ever since she had her son several years ago.  Also has back spasms which are intermittent and precipitated by activity, worse with prolonged standing.  She does have Flexeril on her med list but does not like to take too many medications.  Pain does not radiate down her lower extremity and can be moderate to severe.  At her last visit she was switched from Lipitor to pravastatin due to GI complaints and reports doing well on pravastatin; she has changed her diet and cut back on fried foods and trans-fats and this has brought about significant improvement in her cholesterol from a total cholesterol of 230 to 181  Past Medical History:  Diagnosis Date  . Active smoker   . Back pain   . Carpal tunnel syndrome    LEFT HAND  . DJD (degenerative joint disease), lumbar   . Hypercholesterolemia   . Hypertension   . Kidney stone   . Pinched nerve in neck    Allergies  Allergen Reactions  .  Hydrocodone Other (See Comments)    Causes Headaches  . Metronidazole Itching  . Mobic [Meloxicam] Other (See Comments)    Causes Headaches  . Tetracyclines & Related Swelling    All over body swelling    Current Outpatient Medications on File Prior to Visit  Medication Sig Dispense Refill  . cetirizine (ZYRTEC) 10 MG tablet Take 1 tablet (10 mg total) by mouth daily. 30 tablet 11  . cyclobenzaprine (FLEXERIL) 10 MG tablet Take 1 tablet (10 mg total) by mouth 2 (two) times daily as needed for muscle spasms. 60 tablet 1  . diphenhydrAMINE (BENADRYL ALLERGY) 25 MG tablet Take 1 tablet (25 mg total) by mouth every 6 (six) hours as needed. 20 tablet 0  . lisinopril-hydrochlorothiazide (ZESTORETIC) 10-12.5 MG tablet Take 1 tablet by mouth daily. 90 tablet 1  . pravastatin (PRAVACHOL) 40 MG tablet Take 1 tablet (40 mg total) by mouth daily. 90 tablet 1  . traMADol (ULTRAM) 50 MG tablet Take 1 tablet (50 mg total) by mouth every 12 (twelve) hours as needed for severe pain. 60 tablet 0  . acetaminophen (TYLENOL) 500 MG tablet Take 2 tablets (1,000 mg total) by mouth every 6 (six) hours as needed for moderate pain. (Patient not taking: Reported on 07/14/2017) 30 tablet 0  . ferrous sulfate (FERROUSUL) 325 (65 FE) MG tablet Take 1 tablet (325 mg total) by mouth 3 (three) times daily with meals. (Patient not taking: Reported on 06/08/2018) 90 tablet 2  . triamcinolone cream (KENALOG) 0.1 % Apply 1 application topically 2 (  two) times daily. (Patient not taking: Reported on 04/13/2018) 30 g 0   Current Facility-Administered Medications on File Prior to Visit  Medication Dose Route Frequency Provider Last Rate Last Dose  . 0.9 %  sodium chloride infusion  500 mL Intravenous Continuous Ladene Artist, MD        Observations/Objective: Alert, awake, oriented x3 Not in acute distress  CMP Latest Ref Rng & Units 04/14/2018 06/03/2017 07/09/2016  Glucose 65 - 99 mg/dL 103(H) 104(H) 116(H)  BUN 6 - 24 mg/dL 13  17 19   Creatinine 0.57 - 1.00 mg/dL 1.11(H) 1.04(H) 0.80  Sodium 134 - 144 mmol/L 142 140 139  Potassium 3.5 - 5.2 mmol/L 4.1 3.9 3.6  Chloride 96 - 106 mmol/L 105 104 109  CO2 20 - 29 mmol/L 19(L) 22 24  Calcium 8.7 - 10.2 mg/dL 9.5 9.9 8.4(L)  Total Protein 6.0 - 8.5 g/dL 6.9 7.3 -  Total Bilirubin 0.0 - 1.2 mg/dL 0.3 0.2 -  Alkaline Phos 39 - 117 IU/L 127(H) 114 -  AST 0 - 40 IU/L 15 11 -  ALT 0 - 32 IU/L 10 10 -    Lipid Panel     Component Value Date/Time   CHOL 181 09/01/2018 1110   TRIG 72 09/01/2018 1110   HDL 61 09/01/2018 1110   CHOLHDL 3.0 09/01/2018 1110   LDLCALC 106 (H) 09/01/2018 1110     Assessment and Plan: 1. Hypercholesterolemia Controlled Continue pravastatin, low-cholesterol diet  2. Cold intolerance - CBC with Differential/Platelet; Future - TSH; Future - T4, free; Future  3. Spondylosis of lumbar region without myelopathy or radiculopathy Could explain her back muscle spasms Advised to apply heat Use Flexeril at bedtime as needed Home exercise regimen  4. Screening for deficiency anemia We will check CBC   Follow Up Instructions: 3 months in person   I discussed the assessment and treatment plan with the patient. The patient was provided an opportunity to ask questions and all were answered. The patient agreed with the plan and demonstrated an understanding of the instructions.   The patient was advised to call back or seek an in-person evaluation if the symptoms worsen or if the condition fails to improve as anticipated.     I provided 11 minutes total of non-face-to-face time during this encounter including median intraservice time, reviewing previous notes, labs, imaging, medications, management and patient verbalized understanding.     Charlott Rakes, MD, FAAFP. [facility] and [place], [place]   09/15/2018, 8:45 AM

## 2018-09-15 NOTE — Progress Notes (Signed)
Patient has been called and DOB has been verified. Patient has been screened and transferred to PCP to start phone visit.  Patient has been having back spasms.   Patient states that she gets cold and wants to be checked for anemia.

## 2018-09-16 ENCOUNTER — Ambulatory Visit: Payer: Medicare Other | Attending: Family Medicine

## 2018-09-16 ENCOUNTER — Other Ambulatory Visit: Payer: Self-pay

## 2018-09-16 DIAGNOSIS — R6889 Other general symptoms and signs: Secondary | ICD-10-CM

## 2018-09-17 LAB — CBC WITH DIFFERENTIAL/PLATELET
Basophils Absolute: 0.1 10*3/uL (ref 0.0–0.2)
Basos: 0 %
EOS (ABSOLUTE): 0.2 10*3/uL (ref 0.0–0.4)
Eos: 1 %
Hematocrit: 39.7 % (ref 34.0–46.6)
Hemoglobin: 13 g/dL (ref 11.1–15.9)
Immature Grans (Abs): 0 10*3/uL (ref 0.0–0.1)
Immature Granulocytes: 0 %
Lymphocytes Absolute: 5.3 10*3/uL — ABNORMAL HIGH (ref 0.7–3.1)
Lymphs: 39 %
MCH: 29.3 pg (ref 26.6–33.0)
MCHC: 32.7 g/dL (ref 31.5–35.7)
MCV: 90 fL (ref 79–97)
Monocytes Absolute: 0.9 10*3/uL (ref 0.1–0.9)
Monocytes: 7 %
Neutrophils Absolute: 7.3 10*3/uL — ABNORMAL HIGH (ref 1.4–7.0)
Neutrophils: 53 %
Platelets: 418 10*3/uL (ref 150–450)
RBC: 4.43 x10E6/uL (ref 3.77–5.28)
RDW: 13.3 % (ref 11.7–15.4)
WBC: 13.8 10*3/uL — ABNORMAL HIGH (ref 3.4–10.8)

## 2018-09-17 LAB — TSH: TSH: 1.85 u[IU]/mL (ref 0.450–4.500)

## 2018-09-17 LAB — T4, FREE: Free T4: 0.95 ng/dL (ref 0.82–1.77)

## 2018-09-22 ENCOUNTER — Telehealth: Payer: Self-pay

## 2018-09-22 NOTE — Telephone Encounter (Signed)
-----   Message from Charlott Rakes, MD sent at 09/17/2018 10:16 AM EDT ----- Thyroid labs are normal and labs do not reveal anemia.  White blood cells are slightly elevated which can happen with an infection, a cold.  No change in regimen recommended at this time.

## 2018-09-22 NOTE — Telephone Encounter (Signed)
Patient name and DOB has been verified Patient was informed of lab results. Patient had no questions.  

## 2018-09-30 MED FILL — PRAVASTATIN NA 40 MG TAB: 40 | 30 days supply | Qty: 30 | Fill #3

## 2018-10-14 MED FILL — LISINOPRIL-HCTZ 10-12.5 MG: 10-12.5 | 30 days supply | Qty: 30 | Fill #4

## 2018-10-28 MED FILL — PRAVASTATIN NA 40 MG TAB: 40 | 30 days supply | Qty: 30 | Fill #4

## 2018-11-10 MED FILL — LISINOPRIL-HCTZ 10-12.5 MG: 10-12.5 | 30 days supply | Qty: 30 | Fill #5

## 2018-11-29 MED FILL — PRAVASTATIN NA 40 MG TAB: 40 | 30 days supply | Qty: 30 | Fill #5

## 2018-12-10 ENCOUNTER — Telehealth: Payer: Self-pay | Admitting: Family Medicine

## 2018-12-10 MED FILL — LISINOPRIL-HCTZ 10-12.5 MG: 10-12.5 | 30 days supply | Qty: 30 | Fill #0

## 2018-12-10 NOTE — Telephone Encounter (Signed)
1) Medication(s) Requested (by name): lisinopril-hydrochlorothiazide (ZESTORETIC) 10-12.5 MG tablet [id number]   2) Pharmacy of Choice: [facility] [place]  3) Special Requests:   Approved medications will be sent to the pharmacy, we will reach out if there is an issue.  Requests made after 3pm may not be addressed until the following business day!  If a patient is unsure of the name of the medication(s) please note and ask patient to call back when they are able to provide all info, do not send to responsible party until all information is available!

## 2018-12-10 NOTE — Telephone Encounter (Signed)
Rx is ready for pick-up. Pt called and informed.

## 2018-12-20 ENCOUNTER — Encounter: Payer: Self-pay | Admitting: Family Medicine

## 2018-12-20 ENCOUNTER — Ambulatory Visit: Payer: Medicare Other | Attending: Family Medicine | Admitting: Family Medicine

## 2018-12-20 ENCOUNTER — Other Ambulatory Visit: Payer: Self-pay

## 2018-12-20 VITALS — BP 144/72 | HR 93 | Temp 98.5°F | Ht 67.0 in | Wt 249.0 lb

## 2018-12-20 DIAGNOSIS — M549 Dorsalgia, unspecified: Secondary | ICD-10-CM | POA: Diagnosis not present

## 2018-12-20 DIAGNOSIS — I10 Essential (primary) hypertension: Secondary | ICD-10-CM | POA: Diagnosis not present

## 2018-12-20 DIAGNOSIS — M47816 Spondylosis without myelopathy or radiculopathy, lumbar region: Secondary | ICD-10-CM | POA: Insufficient documentation

## 2018-12-20 DIAGNOSIS — M62830 Muscle spasm of back: Secondary | ICD-10-CM | POA: Diagnosis not present

## 2018-12-20 DIAGNOSIS — Z8249 Family history of ischemic heart disease and other diseases of the circulatory system: Secondary | ICD-10-CM | POA: Diagnosis not present

## 2018-12-20 DIAGNOSIS — Z7901 Long term (current) use of anticoagulants: Secondary | ICD-10-CM | POA: Insufficient documentation

## 2018-12-20 DIAGNOSIS — Z720 Tobacco use: Secondary | ICD-10-CM

## 2018-12-20 DIAGNOSIS — M62838 Other muscle spasm: Secondary | ICD-10-CM | POA: Diagnosis not present

## 2018-12-20 DIAGNOSIS — E7800 Pure hypercholesterolemia, unspecified: Secondary | ICD-10-CM | POA: Diagnosis not present

## 2018-12-20 DIAGNOSIS — F17210 Nicotine dependence, cigarettes, uncomplicated: Secondary | ICD-10-CM | POA: Insufficient documentation

## 2018-12-20 DIAGNOSIS — Z79899 Other long term (current) drug therapy: Secondary | ICD-10-CM | POA: Insufficient documentation

## 2018-12-20 MED ORDER — NICOTINE 7 MG/24HR TD PT24: 7.0000 mg | MEDICATED_PATCH | Freq: Every day | TRANSDERMAL | 1 refills | Status: DC

## 2018-12-20 MED ORDER — CYCLOBENZAPRINE HCL 10 MG PO TABS: 10.0000 mg | ORAL_TABLET | Freq: Two times a day (BID) | ORAL | 2 refills | Status: DC | PRN

## 2018-12-20 MED ORDER — LISINOPRIL-HYDROCHLOROTHIAZIDE 10-12.5 MG PO TABS: 1.0000 | ORAL_TABLET | Freq: Every day | ORAL | 1 refills | Status: DC

## 2018-12-20 MED ORDER — PRAVASTATIN SODIUM 40 MG PO TABS: 40.0000 mg | ORAL_TABLET | Freq: Every day | ORAL | 1 refills | Status: DC

## 2018-12-20 MED FILL — NICOTINE 7 MG/24HR PATCH: 7 | 28 days supply | Qty: 28 | Fill #0

## 2018-12-20 MED FILL — CYCLOBENZAPRINE 10 MG TAB: 10 | 30 days supply | Qty: 60 | Fill #0

## 2018-12-20 NOTE — Progress Notes (Signed)
Patient is having back spasm in the middle of her back.

## 2018-12-20 NOTE — Progress Notes (Signed)
Subjective:  Patient ID: Kimberly Sosa, female    DOB: Apr 09, 1960  Age: 58 y.o. MRN: [id number]  CC: Hypertension and Back Pain   HPI Kimberly Sosa is a 58 year old female with a history of hypertension, hypercholesterolemia, degenerative disease of the lumbar spine here for follow-up visit. She complains of back muscle spasms which increase with doing dishes and pain is in her left thoracolumbar region and radiates to her lower back but not down her lower extremities.  Application of heating pad provides some relief.  Tramadol also produces relief for about 30 minutes.  Pain causes her to bend over. Review of her med list indicates she was on Flexeril which she has not used since this spring.  We discussed PT at her last office visit 3 months ago but she was not open to this.  Pain is described as moderate to severe.  With regards to her hypertension she has been compliant with her antihypertensives and also tolerating her statin with no complaints of myalgia.  Past Medical History:  Diagnosis Date  . Active smoker   . Back pain   . Carpal tunnel syndrome    LEFT HAND  . DJD (degenerative joint disease), lumbar   . Hypercholesterolemia   . Hypertension   . Kidney stone   . Pinched nerve in neck     Past Surgical History:  Procedure Laterality Date  . APPENDECTOMY    . ROTATOR CUFF REPAIR    . 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 TOTAL HIP ARTHROPLASTY Right 07/08/2016   Procedure: RIGHT TOTAL HIP ARTHROPLASTY ANTERIOR APPROACH;  Surgeon: Paralee Cancel, MD;  Location: [facility];  Service: Orthopedics;  Laterality: Right;    Family History  Problem Relation Age of Onset  . Diabetes Mother   . Hypertension Mother   . Heart failure Mother   . Prostate cancer Father   . Diabetes Father   . Diabetes  Sister   . Diabetes Brother        3 brothers deceased - CHF  . Healthy Sister   . Breast cancer Maternal Grandmother   . Colon cancer Neg Hx   . Rectal cancer Neg Hx   . Esophageal cancer Neg Hx   . Liver cancer Neg Hx     Allergies  Allergen Reactions  . Hydrocodone Other (See Comments)    Causes Headaches  . Metronidazole Itching  . Mobic [Meloxicam] Other (See Comments)    Causes Headaches  . Tetracyclines & Related Swelling    All over body swelling    Outpatient Medications Prior to Visit  Medication Sig Dispense Refill  . cetirizine (ZYRTEC) 10 MG tablet Take 1 tablet (10 mg total) by mouth daily. 30 tablet 11  . diphenhydrAMINE (BENADRYL ALLERGY) 25 MG tablet Take 1 tablet (25 mg total) by mouth every 6 (six) hours as needed. 20 tablet 0  . traMADol (ULTRAM) 50 MG tablet Take 1 tablet (50 mg total) by mouth every 12 (twelve) hours as needed for severe pain. 60 tablet 0  . cyclobenzaprine (FLEXERIL) 10 MG tablet Take 1 tablet (10 mg total) by mouth 2 (two) times daily as needed for muscle spasms. 60 tablet 1  . lisinopril-hydrochlorothiazide (ZESTORETIC) 10-12.5 MG tablet Take 1 tablet by mouth daily. 90 tablet 1  . pravastatin (PRAVACHOL) 40 MG tablet Take  1 tablet (40 mg total) by mouth daily. 90 tablet 1  . acetaminophen (TYLENOL) 500 MG tablet Take 2 tablets (1,000 mg total) by mouth every 6 (six) hours as needed for moderate pain. (Patient not taking: Reported on 07/14/2017) 30 tablet 0  . ferrous sulfate (FERROUSUL) 325 (65 FE) MG tablet Take 1 tablet (325 mg total) by mouth 3 (three) times daily with meals. (Patient not taking: Reported on 06/08/2018) 90 tablet 2  . triamcinolone cream (KENALOG) 0.1 % Apply 1 application topically 2 (two) times daily. (Patient not taking: Reported on 04/13/2018) 30 g 0   Facility-Administered Medications Prior to Visit  Medication Dose Route Frequency Provider Last Rate Last Dose  . 0.9 %  sodium chloride infusion  500 mL Intravenous  Continuous Ladene Artist, MD         ROS Review of Systems  Constitutional: Negative for activity change, appetite change and fatigue.  HENT: Negative for congestion, sinus pressure and sore throat.   Eyes: Negative for visual disturbance.  Respiratory: Negative for cough, chest tightness, shortness of breath and wheezing.   Cardiovascular: Negative for chest pain and palpitations.  Gastrointestinal: Negative for abdominal distention, abdominal pain and constipation.  Endocrine: Negative for polydipsia.  Genitourinary: Negative for dysuria and frequency.  Musculoskeletal: Positive for back pain. Negative for arthralgias.  Skin: Negative for rash.  Neurological: Negative for tremors, light-headedness and numbness.  Hematological: Does not bruise/bleed easily.  Psychiatric/Behavioral: Negative for agitation and behavioral problems.    Objective:  BP (!) 144/72   Pulse 93   Temp 98.5 F (36.9 C) (Oral)   Ht 5\' 7"  (1.702 m)   Wt 249 lb (112.9 kg)   SpO2 99%   BMI 39.00 kg/m   BP/Weight 12/20/2018 123456 [id number]  Systolic BP 123456 [id number]  Diastolic BP 72 79 68  Wt. (Lbs) 249 253.4 254.2  BMI 39 39.69 39.81      Physical Exam Constitutional:      Appearance: She is well-developed.  Neck:     Vascular: No JVD.  Cardiovascular:     Rate and Rhythm: Normal rate.     Heart sounds: Normal heart sounds. No murmur.  Pulmonary:     Effort: Pulmonary effort is normal.     Breath sounds: Normal breath sounds. No wheezing or rales.  Chest:     Chest wall: No tenderness.  Abdominal:     General: Bowel sounds are normal. There is no distension.     Palpations: Abdomen is soft. There is no mass.     Tenderness: There is no abdominal tenderness.  Musculoskeletal: Normal range of motion.        General: No tenderness.     Right lower leg: No edema.     Left lower leg: No edema.     Comments: Normal appearance of lumbar spine Negative straight leg raise bilaterally   Neurological:     Mental Status: She is alert and oriented to person, place, and time.  Psychiatric:        Mood and Affect: Mood normal.     CMP Latest Ref Rng & Units 04/14/2018 06/03/2017 07/09/2016  Glucose 65 - 99 mg/dL 103(H) 104(H) 116(H)  BUN 6 - 24 mg/dL 13 17 19   Creatinine 0.57 - 1.00 mg/dL 1.11(H) 1.04(H) 0.80  Sodium 134 - 144 mmol/L 142 140 139  Potassium 3.5 - 5.2 mmol/L 4.1 3.9 3.6  Chloride 96 - 106 mmol/L 105 104 109  CO2 20 - 29 mmol/L  19(L) 22 24  Calcium 8.7 - 10.2 mg/dL 9.5 9.9 8.4(L)  Total Protein 6.0 - 8.5 g/dL 6.9 7.3 -  Total Bilirubin 0.0 - 1.2 mg/dL 0.3 0.2 -  Alkaline Phos 39 - 117 IU/L 127(H) 114 -  AST 0 - 40 IU/L 15 11 -  ALT 0 - 32 IU/L 10 10 -    Lipid Panel     Component Value Date/Time   CHOL 181 09/01/2018 1110   TRIG 72 09/01/2018 1110   HDL 61 09/01/2018 1110   CHOLHDL 3.0 09/01/2018 1110   LDLCALC 106 (H) 09/01/2018 1110    CBC    Component Value Date/Time   WBC 13.8 (H) 09/16/2018 1017   WBC 14.2 (H) 07/09/2016 0509   RBC 4.43 09/16/2018 1017   RBC 3.10 (L) 07/09/2016 0509   HGB 13.0 09/16/2018 1017   HCT 39.7 09/16/2018 1017   PLT 418 09/16/2018 1017   MCV 90 09/16/2018 1017   MCH 29.3 09/16/2018 1017   MCH 29.0 07/09/2016 0509   MCHC 32.7 09/16/2018 1017   MCHC 33.0 07/09/2016 0509   RDW 13.3 09/16/2018 1017   LYMPHSABS 5.3 (H) 09/16/2018 1017   EOSABS 0.2 09/16/2018 1017   BASOSABS 0.1 09/16/2018 1017    Lab Results  Component Value Date   HGBA1C 5.9 (H) 06/03/2017    Assessment & Plan:   1. Hypercholesterolemia Controlled Low-cholesterol diet - pravastatin (PRAVACHOL) 40 MG tablet; Take 1 tablet (40 mg total) by mouth daily.  Dispense: 90 tablet; Refill: 1  2. Essential hypertension Controlled Counseled on blood pressure goal of less than 130/80, low-sodium, DASH diet, medication compliance, 150 minutes of moderate intensity exercise per week. Discussed medication compliance, adverse effects. -  lisinopril-hydrochlorothiazide (ZESTORETIC) 10-12.5 MG tablet; Take 1 tablet by mouth daily.  Dispense: 90 tablet; Refill: 1  3. Spondylosis of lumbar region without myelopathy or radiculopathy Could explain low back pain She declines PT at this time She has been out of Flexeril which I have refilled and she will continue with tramadol Advised to apply heat Counseled on home exercise regimen including yoga exercises which have been known to be beneficial. - cyclobenzaprine (FLEXERIL) 10 MG tablet; Take 1 tablet (10 mg total) by mouth 2 (two) times daily as needed for muscle spasms.  Dispense: 60 tablet; Refill: 2  4. Muscle spasm See #3 above - cyclobenzaprine (FLEXERIL) 10 MG tablet; Take 1 tablet (10 mg total) by mouth 2 (two) times daily as needed for muscle spasms.  Dispense: 60 tablet; Refill: 2  5. Tobacco abuse Spent 3 minutes counseling on cessation including hazardous effects of tobacco use and she is willing to work on quitting.  We will prescribe nicotine patches. - nicotine (NICODERM CQ) 7 mg/24hr patch; Place 1 patch (7 mg total) onto the skin daily.  Dispense: 28 patch; Refill: 1    Meds ordered this encounter  Medications  . cyclobenzaprine (FLEXERIL) 10 MG tablet    Sig: Take 1 tablet (10 mg total) by mouth 2 (two) times daily as needed for muscle spasms.    Dispense:  60 tablet    Refill:  2  . pravastatin (PRAVACHOL) 40 MG tablet    Sig: Take 1 tablet (40 mg total) by mouth daily.    Dispense:  90 tablet    Refill:  1    Please dispense 90 supply  . lisinopril-hydrochlorothiazide (ZESTORETIC) 10-12.5 MG tablet    Sig: Take 1 tablet by mouth daily.    Dispense:  90  tablet    Refill:  1    Please dispense 90 supply  . nicotine (NICODERM CQ) 7 mg/24hr patch    Sig: Place 1 patch (7 mg total) onto the skin daily.    Dispense:  28 patch    Refill:  1    Follow-up: Return in about 6 months (around 06/19/2019) for medical conditions.       Charlott Rakes, MD,  FAAFP. [facility] and [place], [place]   12/20/2018, 9:51 AM

## 2018-12-29 MED FILL — PRAVASTATIN NA 40 MG TAB: 40 | 30 days supply | Qty: 30 | Fill #0

## 2019-01-10 MED FILL — LISINOPRIL-HCTZ 10-12.5 MG: 10-12.5 | 30 days supply | Qty: 30 | Fill #0

## 2019-01-26 MED FILL — PRAVASTATIN NA 40 MG TAB: 40 | 30 days supply | Qty: 30 | Fill #1

## 2019-02-08 MED FILL — LISINOPRIL-HCTZ 10-12.5 MG: 10-12.5 | 30 days supply | Qty: 30 | Fill #1

## 2019-02-28 MED FILL — PRAVASTATIN NA 40 MG TAB: 40 | 30 days supply | Qty: 30 | Fill #2

## 2019-03-10 MED FILL — LISINOPRIL-HCTZ 10-12.5 MG: 10-12.5 | 30 days supply | Qty: 30 | Fill #2

## 2019-03-30 MED FILL — PRAVASTATIN NA 40 MG TAB: 40 | 30 days supply | Qty: 30 | Fill #3

## 2019-04-08 MED FILL — LISINOPRIL-HCTZ 10-12.5 MG: 10-12.5 | 30 days supply | Qty: 30 | Fill #3

## 2019-04-29 MED FILL — PRAVASTATIN NA 40 MG TAB: 40 | 30 days supply | Qty: 30 | Fill #4

## 2019-05-10 MED FILL — LISINOPRIL-HCTZ 10-12.5 MG: 10-12.5 | 30 days supply | Qty: 30 | Fill #4

## 2019-05-30 MED FILL — PRAVASTATIN NA 40 MG TAB: 40 | 30 days supply | Qty: 30 | Fill #5

## 2019-06-09 MED FILL — LISINOPRIL-HCTZ 10-12.5 MG: 10-12.5 | 30 days supply | Qty: 30 | Fill #5

## 2019-06-15 ENCOUNTER — Ambulatory Visit: Payer: Medicare Other | Admitting: Family Medicine

## 2019-06-16 ENCOUNTER — Ambulatory Visit: Payer: Medicare Other | Attending: Family Medicine | Admitting: Family Medicine

## 2019-06-16 ENCOUNTER — Other Ambulatory Visit: Payer: Self-pay

## 2019-06-16 ENCOUNTER — Other Ambulatory Visit: Payer: Self-pay | Admitting: Family Medicine

## 2019-06-16 ENCOUNTER — Encounter: Payer: Self-pay | Admitting: Family Medicine

## 2019-06-16 VITALS — BP 125/77 | HR 84 | Ht 67.0 in | Wt 254.0 lb

## 2019-06-16 DIAGNOSIS — M25561 Pain in right knee: Secondary | ICD-10-CM | POA: Diagnosis not present

## 2019-06-16 DIAGNOSIS — I10 Essential (primary) hypertension: Secondary | ICD-10-CM | POA: Diagnosis not present

## 2019-06-16 DIAGNOSIS — G8929 Other chronic pain: Secondary | ICD-10-CM | POA: Diagnosis not present

## 2019-06-16 DIAGNOSIS — Z833 Family history of diabetes mellitus: Secondary | ICD-10-CM | POA: Insufficient documentation

## 2019-06-16 DIAGNOSIS — Z79899 Other long term (current) drug therapy: Secondary | ICD-10-CM | POA: Diagnosis not present

## 2019-06-16 DIAGNOSIS — E7800 Pure hypercholesterolemia, unspecified: Secondary | ICD-10-CM | POA: Insufficient documentation

## 2019-06-16 DIAGNOSIS — M62838 Other muscle spasm: Secondary | ICD-10-CM | POA: Diagnosis not present

## 2019-06-16 DIAGNOSIS — Z885 Allergy status to narcotic agent status: Secondary | ICD-10-CM | POA: Diagnosis not present

## 2019-06-16 DIAGNOSIS — Z8042 Family history of malignant neoplasm of prostate: Secondary | ICD-10-CM | POA: Insufficient documentation

## 2019-06-16 DIAGNOSIS — Z96643 Presence of artificial hip joint, bilateral: Secondary | ICD-10-CM | POA: Diagnosis not present

## 2019-06-16 DIAGNOSIS — Z87442 Personal history of urinary calculi: Secondary | ICD-10-CM | POA: Insufficient documentation

## 2019-06-16 DIAGNOSIS — E559 Vitamin D deficiency, unspecified: Secondary | ICD-10-CM | POA: Diagnosis not present

## 2019-06-16 DIAGNOSIS — Z8249 Family history of ischemic heart disease and other diseases of the circulatory system: Secondary | ICD-10-CM | POA: Diagnosis not present

## 2019-06-16 DIAGNOSIS — F17210 Nicotine dependence, cigarettes, uncomplicated: Secondary | ICD-10-CM | POA: Insufficient documentation

## 2019-06-16 DIAGNOSIS — Z803 Family history of malignant neoplasm of breast: Secondary | ICD-10-CM | POA: Diagnosis not present

## 2019-06-16 DIAGNOSIS — Z881 Allergy status to other antibiotic agents status: Secondary | ICD-10-CM | POA: Diagnosis not present

## 2019-06-16 DIAGNOSIS — Z886 Allergy status to analgesic agent status: Secondary | ICD-10-CM | POA: Diagnosis not present

## 2019-06-16 DIAGNOSIS — R5383 Other fatigue: Secondary | ICD-10-CM | POA: Insufficient documentation

## 2019-06-16 DIAGNOSIS — M47816 Spondylosis without myelopathy or radiculopathy, lumbar region: Secondary | ICD-10-CM

## 2019-06-16 MED ORDER — TRAMADOL HCL 50 MG PO TABS: 50.0000 mg | ORAL_TABLET | Freq: Two times a day (BID) | ORAL | 0 refills | Status: DC | PRN

## 2019-06-16 MED ORDER — CYCLOBENZAPRINE HCL 10 MG PO TABS: 10.0000 mg | ORAL_TABLET | Freq: Two times a day (BID) | ORAL | 2 refills | Status: DC | PRN

## 2019-06-16 MED ORDER — LISINOPRIL-HYDROCHLOROTHIAZIDE 10-12.5 MG PO TABS: 1.0000 | ORAL_TABLET | Freq: Every day | ORAL | 1 refills | Status: DC

## 2019-06-16 MED ORDER — PRAVASTATIN SODIUM 40 MG PO TABS: 40.0000 mg | ORAL_TABLET | Freq: Every day | ORAL | 1 refills | Status: DC

## 2019-06-16 NOTE — Progress Notes (Signed)
Subjective:  Patient ID: Kimberly Sosa, female    DOB: 02-Aug-1960  Age: 59 y.o. MRN: [id number]  CC: Hypertension   HPI Almarie Knya Endris is a 59 year old female with a history of hypertension, hypercholesterolemia, degenerative disease of the lumbar spine here for follow-up visit. States she has a fragment in her knee which has been moving. Her lower back hurts, it was worse yesterday; I buprofen provided some releif. She is out of Tramadol and her muscle relaxanrt. Pain is intermittent; she had declined PT referral at her last visit and still declines today as she states pain is not intermittent. Doing well on her antihypertensive and her statin. She also complains of intermittent fatigue even after having a good nights rest. She has cut down on her smoking down to 5 cigarettes/day.  Past Medical History:  Diagnosis Date  . Active smoker   . Back pain   . Carpal tunnel syndrome    LEFT HAND  . DJD (degenerative joint disease), lumbar   . Hypercholesterolemia   . Hypertension   . Kidney stone   . Pinched nerve in neck     Past Surgical History:  Procedure Laterality Date  . APPENDECTOMY    . ROTATOR CUFF REPAIR    . 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 TOTAL HIP ARTHROPLASTY Right 07/08/2016   Procedure: RIGHT TOTAL HIP ARTHROPLASTY ANTERIOR APPROACH;  Surgeon: Paralee Cancel, MD;  Location: [facility];  Service: Orthopedics;  Laterality: Right;    Family History  Problem Relation Age of Onset  . Diabetes Mother   . Hypertension Mother   . Heart failure Mother   . Prostate cancer Father   . Diabetes Father   . Diabetes Sister   . Diabetes Brother        3 brothers deceased - CHF  . Healthy Sister   . Breast cancer Maternal Grandmother   . Colon cancer Neg Hx   . Rectal cancer Neg Hx   .  Esophageal cancer Neg Hx   . Liver cancer Neg Hx     Allergies  Allergen Reactions  . Hydrocodone Other (See Comments)    Causes Headaches  . Metronidazole Itching  . Mobic [Meloxicam] Other (See Comments)    Causes Headaches  . Tetracyclines & Related Swelling    All over body swelling    Outpatient Medications Prior to Visit  Medication Sig Dispense Refill  . cetirizine (ZYRTEC) 10 MG tablet Take 1 tablet (10 mg total) by mouth daily. 30 tablet 11  . cyclobenzaprine (FLEXERIL) 10 MG tablet Take 1 tablet (10 mg total) by mouth 2 (two) times daily as needed for muscle spasms. 60 tablet 2  . lisinopril-hydrochlorothiazide (ZESTORETIC) 10-12.5 MG tablet Take 1 tablet by mouth daily. 90 tablet 1  . pravastatin (PRAVACHOL) 40 MG tablet Take 1 tablet (40 mg total) by mouth daily. 90 tablet 1  . traMADol (ULTRAM) 50 MG tablet Take 1 tablet (50 mg total) by mouth every 12 (twelve) hours as needed for severe pain. 60 tablet 0  . acetaminophen (TYLENOL) 500 MG tablet Take 2 tablets (1,000 mg total) by mouth every 6 (six) hours as needed for moderate pain. (Patient not taking: Reported on 07/14/2017) 30 tablet 0  . diphenhydrAMINE (BENADRYL ALLERGY) 25 MG tablet Take 1 tablet (25 mg total) by mouth every 6 (  six) hours as needed. (Patient not taking: Reported on 06/16/2019) 20 tablet 0  . ferrous sulfate (FERROUSUL) 325 (65 FE) MG tablet Take 1 tablet (325 mg total) by mouth 3 (three) times daily with meals. (Patient not taking: Reported on 06/08/2018) 90 tablet 2  . nicotine (NICODERM CQ) 7 mg/24hr patch Place 1 patch (7 mg total) onto the skin daily. (Patient not taking: Reported on 06/16/2019) 28 patch 1  . triamcinolone cream (KENALOG) 0.1 % Apply 1 application topically 2 (two) times daily. (Patient not taking: Reported on 04/13/2018) 30 g 0   Facility-Administered Medications Prior to Visit  Medication Dose Route Frequency Provider Last Rate Last Admin  . 0.9 %  sodium chloride infusion  500 mL  Intravenous Continuous Ladene Artist, MD         ROS Review of Systems  Constitutional: Positive for fatigue. Negative for activity change and appetite change.  HENT: Negative for congestion, sinus pressure and sore throat.   Eyes: Negative for visual disturbance.  Respiratory: Negative for cough, chest tightness, shortness of breath and wheezing.   Cardiovascular: Negative for chest pain and palpitations.  Gastrointestinal: Negative for abdominal distention, abdominal pain and constipation.  Endocrine: Negative for polydipsia.  Genitourinary: Negative for dysuria and frequency.  Musculoskeletal:       See HPI  Skin: Negative for rash.  Neurological: Negative for tremors, light-headedness and numbness.  Hematological: Does not bruise/bleed easily.  Psychiatric/Behavioral: Negative for agitation and behavioral problems.    Objective:  BP 125/77   Pulse 84   Ht 5\' 7"  (1.702 m)   Wt 254 lb (115.2 kg)   SpO2 100%   BMI 39.78 kg/m   BP/Weight 06/16/2019 12/20/2018 123456  Systolic BP 0000000 [id number]  Diastolic BP 77 72 79  Wt. (Lbs) 254 249 253.4  BMI 39.78 39 39.69      Physical Exam Constitutional:      Appearance: She is well-developed.  Neck:     Vascular: No JVD.  Cardiovascular:     Rate and Rhythm: Normal rate.     Heart sounds: Normal heart sounds. No murmur.  Pulmonary:     Effort: Pulmonary effort is normal.     Breath sounds: Normal breath sounds. No wheezing or rales.  Chest:     Chest wall: No tenderness.  Abdominal:     General: Bowel sounds are normal. There is no distension.     Palpations: Abdomen is soft. There is no mass.     Tenderness: There is no abdominal tenderness.  Musculoskeletal:        General: Normal range of motion.     Right lower leg: No edema.     Left lower leg: No edema.  Neurological:     Mental Status: She is alert and oriented to person, place, and time.  Psychiatric:        Mood and Affect: Mood normal.     CMP  Latest Ref Rng & Units 04/14/2018 06/03/2017 07/09/2016  Glucose 65 - 99 mg/dL 103(H) 104(H) 116(H)  BUN 6 - 24 mg/dL 13 17 19   Creatinine 0.57 - 1.00 mg/dL 1.11(H) 1.04(H) 0.80  Sodium 134 - 144 mmol/L 142 140 139  Potassium 3.5 - 5.2 mmol/L 4.1 3.9 3.6  Chloride 96 - 106 mmol/L 105 104 109  CO2 20 - 29 mmol/L 19(L) 22 24  Calcium 8.7 - 10.2 mg/dL 9.5 9.9 8.4(L)  Total Protein 6.0 - 8.5 g/dL 6.9 7.3 -  Total Bilirubin 0.0 -  1.2 mg/dL 0.3 0.2 -  Alkaline Phos 39 - 117 IU/L 127(H) 114 -  AST 0 - 40 IU/L 15 11 -  ALT 0 - 32 IU/L 10 10 -    Lipid Panel     Component Value Date/Time   CHOL 181 09/01/2018 1110   TRIG 72 09/01/2018 1110   HDL 61 09/01/2018 1110   CHOLHDL 3.0 09/01/2018 1110   LDLCALC 106 (H) 09/01/2018 1110    CBC    Component Value Date/Time   WBC 13.8 (H) 09/16/2018 1017   WBC 14.2 (H) 07/09/2016 0509   RBC 4.43 09/16/2018 1017   RBC 3.10 (L) 07/09/2016 0509   HGB 13.0 09/16/2018 1017   HCT 39.7 09/16/2018 1017   PLT 418 09/16/2018 1017   MCV 90 09/16/2018 1017   MCH 29.3 09/16/2018 1017   MCH 29.0 07/09/2016 0509   MCHC 32.7 09/16/2018 1017   MCHC 33.0 07/09/2016 0509   RDW 13.3 09/16/2018 1017   LYMPHSABS 5.3 (H) 09/16/2018 1017   EOSABS 0.2 09/16/2018 1017   BASOSABS 0.1 09/16/2018 1017    Lab Results  Component Value Date   HGBA1C 5.9 (H) 06/03/2017    Assessment & Plan:  1. Spondylosis of lumbar region without myelopathy or radiculopathy Stable with intermittent flares She declines PT at this time - cyclobenzaprine (FLEXERIL) 10 MG tablet; Take 1 tablet (10 mg total) by mouth 2 (two) times daily as needed for muscle spasms.  Dispense: 60 tablet; Refill: 2  2. Muscle spasm Controlled - cyclobenzaprine (FLEXERIL) 10 MG tablet; Take 1 tablet (10 mg total) by mouth 2 (two) times daily as needed for muscle spasms.  Dispense: 60 tablet; Refill: 2  3. Essential hypertension Controlled Counseled on blood pressure goal of less than 130/80,  low-sodium, DASH diet, medication compliance, 150 minutes of moderate intensity exercise per week. Discussed medication compliance, adverse effects. - lisinopril-hydrochlorothiazide (ZESTORETIC) 10-12.5 MG tablet; Take 1 tablet by mouth daily.  Dispense: 90 tablet; Refill: 1  4. Hypercholesterolemia Controlled Low-cholesterol diet - pravastatin (PRAVACHOL) 40 MG tablet; Take 1 tablet (40 mg total) by mouth daily.  Dispense: 90 tablet; Refill: 1  5. Chronic pain of right knee Intermittent pain Unable to use NSAIDs due to allergy Advised to obtain OTC Voltaren gel - traMADol (ULTRAM) 50 MG tablet; Take 1 tablet (50 mg total) by mouth every 12 (twelve) hours as needed for severe pain.  Dispense: 60 tablet; Refill: 0  6. Other fatigue Last hemoglobin was negative for anemia - VITAMIN D 25 Hydroxy (Vit-D Deficiency, Fractures)      Charlott Rakes, MD, FAAFP. [facility] and [place], [place]   06/16/2019, 9:44 AM

## 2019-06-16 NOTE — Progress Notes (Signed)
Having lower back and right knee pain.

## 2019-06-17 LAB — VITAMIN D 25 HYDROXY (VIT D DEFICIENCY, FRACTURES): Vit D, 25-Hydroxy: 10.6 ng/mL — ABNORMAL LOW (ref 30.0–100.0)

## 2019-06-19 ENCOUNTER — Other Ambulatory Visit: Payer: Self-pay | Admitting: Family Medicine

## 2019-06-19 MED ORDER — ERGOCALCIFEROL 1.25 MG (50000 UT) PO CAPS: 50000.0000 [IU] | ORAL_CAPSULE | ORAL | 1 refills | Status: DC

## 2019-06-20 ENCOUNTER — Telehealth: Payer: Self-pay

## 2019-06-20 MED FILL — VIT D2 1.25 MG (50,000 UNIT: 1.25 MG | 84 days supply | Qty: 12 | Fill #0

## 2019-06-20 NOTE — Telephone Encounter (Signed)
Patient name and DOB has been verified Patient was informed of lab results. Patient had no questions.  

## 2019-06-20 NOTE — Telephone Encounter (Signed)
-----   Message from Charlott Rakes, MD sent at 06/19/2019  3:17 PM EDT ----- Vitamin D is low and this could explain her fatigue. I have sent a prescription for replacement to her Pharmacy which will be taken for 12 weeks and after that she will need a repeat level and continue Vit D OTC 1000units daily

## 2019-06-27 MED FILL — PRAVASTATIN NA 40 MG TAB: 40 | 30 days supply | Qty: 30 | Fill #0

## 2019-07-25 MED FILL — PRAVASTATIN NA 40 MG TAB: 40 | 30 days supply | Qty: 30 | Fill #1

## 2019-08-09 DIAGNOSIS — H524 Presbyopia: Secondary | ICD-10-CM | POA: Diagnosis not present

## 2019-08-09 DIAGNOSIS — Z0100 Encounter for examination of eyes and vision without abnormal findings: Secondary | ICD-10-CM | POA: Diagnosis not present

## 2019-08-09 MED FILL — LISINOPRIL-HCTZ 10-12.5 MG: 10-12.5 | 30 days supply | Qty: 30 | Fill #1

## 2019-08-23 ENCOUNTER — Other Ambulatory Visit: Payer: Self-pay

## 2019-08-23 ENCOUNTER — Ambulatory Visit: Payer: Medicare HMO | Attending: Family Medicine | Admitting: Family Medicine

## 2019-08-23 ENCOUNTER — Encounter: Payer: Self-pay | Admitting: Family Medicine

## 2019-08-23 ENCOUNTER — Other Ambulatory Visit: Payer: Self-pay | Admitting: Family Medicine

## 2019-08-23 VITALS — BP 129/76 | HR 94 | Ht 67.0 in | Wt 241.2 lb

## 2019-08-23 DIAGNOSIS — M25552 Pain in left hip: Secondary | ICD-10-CM | POA: Diagnosis not present

## 2019-08-23 DIAGNOSIS — M7541 Impingement syndrome of right shoulder: Secondary | ICD-10-CM | POA: Diagnosis not present

## 2019-08-23 DIAGNOSIS — E559 Vitamin D deficiency, unspecified: Secondary | ICD-10-CM

## 2019-08-23 MED ORDER — IBUPROFEN 600 MG PO TABS: 600.0000 mg | ORAL_TABLET | Freq: Three times a day (TID) | ORAL | 1 refills | Status: DC | PRN

## 2019-08-23 MED FILL — IBUPROFEN 600 MG TABLET: 600 | 20 days supply | Qty: 60 | Fill #0

## 2019-08-23 MED FILL — PRAVASTATIN NA 40 MG TAB: 40 | 30 days supply | Qty: 30 | Fill #2

## 2019-08-23 NOTE — Progress Notes (Signed)
Having pain in left inner thigh and left shoulder.

## 2019-08-23 NOTE — Progress Notes (Signed)
Subjective:  Patient ID: Kimberly Sosa, female    DOB: May 04, 1960  Age: 59 y.o. MRN: [id number]  CC: Pain   HPI Kimberly Sosa  is a 59 year old female with a history ofhypertension, hypercholesterolemia, degenerative disease of the lumbar spine here for an acute visit.  She has R shoulder pain worse when she lies on it.  Previous h/o rotator cuff surgery. Pain was an 8/10 and is a 2/10 right now. L groin feels like a pinched nerve worse on sitting and is relieved with walking. Symptoms started 3-4 weeks ago She plans to commence [employer] with [employer]  Past Medical History:  Diagnosis Date  . Active smoker   . Back pain   . Carpal tunnel syndrome    LEFT HAND  . DJD (degenerative joint disease), lumbar   . Hypercholesterolemia   . Hypertension   . Kidney stone   . Pinched nerve in neck     Past Surgical History:  Procedure Laterality Date  . APPENDECTOMY    . ROTATOR CUFF REPAIR    . 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 TOTAL HIP ARTHROPLASTY Right 07/08/2016   Procedure: RIGHT TOTAL HIP ARTHROPLASTY ANTERIOR APPROACH;  Surgeon: Paralee Cancel, MD;  Location: [facility];  Service: Orthopedics;  Laterality: Right;    Family History  Problem Relation Age of Onset  . Diabetes Mother   . Hypertension Mother   . Heart failure Mother   . Prostate cancer Father   . Diabetes Father   . Diabetes Sister   . Diabetes Brother        3 brothers deceased - CHF  . Healthy Sister   . Breast cancer Maternal Grandmother   . Colon cancer Neg Hx   . Rectal cancer Neg Hx   . Esophageal cancer Neg Hx   . Liver cancer Neg Hx     Allergies  Allergen Reactions  . Hydrocodone Other (See Comments)    Causes Headaches  . Metronidazole Itching  . Mobic [Meloxicam] Other (See Comments)    Causes  Headaches  . Tetracyclines & Related Swelling    All over body swelling    Outpatient Medications Prior to Visit  Medication Sig Dispense Refill  . cetirizine (ZYRTEC) 10 MG tablet Take 1 tablet (10 mg total) by mouth daily. 30 tablet 11  . cyclobenzaprine (FLEXERIL) 10 MG tablet Take 1 tablet (10 mg total) by mouth 2 (two) times daily as needed for muscle spasms. 60 tablet 2  . ergocalciferol (DRISDOL) 1.25 MG (50000 UT) capsule Take 1 capsule (50,000 Units total) by mouth once a week. 12 capsule 1  . lisinopril-hydrochlorothiazide (ZESTORETIC) 10-12.5 MG tablet Take 1 tablet by mouth daily. 90 tablet 1  . pravastatin (PRAVACHOL) 40 MG tablet Take 1 tablet (40 mg total) by mouth daily. 90 tablet 1  . traMADol (ULTRAM) 50 MG tablet Take 1 tablet (50 mg total) by mouth every 12 (twelve) hours as needed for severe pain. 60 tablet 0  . acetaminophen (TYLENOL) 500 MG tablet Take 2 tablets (1,000 mg total) by mouth every 6 (six) hours as needed for moderate pain. (Patient not taking: Reported on 07/14/2017) 30 tablet 0  . diphenhydrAMINE (BENADRYL ALLERGY) 25 MG tablet Take 1 tablet (25 mg total) by mouth every 6 (six) hours as needed. (Patient not taking: Reported  on 06/16/2019) 20 tablet 0  . ferrous sulfate (FERROUSUL) 325 (65 FE) MG tablet Take 1 tablet (325 mg total) by mouth 3 (three) times daily with meals. (Patient not taking: Reported on 06/08/2018) 90 tablet 2  . nicotine (NICODERM CQ) 7 mg/24hr patch Place 1 patch (7 mg total) onto the skin daily. (Patient not taking: Reported on 06/16/2019) 28 patch 1  . triamcinolone cream (KENALOG) 0.1 % Apply 1 application topically 2 (two) times daily. (Patient not taking: Reported on 04/13/2018) 30 g 0   Facility-Administered Medications Prior to Visit  Medication Dose Route Frequency Provider Last Rate Last Admin  . 0.9 %  sodium chloride infusion  500 mL Intravenous Continuous Ladene Artist, MD         ROS Review of Systems  Constitutional:  Negative for activity change, appetite change and fatigue.  HENT: Negative for congestion, sinus pressure and sore throat.   Eyes: Negative for visual disturbance.  Respiratory: Negative for cough, chest tightness, shortness of breath and wheezing.   Cardiovascular: Negative for chest pain and palpitations.  Gastrointestinal: Negative for abdominal distention, abdominal pain and constipation.  Endocrine: Negative for polydipsia.  Genitourinary: Negative for dysuria and frequency.  Musculoskeletal:       See HPI  Skin: Negative for rash.  Neurological: Negative for tremors, light-headedness and numbness.  Hematological: Does not bruise/bleed easily.  Psychiatric/Behavioral: Negative for agitation and behavioral problems.    Objective:  BP 129/76   Pulse 94   Ht 5' 7" (1.702 m)   Wt 241 lb 3.2 oz (109.4 kg)   SpO2 99%   BMI 37.78 kg/m   BP/Weight 08/23/2019 06/16/2019 78/58/8502  Systolic BP 774 128 786  Diastolic BP 76 77 72  Wt. (Lbs) 241.2 254 249  BMI 37.78 39.78 39      Physical Exam Constitutional:      Appearance: She is well-developed.  Neck:     Vascular: No JVD.  Cardiovascular:     Rate and Rhythm: Normal rate.     Heart sounds: Normal heart sounds. No murmur heard.   Pulmonary:     Effort: Pulmonary effort is normal.     Breath sounds: Normal breath sounds. No wheezing or rales.  Chest:     Chest wall: No tenderness.  Abdominal:     General: Bowel sounds are normal. There is no distension.     Palpations: Abdomen is soft. There is no mass.     Tenderness: There is no abdominal tenderness.  Musculoskeletal:        General: Normal range of motion.     Right lower leg: No edema.     Left lower leg: No edema.     Comments: Normal appearance of both shoulders, no normal range of motion of both shoulders Negative Hawkins and Neer signs bilaterally Slight tenderness to palpation of medial aspect of left hip joint and on flexion of left hip  Neurological:      Mental Status: She is alert and oriented to person, place, and time.  Psychiatric:        Mood and Affect: Mood normal.     CMP Latest Ref Rng & Units 04/14/2018 06/03/2017 07/09/2016  Glucose 65 - 99 mg/dL 103(H) 104(H) 116(H)  BUN 6 - 24 mg/dL _0 Creatinine 0.57 - 1.00 mg/dL 1.11(H) 1.04(H) 0.80  Sodium 134 - 144 mmol/L 142 140 139  Potassium 3.5 - 5.2 mmol/L 4.1 3.9 3.6  Chloride 96 - 106 mmol/L 105  104 109  CO2 20 - 29 mmol/L 19(L) 22 24  Calcium 8.7 - 10.2 mg/dL 9.5 9.9 8.4(L)  Total Protein 6.0 - 8.5 g/dL 6.9 7.3 -  Total Bilirubin 0.0 - 1.2 mg/dL 0.3 0.2 -  Alkaline Phos 39 - 117 IU/L 127(H) 114 -  AST 0 - 40 IU/L 15 11 -  ALT 0 - 32 IU/L 10 10 -    Lipid Panel     Component Value Date/Time   CHOL 181 09/01/2018 1110   TRIG 72 09/01/2018 1110   HDL 61 09/01/2018 1110   CHOLHDL 3.0 09/01/2018 1110   LDLCALC 106 (H) 09/01/2018 1110    CBC    Component Value Date/Time   WBC 13.8 (H) 09/16/2018 1017   WBC 14.2 (H) 07/09/2016 0509   RBC 4.43 09/16/2018 1017   RBC 3.10 (L) 07/09/2016 0509   HGB 13.0 09/16/2018 1017   HCT 39.7 09/16/2018 1017   PLT 418 09/16/2018 1017   MCV 90 09/16/2018 1017   MCH 29.3 09/16/2018 1017   MCH 29.0 07/09/2016 0509   MCHC 32.7 09/16/2018 1017   MCHC 33.0 07/09/2016 0509   RDW 13.3 09/16/2018 1017   LYMPHSABS 5.3 (H) 09/16/2018 1017   EOSABS 0.2 09/16/2018 1017   BASOSABS 0.1 09/16/2018 1017    Lab Results  Component Value Date   HGBA1C 5.9 (H) 06/03/2017    Assessment & Plan:  1. Impingement syndrome of right shoulder Uncontrolled with intermittent exacerbations - Ambulatory referral to Physical Therapy - ibuprofen (ADVIL) 600 MG tablet; Take 1 tablet (600 mg total) by mouth every 8 (eight) hours as needed.  Dispense: 60 tablet; Refill: 1 - CMP14+EGFR  2. Left hip pain Uncontrolled She will benefit from PT and weight loss - Ambulatory referral to Physical Therapy - ibuprofen (ADVIL) 600 MG tablet; Take 1 tablet  (600 mg total) by mouth every 8 (eight) hours as needed.  Dispense: 60 tablet; Refill: 1  3. Vitamin D deficiency Previous history of vitamin D deficiency We will repeat level - VITAMIN D 25 Hydroxy (Vit-D Deficiency, Fractures)    Return for Medical conditions, keep previously scheduled appointment.    Charlott Rakes, MD, FAAFP. [facility] and [place], [place]   08/23/2019, 10:34 AM

## 2019-08-23 NOTE — Patient Instructions (Signed)
Shoulder Pain Many things can cause shoulder pain, including:  An injury.  Moving the shoulder in the same way again and again (overuse).  Joint pain (arthritis). Pain can come from:  Swelling and irritation (inflammation) of any part of the shoulder.  An injury to the shoulder joint.  An injury to: ? Tissues that connect muscle to bone (tendons). ? Tissues that connect bones to each other (ligaments). ? Bones. Follow these instructions at home: Watch for changes in your symptoms. Let your doctor know about them. Follow these instructions to help with your pain. If you have a sling:  Wear the sling as told by your doctor. Remove it only as told by your doctor.  Loosen the sling if your fingers: ? Tingle. ? Become numb. ? Turn cold and blue.  Keep the sling clean.  If the sling is not waterproof: ? Do not let it get wet. ? Take the sling off when you shower or bathe. Managing pain, stiffness, and swelling   If told, put ice on the painful area: ? Put ice in a plastic bag. ? Place a towel between your skin and the bag. ? Leave the ice on for 20 minutes, 2-3 times a day. Stop putting ice on if it does not help with the pain.  Squeeze a soft ball or a foam pad as much as possible. This prevents swelling in the shoulder. It also helps to strengthen the arm. General instructions  Take over-the-counter and prescription medicines only as told by your doctor.  Keep all follow-up visits as told by your doctor. This is important. Contact a doctor if:  Your pain gets worse.  Medicine does not help your pain.  You have new pain in your arm, hand, or fingers. Get help right away if:  Your arm, hand, or fingers: ? Tingle. ? Are numb. ? Are swollen. ? Are painful. ? Turn white or blue. Summary  Shoulder pain can be caused by many things. These include injury, moving the shoulder in the same away again and again, and joint pain.  Watch for changes in your symptoms.  Let your doctor know about them.  This condition may be treated with a sling, ice, and pain medicine.  Contact your doctor if the pain gets worse or you have new pain. Get help right away if your arm, hand, or fingers tingle or get numb, swollen, or painful.  Keep all follow-up visits as told by your doctor. This is important. This information is not intended to replace advice given to you by your health care provider. Make sure you discuss any questions you have with your health care provider. Document Revised: 08/04/2017 Document Reviewed: 08/04/2017 Elsevier Patient Education  2020 [employer]  

## 2019-08-24 LAB — CMP14+EGFR
ALT: 8 IU/L (ref 0–32)
AST: 14 IU/L (ref 0–40)
Albumin/Globulin Ratio: 1.8 (ref 1.2–2.2)
Albumin: 4.4 g/dL (ref 3.8–4.9)
Alkaline Phosphatase: 117 IU/L (ref 48–121)
BUN/Creatinine Ratio: 19 (ref 9–23)
BUN: 19 mg/dL (ref 6–24)
Bilirubin Total: <0.2 mg/dL (ref 0.0–1.2)
CO2: 21 mmol/L (ref 20–29)
Calcium: 10 mg/dL (ref 8.7–10.2)
Chloride: 105 mmol/L (ref 96–106)
Creatinine, Ser: 1.02 mg/dL — ABNORMAL HIGH (ref 0.57–1.00)
GFR calc Af Amer: 70 mL/min/{1.73_m2} (ref >59)
GFR calc non Af Amer: 60 mL/min/{1.73_m2} (ref >59)
Globulin, Total: 2.5 g/dL (ref 1.5–4.5)
Glucose: 83 mg/dL (ref 65–99)
Potassium: 3.9 mmol/L (ref 3.5–5.2)
Sodium: 144 mmol/L (ref 134–144)
Total Protein: 6.9 g/dL (ref 6.0–8.5)

## 2019-08-24 LAB — VITAMIN D 25 HYDROXY (VIT D DEFICIENCY, FRACTURES): Vit D, 25-Hydroxy: 39.8 ng/mL (ref 30.0–100.0)

## 2019-08-25 ENCOUNTER — Telehealth: Payer: Self-pay

## 2019-08-25 NOTE — Telephone Encounter (Signed)
Patient voicemail is full

## 2019-08-25 NOTE — Telephone Encounter (Signed)
-----   Message from Charlott Rakes, MD sent at 08/24/2019 12:00 PM EDT ----- Labs are normal

## 2019-09-07 MED FILL — LISINOPRIL-HCTZ 10-12.5 MG: 10-12.5 | 30 days supply | Qty: 30 | Fill #2

## 2019-09-26 MED FILL — PRAVASTATIN NA 40 MG TAB: 40 | 30 days supply | Qty: 30 | Fill #3

## 2019-10-06 MED FILL — LISINOPRIL-HCTZ 10-12.5 MG: 10-12.5 | 30 days supply | Qty: 30 | Fill #3

## 2019-10-25 MED FILL — PRAVASTATIN NA 40 MG TAB: 40 | 30 days supply | Qty: 30 | Fill #4

## 2019-11-07 MED FILL — LISINOPRIL-HCTZ 10-12.5 MG: 10-12.5 | 30 days supply | Qty: 30 | Fill #4

## 2019-11-22 MED FILL — PRAVASTATIN NA 40 MG TAB: 40 | 30 days supply | Qty: 30 | Fill #5

## 2019-12-08 MED FILL — LISINOPRIL-HCTZ 10-12.5 MG: 10-12.5 | 30 days supply | Qty: 30 | Fill #5

## 2019-12-19 ENCOUNTER — Encounter: Payer: Self-pay | Admitting: Family Medicine

## 2019-12-19 ENCOUNTER — Other Ambulatory Visit: Payer: Self-pay | Admitting: Family Medicine

## 2019-12-19 ENCOUNTER — Other Ambulatory Visit: Payer: Self-pay

## 2019-12-19 ENCOUNTER — Ambulatory Visit: Payer: Medicare HMO | Attending: Family Medicine | Admitting: Family Medicine

## 2019-12-19 VITALS — BP 120/77 | HR 80 | Temp 98.5°F | Ht 67.0 in | Wt 239.2 lb

## 2019-12-19 DIAGNOSIS — R229 Localized swelling, mass and lump, unspecified: Secondary | ICD-10-CM | POA: Diagnosis not present

## 2019-12-19 DIAGNOSIS — I10 Essential (primary) hypertension: Secondary | ICD-10-CM | POA: Diagnosis not present

## 2019-12-19 DIAGNOSIS — E7800 Pure hypercholesterolemia, unspecified: Secondary | ICD-10-CM | POA: Diagnosis not present

## 2019-12-19 DIAGNOSIS — M62838 Other muscle spasm: Secondary | ICD-10-CM | POA: Diagnosis not present

## 2019-12-19 MED ORDER — PRAVASTATIN SODIUM 40 MG PO TABS: 40.0000 mg | ORAL_TABLET | Freq: Every day | ORAL | 1 refills | Status: DC

## 2019-12-19 MED ORDER — LISINOPRIL-HYDROCHLOROTHIAZIDE 10-12.5 MG PO TABS: 1.0000 | ORAL_TABLET | Freq: Every day | ORAL | 1 refills | Status: DC

## 2019-12-19 MED FILL — PRAVASTATIN NA 40 MG TAB: 40 | 90 days supply | Qty: 90 | Fill #0

## 2019-12-19 NOTE — Progress Notes (Signed)
States that she has a knot on he left arm.  Getting muscle spasms in her ribcage.

## 2019-12-19 NOTE — Patient Instructions (Signed)
Muscle Cramps and Spasms Muscle cramps and spasms are when muscles tighten by themselves. They usually get better within minutes. Muscle cramps are painful. They are usually stronger and last longer than muscle spasms. Muscle spasms may or may not be painful. They can last a few seconds or much longer. Cramps and spasms can affect any muscle, but they occur most often in the calf muscles of the leg. They are usually not caused by a serious problem. In many cases, the cause is not known. Some common causes include:  Doing more physical work or exercise than your body is ready for.  Using the muscles too much (overuse) by repeating certain movements too many times.  Staying in a certain position for a long time.  Playing a sport or doing an activity without preparing properly.  Using bad form or technique while playing a sport or doing an activity.  Not having enough water in your body (dehydration).  Injury.  Side effects of some medicines.  Low levels of the salts and minerals in your blood (electrolytes), such as low potassium or calcium. Follow these instructions at home: Managing pain and stiffness      Massage, stretch, and relax the muscle. Do this for many minutes at a time.  If told, put heat on tight or tense muscles as often as told by your doctor. Use the heat source that your doctor recommends, such as a moist heat pack or a heating pad. ? Place a towel between your skin and the heat source. ? Leave the heat on for 20-30 minutes. ? Remove the heat if your skin turns bright red. This is very important if you are not able to feel pain, heat, or cold. You may have a greater risk of getting burned.  If told, put ice on the affected area. This may help if you are sore or have pain after a cramp or spasm. ? Put ice in a plastic bag. ? Place a towel between your skin and the bag. ? Leave the ice on for 20 minutes, 2-3 times a day.  Try taking hot showers or baths to help  relax tight muscles. Eating and drinking  Drink enough fluid to keep your pee (urine) pale yellow.  Eat a healthy diet to help ensure that your muscles work well. This should include: ? Fruits and vegetables. ? Lean protein. ? Whole grains. ? Low-fat or nonfat dairy products. General instructions  If you are having cramps often, avoid intense exercise for several days.  Take over-the-counter and prescription medicines only as told by your doctor.  Watch for any changes in your symptoms.  Keep all follow-up visits as told by your doctor. This is important. Contact a doctor if:  Your cramps or spasms get worse or happen more often.  Your cramps or spasms do not get better with time. Summary  Muscle cramps and spasms are when muscles tighten by themselves. They usually get better within minutes.  Cramps and spasms occur most often in the calf muscles of the leg.  Massage, stretch, and relax the muscle. This may help the cramp or spasm go away.  Drink enough fluid to keep your pee (urine) pale yellow. This information is not intended to replace advice given to you by your health care provider. Make sure you discuss any questions you have with your health care provider. Document Revised: 06/15/2017 Document Reviewed: 06/15/2017 Elsevier Patient Education  2020 [employer] Inc.  

## 2019-12-19 NOTE — Progress Notes (Signed)
Subjective:  Patient ID: Kimberly Sosa, female    DOB: May 18, 1960  Age: 59 y.o. MRN: [id number]  CC: Hypertension   HPI Kimberly Sosa is a 59 year old female with a history ofhypertension, hypercholesterolemia, degenerative disease of the lumbar spine here for chronic disease management. She has been exercising and cutting out red meat, eating healthy, avoiding fried foods and has lost 15 pounds in the last 6 months. She endorses compliance with her antihypertensive and her statin with no adverse effects from her medications.  Today her blood pressure is normal and she is yet to take her antihypertensive.  She endorses intermittent muscle spasms on the right side of her abdomen which she is triggered by sudden movement but symptoms are absent at this time. She also noticed a tiny mobile lesion on the extensor aspect of her left forearm which is not painful and she is unable to tell its duration  Past Medical History:  Diagnosis Date  . Active smoker   . Back pain   . Carpal tunnel syndrome    LEFT HAND  . DJD (degenerative joint disease), lumbar   . Hypercholesterolemia   . Hypertension   . Kidney stone   . Pinched nerve in neck     Past Surgical History:  Procedure Laterality Date  . APPENDECTOMY    . ROTATOR CUFF REPAIR    . 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 TOTAL HIP ARTHROPLASTY Right 07/08/2016   Procedure: RIGHT TOTAL HIP ARTHROPLASTY ANTERIOR APPROACH;  Surgeon: Paralee Cancel, MD;  Location: [facility];  Service: Orthopedics;  Laterality: Right;    Family History  Problem Relation Age of Onset  . Diabetes Mother   . Hypertension Mother   . Heart failure Mother   . Prostate cancer Father   . Diabetes Father   . Diabetes Sister   . Diabetes Brother        3 brothers deceased - CHF  .  Healthy Sister   . Breast cancer Maternal Grandmother   . Colon cancer Neg Hx   . Rectal cancer Neg Hx   . Esophageal cancer Neg Hx   . Liver cancer Neg Hx     Allergies  Allergen Reactions  . Hydrocodone Other (See Comments)    Causes Headaches  . Metronidazole Itching  . Mobic [Meloxicam] Other (See Comments)    Causes Headaches  . Tetracyclines & Related Swelling    All over body swelling    Outpatient Medications Prior to Visit  Medication Sig Dispense Refill  . cetirizine (ZYRTEC) 10 MG tablet Take 1 tablet (10 mg total) by mouth daily. 30 tablet 11  . cyclobenzaprine (FLEXERIL) 10 MG tablet Take 1 tablet (10 mg total) by mouth 2 (two) times daily as needed for muscle spasms. 60 tablet 2  . ergocalciferol (DRISDOL) 1.25 MG (50000 UT) capsule Take 1 capsule (50,000 Units total) by mouth once a week. 12 capsule 1  . ibuprofen (ADVIL) 600 MG tablet Take 1 tablet (600 mg total) by mouth every 8 (eight) hours as needed. 60 tablet 1  . traMADol (ULTRAM) 50 MG tablet Take 1 tablet (50 mg total) by mouth every 12 (twelve) hours as needed for severe pain. 60 tablet 0  . lisinopril-hydrochlorothiazide (ZESTORETIC) 10-12.5 MG tablet Take 1 tablet by mouth daily. 90 tablet 1  . pravastatin (  PRAVACHOL) 40 MG tablet Take 1 tablet (40 mg total) by mouth daily. 90 tablet 1  . acetaminophen (TYLENOL) 500 MG tablet Take 2 tablets (1,000 mg total) by mouth every 6 (six) hours as needed for moderate pain. (Patient not taking: Reported on 07/14/2017) 30 tablet 0  . diphenhydrAMINE (BENADRYL ALLERGY) 25 MG tablet Take 1 tablet (25 mg total) by mouth every 6 (six) hours as needed. (Patient not taking: Reported on 06/16/2019) 20 tablet 0  . ferrous sulfate (FERROUSUL) 325 (65 FE) MG tablet Take 1 tablet (325 mg total) by mouth 3 (three) times daily with meals. (Patient not taking: Reported on 06/08/2018) 90 tablet 2  . nicotine (NICODERM CQ) 7 mg/24hr patch Place 1 patch (7 mg total) onto the skin daily.  (Patient not taking: Reported on 06/16/2019) 28 patch 1  . triamcinolone cream (KENALOG) 0.1 % Apply 1 application topically 2 (two) times daily. (Patient not taking: Reported on 04/13/2018) 30 g 0   Facility-Administered Medications Prior to Visit  Medication Dose Route Frequency Provider Last Rate Last Admin  . 0.9 %  sodium chloride infusion  500 mL Intravenous Continuous Ladene Artist, MD         ROS Review of Systems  Constitutional: Negative for activity change, appetite change and fatigue.  HENT: Negative for congestion, sinus pressure and sore throat.   Eyes: Negative for visual disturbance.  Respiratory: Negative for cough, chest tightness, shortness of breath and wheezing.   Cardiovascular: Negative for chest pain and palpitations.  Gastrointestinal: Negative for abdominal distention, abdominal pain and constipation.  Endocrine: Negative for polydipsia.  Genitourinary: Negative for dysuria and frequency.  Musculoskeletal: Negative for arthralgias and back pain.  Skin: Negative for rash.  Neurological: Negative for tremors, light-headedness and numbness.  Hematological: Does not bruise/bleed easily.  Psychiatric/Behavioral: Negative for agitation and behavioral problems.    Objective:  BP 120/77   Pulse 80   Temp 98.5 F (36.9 C) (Oral)   Ht 5' 7" (1.702 m)   Wt 239 lb 3.2 oz (108.5 kg)   SpO2 99%   BMI 37.46 kg/m   BP/Weight 12/19/2019 08/23/2019 3/66/4403  Systolic BP 474 259 563  Diastolic BP 77 76 77  Wt. (Lbs) 239.2 241.2 254  BMI 37.46 37.78 39.78      Physical Exam Constitutional:      Appearance: She is well-developed.  Neck:     Vascular: No JVD.  Cardiovascular:     Rate and Rhythm: Normal rate.     Heart sounds: Normal heart sounds. No murmur heard.   Pulmonary:     Effort: Pulmonary effort is normal.     Breath sounds: Normal breath sounds. No wheezing or rales.  Chest:     Chest wall: No tenderness.  Abdominal:     General: Bowel  sounds are normal. There is no distension.     Palpations: Abdomen is soft. There is no mass.     Tenderness: There is no abdominal tenderness.  Musculoskeletal:        General: Normal range of motion.     Right lower leg: No edema.     Left lower leg: No edema.  Skin:    Comments: Minute subcutaneous nodule in extensor aspect left forearm  Neurological:     Mental Status: She is alert and oriented to person, place, and time.  Psychiatric:        Mood and Affect: Mood normal.     CMP Latest Ref Rng & Units 08/23/2019  04/14/2018 06/03/2017  Glucose 65 - 99 mg/dL 83 103(H) 104(H)  BUN 6 - 24 mg/dL _0 Creatinine 0.57 - 1.00 mg/dL 1.02(H) 1.11(H) 1.04(H)  Sodium 134 - 144 mmol/L 144 142 140  Potassium 3.5 - 5.2 mmol/L 3.9 4.1 3.9  Chloride 96 - 106 mmol/L 105 105 104  CO2 20 - 29 mmol/L 21 19(L) 22  Calcium 8.7 - 10.2 mg/dL 10.0 9.5 9.9  Total Protein 6.0 - 8.5 g/dL 6.9 6.9 7.3  Total Bilirubin 0.0 - 1.2 mg/dL <0.2 0.3 0.2  Alkaline Phos 48 - 121 IU/L 117 127(H) 114  AST 0 - 40 IU/L _1 ALT 0 - 32 IU/L _2 Lipid Panel     Component Value Date/Time   CHOL 181 09/01/2018 1110   TRIG 72 09/01/2018 1110   HDL 61 09/01/2018 1110   CHOLHDL 3.0 09/01/2018 1110   LDLCALC 106 (H) 09/01/2018 1110    CBC    Component Value Date/Time   WBC 13.8 (H) 09/16/2018 1017   WBC 14.2 (H) 07/09/2016 0509   RBC 4.43 09/16/2018 1017   RBC 3.10 (L) 07/09/2016 0509   HGB 13.0 09/16/2018 1017   HCT 39.7 09/16/2018 1017   PLT 418 09/16/2018 1017   MCV 90 09/16/2018 1017   MCH 29.3 09/16/2018 1017   MCH 29.0 07/09/2016 0509   MCHC 32.7 09/16/2018 1017   MCHC 33.0 07/09/2016 0509   RDW 13.3 09/16/2018 1017   LYMPHSABS 5.3 (H) 09/16/2018 1017   EOSABS 0.2 09/16/2018 1017   BASOSABS 0.1 09/16/2018 1017    Lab Results  Component Value Date   HGBA1C 5.9 (H) 06/03/2017    Assessment & Plan:  1. Hypercholesterolemia Controlled Low-cholesterol diet - pravastatin  (PRAVACHOL) 40 MG tablet; Take 1 tablet (40 mg total) by mouth daily.  Dispense: 90 tablet; Refill: 1 - Lipid panel - CMP14+EGFR  2. Essential hypertension Controlled without antihypertensive I am concerned that she could be hypotensive while on her antihypertensive.  Discussed the possible regimen change of switching her to hydrochlorothiazide but will need to place her on potassium supplements however she declines this change.  Also declines discontinuation of HCTZ and placement of low-dose lisinopril as she states she needs a diuretic for pedal edema. Advised to keep an ambulatory blood pressure log and inform the clinic in the event that she becomes lightheaded - lisinopril-hydrochlorothiazide (ZESTORETIC) 10-12.5 MG tablet; Take 1 tablet by mouth daily.  Dispense: 90 tablet; Refill: 1  3. Muscle spasm Absent at this time Advised to change positions slowly Stay hydrated  4. Subcutaneous nodule Tiny cyst versus lipoma Patient reassured    Meds ordered this encounter  Medications  . pravastatin (PRAVACHOL) 40 MG tablet    Sig: Take 1 tablet (40 mg total) by mouth daily.    Dispense:  90 tablet    Refill:  1    Please dispense 90 supply  . lisinopril-hydrochlorothiazide (ZESTORETIC) 10-12.5 MG tablet    Sig: Take 1 tablet by mouth daily.    Dispense:  90 tablet    Refill:  1    Please dispense 90 supply    Follow-up: Return in about 6 months (around 06/17/2020) for chronic disease management.       Charlott Rakes, MD, FAAFP. [facility] [place], [place]   12/19/2019, 1:07 PM

## 2019-12-20 LAB — CMP14+EGFR
ALT: 11 IU/L (ref 0–32)
AST: 15 IU/L (ref 0–40)
Albumin/Globulin Ratio: 1.7 (ref 1.2–2.2)
Albumin: 4.4 g/dL (ref 3.8–4.9)
Alkaline Phosphatase: 123 IU/L — ABNORMAL HIGH (ref 44–121)
BUN/Creatinine Ratio: 18 (ref 9–23)
BUN: 16 mg/dL (ref 6–24)
Bilirubin Total: <0.2 mg/dL (ref 0.0–1.2)
CO2: 20 mmol/L (ref 20–29)
Calcium: 9.9 mg/dL (ref 8.7–10.2)
Chloride: 105 mmol/L (ref 96–106)
Creatinine, Ser: 0.91 mg/dL (ref 0.57–1.00)
GFR calc Af Amer: 80 mL/min/{1.73_m2} (ref >59)
GFR calc non Af Amer: 69 mL/min/{1.73_m2} (ref >59)
Globulin, Total: 2.6 g/dL (ref 1.5–4.5)
Glucose: 94 mg/dL (ref 65–99)
Potassium: 4.1 mmol/L (ref 3.5–5.2)
Sodium: 140 mmol/L (ref 134–144)
Total Protein: 7 g/dL (ref 6.0–8.5)

## 2019-12-20 LAB — LIPID PANEL
Chol/HDL Ratio: 3.3 ratio (ref 0.0–4.4)
Cholesterol, Total: 189 mg/dL (ref 100–199)
HDL: 58 mg/dL (ref >39)
LDL Chol Calc (NIH): 115 mg/dL — ABNORMAL HIGH (ref 0–99)
Triglycerides: 86 mg/dL (ref 0–149)
VLDL Cholesterol Cal: 16 mg/dL (ref 5–40)

## 2019-12-20 MED FILL — CYCLOBENZAPRINE 10 MG TAB: 10 | 30 days supply | Qty: 60 | Fill #1

## 2019-12-20 MED FILL — IBUPROFEN 600 MG TABLET: 600 | 20 days supply | Qty: 60 | Fill #1

## 2019-12-23 ENCOUNTER — Telehealth: Payer: Self-pay

## 2019-12-23 NOTE — Telephone Encounter (Signed)
Patient name and DOB has been verified Patient was informed of lab results. Patient had no questions.  

## 2019-12-23 NOTE — Telephone Encounter (Signed)
-----   Message from Charlott Rakes, MD sent at 12/20/2019  8:47 AM EST ----- Please inform the patient that labs are normal. Thank you.

## 2020-01-05 MED FILL — LISINOPRIL-HCTZ 10-12.5 MG: 10-12.5 | 90 days supply | Qty: 90 | Fill #0

## 2020-01-06 ENCOUNTER — Other Ambulatory Visit: Payer: Self-pay | Admitting: Family Medicine

## 2020-01-06 DIAGNOSIS — Z720 Tobacco use: Secondary | ICD-10-CM

## 2020-01-06 NOTE — Telephone Encounter (Signed)
Requested medication (s) are due for refill today:  Yes  Requested medication (s) are on the active medication list:  Yes  Future visit scheduled:  Yes  Last Refill: 12/20/18; #28; RF x 1  Notes to clinic: Rx expired.  Please advise.   Requested Prescriptions  Pending Prescriptions Disp Refills   nicotine (NICODERM CQ - DOSED IN MG/24 HR) 7 mg/24hr patch [Pharmacy Med Name: NICOTINE 7 MG/24HR PATCH 7 Patch] 28 patch 1    Sig: Place 1 patch (7 mg total) onto the skin daily.      Psychiatry:  Drug Dependence Therapy Passed - 01/06/2020  3:50 PM      Passed - Valid encounter within last 12 months    Recent Outpatient Visits           2 weeks ago Muscle spasm   [place], Enobong, MD   4 months ago Impingement syndrome of right shoulder   [place], Enobong, MD   6 months ago Other fatigue   [place], Enobong, MD   1 year ago Spondylosis of lumbar region without myelopathy or radiculopathy   [place], Enobong, MD   1 year ago Cold intolerance   [place], Enobong, MD       Future Appointments             In 5 months Charlott Rakes, MD [place]

## 2020-03-14 ENCOUNTER — Encounter: Payer: Self-pay | Admitting: Gastroenterology

## 2020-03-20 ENCOUNTER — Encounter: Payer: Self-pay | Admitting: Gastroenterology

## 2020-03-27 MED FILL — PRAVASTATIN NA 40 MG TAB: 40 | 90 days supply | Qty: 90 | Fill #1

## 2020-03-30 ENCOUNTER — Other Ambulatory Visit: Payer: Self-pay | Admitting: Family Medicine

## 2020-03-30 ENCOUNTER — Telehealth: Payer: Self-pay | Admitting: Family Medicine

## 2020-03-30 DIAGNOSIS — G8929 Other chronic pain: Secondary | ICD-10-CM

## 2020-03-30 NOTE — Telephone Encounter (Signed)
Request has already been sent to the office and waiting for approval

## 2020-03-30 NOTE — Telephone Encounter (Signed)
Medication: traMADol (ULTRAM) 50 MG tablet  Has the pt contacted their pharmacy? Yes and they transferred her to the office.  Preferred pharmacy:   [facility], [address]  Please be advised refills may take up to 3 business days.  We ask that you follow up with your pharmacy.  Pt states she has just been hurting a little more lately, her knees and legs ache. That is why she needs. Hopes to get today.

## 2020-03-30 NOTE — Telephone Encounter (Signed)
Requested medication (s) are due for refill today: yes  Requested medication (s) are on the active medication list: yes  Last refill:  06/16/19  Future visit scheduled: yes  Notes to clinic:  not delegated   Requested Prescriptions  Pending Prescriptions Disp Refills   traMADol (ULTRAM) 50 MG tablet [Pharmacy Med Name: traMADol HCL 50 MG TABS 50 Tablet] 60 tablet 0    Sig: Take 1 tablet (50 mg total) by mouth every 12 (twelve) hours as needed for severe pain.      Not Delegated - Analgesics:  Opioid Agonists Failed - 03/30/2020  9:53 AM      Failed - This refill cannot be delegated      Failed - Urine Drug Screen completed in last 360 days      Passed - Valid encounter within last 6 months    Recent Outpatient Visits           3 months ago Muscle spasm   [place], Charlane Ferretti, MD   7 months ago Impingement syndrome of right shoulder   [place], Enobong, MD   9 months ago Other fatigue   [place], Enobong, MD   1 year ago Spondylosis of lumbar region without myelopathy or radiculopathy   [place], Charlane Ferretti, MD   1 year ago Cold intolerance   [place], Enobong, MD       Future Appointments             In 2 months Charlott Rakes, MD [place]

## 2020-04-02 MED FILL — traMADol HCL 50 MG TABS: 50 | 7 days supply | Qty: 14 | Fill #0

## 2020-04-05 MED FILL — LISINOPRIL-HCTZ 10-12.5 MG: 10-12.5 | 90 days supply | Qty: 90 | Fill #1

## 2020-05-11 ENCOUNTER — Ambulatory Visit (AMBULATORY_SURGERY_CENTER): Payer: Self-pay | Admitting: *Deleted

## 2020-05-11 ENCOUNTER — Other Ambulatory Visit: Payer: Self-pay

## 2020-05-11 VITALS — Ht 67.0 in | Wt 239.0 lb

## 2020-05-11 DIAGNOSIS — Z86010 Personal history of colonic polyps: Secondary | ICD-10-CM

## 2020-05-11 MED ORDER — SUPREP BOWEL PREP KIT 17.5-3.13-1.6 GM/177ML PO SOLN
1.0000 | Freq: Once | ORAL | 0 refills | Status: AC
  Filled 2020-05-11 – 2020-05-22 (×2): qty 354, 1d supply, fill #0

## 2020-05-11 NOTE — Progress Notes (Signed)

## 2020-05-14 ENCOUNTER — Other Ambulatory Visit: Payer: Self-pay

## 2020-05-16 ENCOUNTER — Encounter: Payer: Self-pay | Admitting: Gastroenterology

## 2020-05-21 ENCOUNTER — Other Ambulatory Visit: Payer: Self-pay

## 2020-05-22 ENCOUNTER — Other Ambulatory Visit: Payer: Self-pay

## 2020-05-22 ENCOUNTER — Ambulatory Visit: Payer: Self-pay | Admitting: *Deleted

## 2020-05-22 NOTE — Telephone Encounter (Signed)
Summary: advice   Pt stated her foot hurts really bad, wanted to go to urgent care but the wait was over 3 hours, painful knot on her foot, stated cant put on a shoe. Pt stated she does not know what to do, has future appt on 4/27 with Dr Margarita Rana, but needs advice in the meantime.      Call to patient- patient states she is not sure what is happening in her foot- but the top of her L foot has a painful knot that is making it difficult for her to walk. She does have some swelling in her foot as well and some shin pain. Patient states the pain has been present for 1 1/2 weeks and is not getting better. Advised no appointment available within disposition- call to office and the mobile unit is in [facility] tomorrow. Advised patient of location and hours. Patient advised seek care sooner if symptoms get worse over the evening. Patient states she understands. Reason for Disposition . [1] Swollen foot AND [2] no fever  (Exceptions: localized bump from bunions, calluses, insect bite, sting)  Answer Assessment - Initial Assessment Questions 1. ONSET: "When did the pain start?"      1 week 2. LOCATION: "Where is the pain located?"      L foot- top of foot 3. PAIN: "How bad is the pain?"    (Scale 1-10; or mild, moderate, severe)  - MILD (1-3): doesn't interfere with normal activities.   - MODERATE (4-7): interferes with normal activities (e.g., work or school) or awakens from sleep, limping.   - SEVERE (8-10): excruciating pain, unable to do any normal activities, unable to walk.      moderate 4. WORK OR EXERCISE: "Has there been any recent work or exercise that involved this part of the body?"      no 5. CAUSE: "What do you think is causing the foot pain?"     Not sure 6. OTHER SYMPTOMS: "Do you have any other symptoms?" (e.g., leg pain, rash, fever, numbness)     Front of calf- pain with walking 7. PREGNANCY: "Is there any chance you are pregnant?" "When was your last menstrual period?"      n/a  Protocols used: FOOT PAIN-A-AH

## 2020-05-23 ENCOUNTER — Other Ambulatory Visit: Payer: Self-pay

## 2020-05-23 ENCOUNTER — Ambulatory Visit: Payer: Medicare HMO | Admitting: Critical Care Medicine

## 2020-05-23 ENCOUNTER — Ambulatory Visit
Admission: RE | Admit: 2020-05-23 | Discharge: 2020-05-23 | Disposition: A | Discharge status: Home / Self Care | Payer: Medicare HMO | Source: Ambulatory Visit | Attending: Critical Care Medicine | Admitting: Critical Care Medicine

## 2020-05-23 VITALS — BP 128/85 | HR 72 | Temp 98.2°F | Resp 18 | Ht 67.5 in | Wt 241.0 lb

## 2020-05-23 DIAGNOSIS — M79672 Pain in left foot: Secondary | ICD-10-CM | POA: Diagnosis not present

## 2020-05-23 DIAGNOSIS — M19072 Primary osteoarthritis, left ankle and foot: Secondary | ICD-10-CM | POA: Diagnosis not present

## 2020-05-23 DIAGNOSIS — M25552 Pain in left hip: Secondary | ICD-10-CM | POA: Diagnosis not present

## 2020-05-23 DIAGNOSIS — M25572 Pain in left ankle and joints of left foot: Secondary | ICD-10-CM

## 2020-05-23 DIAGNOSIS — M7541 Impingement syndrome of right shoulder: Secondary | ICD-10-CM

## 2020-05-23 DIAGNOSIS — E119 Type 2 diabetes mellitus without complications: Secondary | ICD-10-CM | POA: Diagnosis not present

## 2020-05-23 DIAGNOSIS — M7989 Other specified soft tissue disorders: Secondary | ICD-10-CM | POA: Diagnosis not present

## 2020-05-23 MED ORDER — DICLOFENAC SODIUM 1 % EX GEL
2.0000 g | Freq: Four times a day (QID) | CUTANEOUS | 0 refills | Status: DC
  Filled 2020-05-23: qty 100, 12d supply, fill #0

## 2020-05-23 MED ORDER — IBUPROFEN 600 MG PO TABS
ORAL_TABLET | Freq: Three times a day (TID) | ORAL | 1 refills | Status: DC | PRN
  Filled 2020-05-23: qty 60, 20d supply, fill #0

## 2020-05-23 NOTE — Telephone Encounter (Signed)
Pt was seen on mobile unit today.

## 2020-05-23 NOTE — Assessment & Plan Note (Signed)
Acute left ankle pain likely from a sprain there is swelling anterior to the ankle and the dorsal aspect of the left foot  Plan is to obtain images of the ankle and complete images of the foot itself  Patient received ibuprofen 600 mg 3 times daily as needed for pain and also topical diclofenac  She was given advice to apply ice topically  We will get her back in follow-up pending results of imaging studies and may yet refer to [facility] given she is insured

## 2020-05-23 NOTE — Progress Notes (Signed)
Subjective:    Patient ID: Kimberly Sosa, female    DOB: 10/29/1960, 60 y.o.   MRN: [id number]  This is a pleasant 60 year old female primary care patient of Dr. Margarita Rana comes to the mobile medicine unit because of left foot pain.  She was wearing crocs and had a toe insert between her great toe and second toe of the left foot and she twisted and turned her ankle and now is having pain in the ankle and in the top of the foot.  This occurred about 2 days ago and she came to the mobile medicine unit because the urgent care sites were quite busy and would take a 2-hour wait  Patient has no other complaints at this time and her chart is completely reviewed for prior conditions Patient has an upcoming appointment with orthopedic surgeon as she has had bilateral hip replacements in the past  History of hypertension but on arrival blood pressure 128/85.     Past Medical History:  Diagnosis Date  . Active smoker   . Allergy   . Anemia    past hx   . Back pain   . Carpal tunnel syndrome    LEFT HAND  . DJD (degenerative joint disease), lumbar   . Hypercholesterolemia   . Hypertension   . Kidney stone   . Pinched nerve in neck      Family History  Problem Relation Age of Onset  . Diabetes Mother   . Hypertension Mother   . Heart failure Mother   . Prostate cancer Father   . Diabetes Father   . Diabetes Sister   . Diabetes Brother        3 brothers deceased - CHF  . Healthy Sister   . Breast cancer Maternal Grandmother   . Colon cancer Neg Hx   . Rectal cancer Neg Hx   . Esophageal cancer Neg Hx   . Liver cancer Neg Hx   . Colon polyps Neg Hx      Social History   Socioeconomic History  . Marital status: Single    Spouse name: Not on file  . Number of children: 2  . Years of education: Not on file  . Highest education level: Not on file  Occupational History  . Occupation: disabled  Tobacco Use  . Smoking status: Current Every Day Smoker    Packs/day: 0.30     Types: Cigarettes    Last attempt to quit: 07/05/2016    Years since quitting: 3.8  . Smokeless tobacco: Never Used  . Tobacco comment: 1 pack every 3 days  Substance and Sexual Activity  . Alcohol use: Yes    Comment: occ   . Drug use: Yes    Frequency: 3.0 times per week    Types: Marijuana    Comment: occasionally  . Sexual activity: Yes    Birth control/protection: Surgical  Other Topics Concern  . Not on file  Social History Narrative  . Not on file   Social Determinants of Health   Financial Resource Strain: Not on file  Food Insecurity: Not on file  Transportation Needs: Not on file  Physical Activity: Not on file  Stress: Not on file  Social Connections: Not on file  Intimate Partner Violence: Not on file     Allergies  Allergen Reactions  . Citrus Itching    Peaches  Oranges   . Hydrocodone Other (See Comments)    Causes Headaches  . Metronidazole Itching  .  Mobic [Meloxicam] Other (See Comments)    Causes Headaches  . Tetracyclines & Related Swelling    All over body swelling     Outpatient Medications Prior to Visit  Medication Sig Dispense Refill  . Cholecalciferol (VITAMIN D3 PO) Take by mouth.    . cyclobenzaprine (FLEXERIL) 10 MG tablet TAKE 1 TABLET (10 MG TOTAL) BY MOUTH 2 (TWO) TIMES DAILY AS NEEDED FOR MUSCLE SPASMS. 60 tablet 2  . diphenhydrAMINE (BENADRYL ALLERGY) 25 MG tablet Take 1 tablet (25 mg total) by mouth every 6 (six) hours as needed. 20 tablet 0  . lisinopril-hydrochlorothiazide (ZESTORETIC) 10-12.5 MG tablet TAKE 1 TABLET BY MOUTH DAILY. 90 tablet 1  . loratadine (CLARITIN) 10 MG tablet Take 10 mg by mouth daily.    . Na Sulfate-K Sulfate-Mg Sulf (SUPREP BOWEL PREP KIT) 17.5-3.13-1.6 GM/177ML SOLN Take 1 kit by mouth once for 1 dose. suprep as directed. No substitutions 354 mL 0  . pravastatin (PRAVACHOL) 40 MG tablet TAKE 1 TABLET (40 MG TOTAL) BY MOUTH DAILY. 90 tablet 1  . traMADol (ULTRAM) 50 MG tablet TAKE 1 TABLET (50 MG TOTAL)  BY MOUTH EVERY 12 (TWELVE) HOURS AS NEEDED FOR SEVERE PAIN. 60 tablet 0  . triamcinolone cream (KENALOG) 0.1 % Apply 1 application topically 2 (two) times daily. 30 g 0  . ibuprofen (ADVIL) 600 MG tablet TAKE 1 TABLET (600 MG TOTAL) BY MOUTH EVERY 8 (EIGHT) HOURS AS NEEDED. 60 tablet 1  . Ibuprofen 200 MG CAPS Take by mouth.    . nicotine (NICODERM CQ - DOSED IN MG/24 HR) 7 mg/24hr patch PLACE 1 PATCH (7 MG TOTAL) ONTO THE SKIN DAILY. (Patient not taking: No sig reported) 28 patch 1  . 0.9 %  sodium chloride infusion      No facility-administered medications prior to visit.      Review of Systems  HENT: Negative.   Respiratory: Negative.   Genitourinary: Negative.   Musculoskeletal:       Left foot pain Low back pain  Neurological: Negative.   Psychiatric/Behavioral: Negative.        Objective:   Physical Exam  Vitals:   05/23/20 1150  BP: 128/85  Pulse: 72  Resp: 18  Temp: 98.2 F (36.8 C)  TempSrc: Oral  SpO2: 96%  Weight: 241 lb (109.3 kg)  Height: 5' 7.5" (1.715 m)    Gen: Pleasant,obese, in no distress,  normal affect  ENT: No lesions,  mouth clear,  oropharynx clear, no postnasal drip  Neck: No JVD, no TMG, no carotid bruits  Lungs: No use of accessory muscles, no dullness to percussion, clear without rales or rhonchi  Cardiovascular: RRR, heart sounds normal, no murmur or gallops, no peripheral edema  Abdomen: soft and NT, no HSM,  BS normal  Musculoskeletal: The left foot shows swelling at the dorsal aspect near the ankle the ankle has full range of motion is not tender there is no evidence of fracture or injury to any of the toes on the left foot the plantar aspect of the foot is nontender no cyanosis or clubbing  Neuro: alert, non focal  Skin: Warm, no lesions or rashes     Assessment & Plan:  I personally reviewed all images and lab data in the [facility] system as well as any outside material available during this office visit and agree with the   radiology impressions.   Acute left ankle pain Acute left ankle pain likely from a sprain there is swelling anterior to the  ankle and the dorsal aspect of the left foot  Plan is to obtain images of the ankle and complete images of the foot itself  Patient received ibuprofen 600 mg 3 times daily as needed for pain and also topical diclofenac  She was given advice to apply ice topically  We will get her back in follow-up pending results of imaging studies and may yet refer to [facility] given she is insured   Sumer was seen today for foot swelling.  Diagnoses and all orders for this visit:  Left foot pain -     DG Foot Complete Left; Future  Impingement syndrome of right shoulder -     ibuprofen (ADVIL) 600 MG tablet; TAKE 1 TABLET (600 MG TOTAL) BY MOUTH EVERY 8 (EIGHT) HOURS AS NEEDED.  Left hip pain -     ibuprofen (ADVIL) 600 MG tablet; TAKE 1 TABLET (600 MG TOTAL) BY MOUTH EVERY 8 (EIGHT) HOURS AS NEEDED.  Acute left ankle pain -     DG Ankle Complete Left; Future  Other orders -     diclofenac Sodium (VOLTAREN) 1 % GEL; Apply 2 g topically 4 (four) times daily.

## 2020-05-23 NOTE — Patient Instructions (Signed)
Begin ibuprofen 600 mg 3 times daily as needed for left foot and ankle pain  Begin Voltaren gel applied topically 2-3 times daily to the swelling of the left foot  Obtain x-rays at the [facility] of your left foot and ankle this is been ordered you do not need an appointment walk up are welcome  Keep upcoming appointment with Dr.Newlin   Depending on x-ray results we may refer you to [facility] for further evaluation  Please continue to ice the foot as you can

## 2020-05-23 NOTE — Progress Notes (Signed)
Patient verified DOB Patient complains of left foot pain beginning a week ago. Pain is located on the top of her foot. 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 Pain is scaled at a 9. Patient has taken chronic medication and patient has not eaten today.

## 2020-05-24 ENCOUNTER — Other Ambulatory Visit: Payer: Self-pay | Admitting: Critical Care Medicine

## 2020-05-24 DIAGNOSIS — M79672 Pain in left foot: Secondary | ICD-10-CM

## 2020-05-25 ENCOUNTER — Ambulatory Visit (AMBULATORY_SURGERY_CENTER): Payer: Medicare HMO | Admitting: Gastroenterology

## 2020-05-25 ENCOUNTER — Encounter: Payer: Self-pay | Admitting: Gastroenterology

## 2020-05-25 ENCOUNTER — Other Ambulatory Visit: Payer: Self-pay

## 2020-05-25 VITALS — BP 119/83 | HR 75 | Temp 97.3°F | Resp 20 | Ht 67.0 in | Wt 239.0 lb

## 2020-05-25 DIAGNOSIS — D124 Benign neoplasm of descending colon: Secondary | ICD-10-CM | POA: Diagnosis not present

## 2020-05-25 DIAGNOSIS — Z86010 Personal history of colonic polyps: Secondary | ICD-10-CM | POA: Diagnosis not present

## 2020-05-25 IMAGING — US ULTRASOUND RIGHT BREAST LIMITED
1 series · 12 of 24 positions shown · non-contrast
Comparison: Baseline screening mammogram 06/15/2018

CLINICAL DATA: Screening recall from new baseline for possible
masses and a group of calcifications in the right breast.

EXAM:
DIGITAL DIAGNOSTIC RIGHT MAMMOGRAM WITH CAD AND TOMO
ULTRASOUND RIGHT BREAST

[Series 1: ultrasound right breast limited · 0.07mm/px · 12 of 24 slices shown]
[im 2/24]
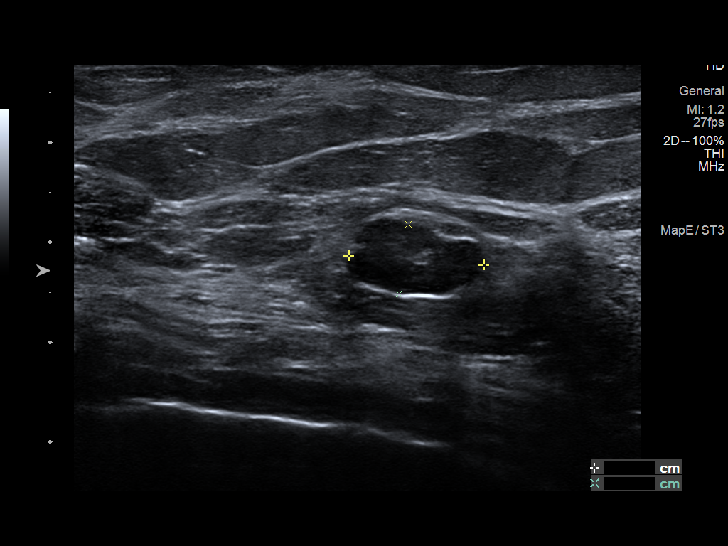
[im 4/24]
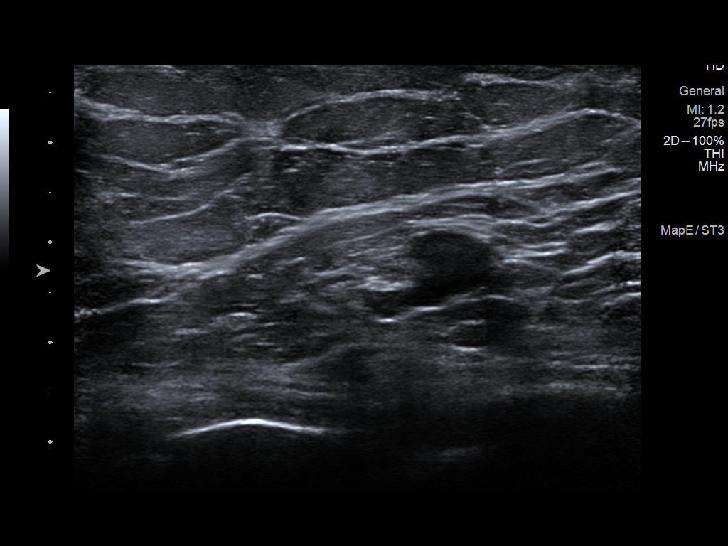
[im 6/24]
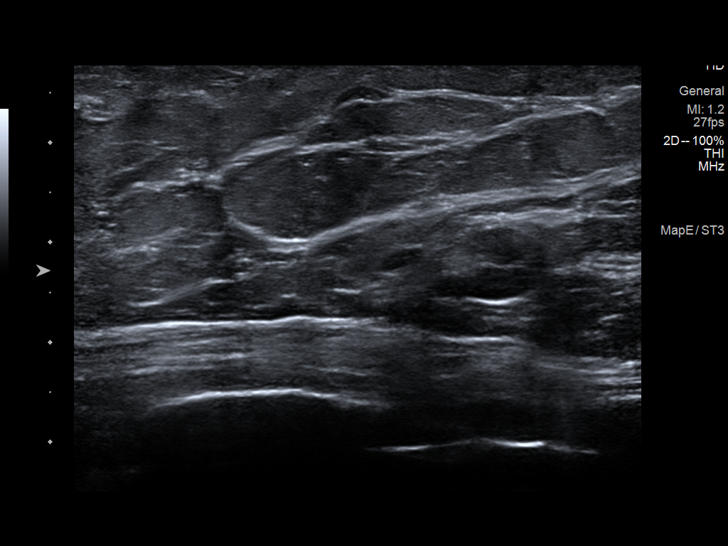
[im 8/24]
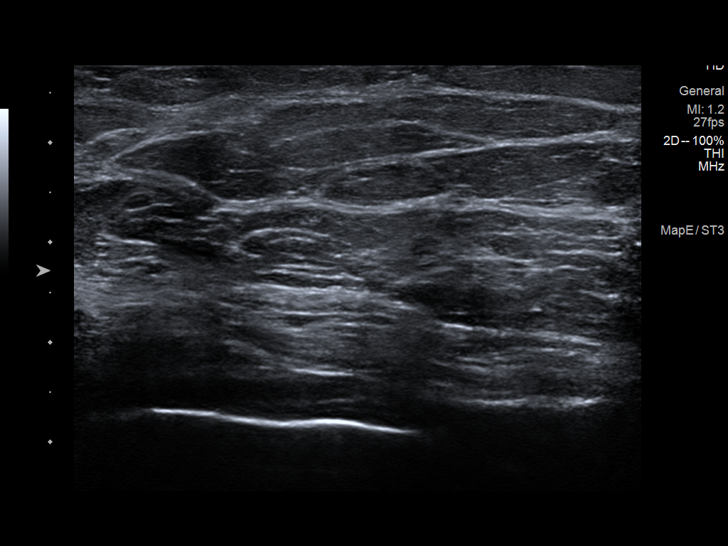
[im 10/24]
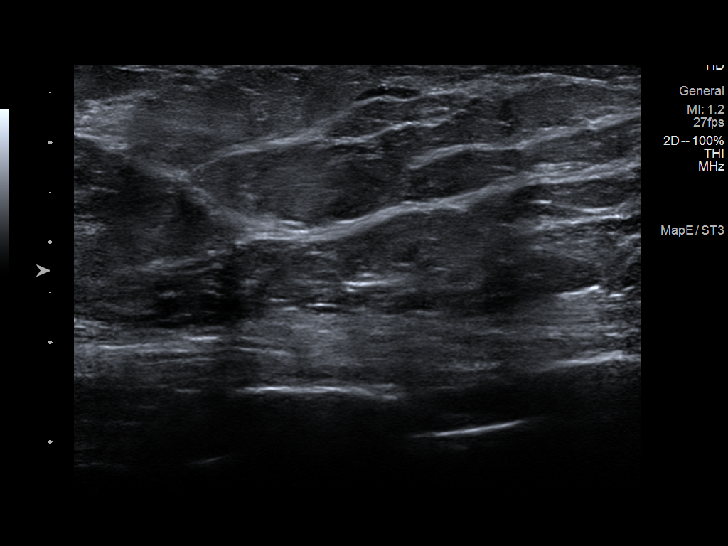
[im 12/24]
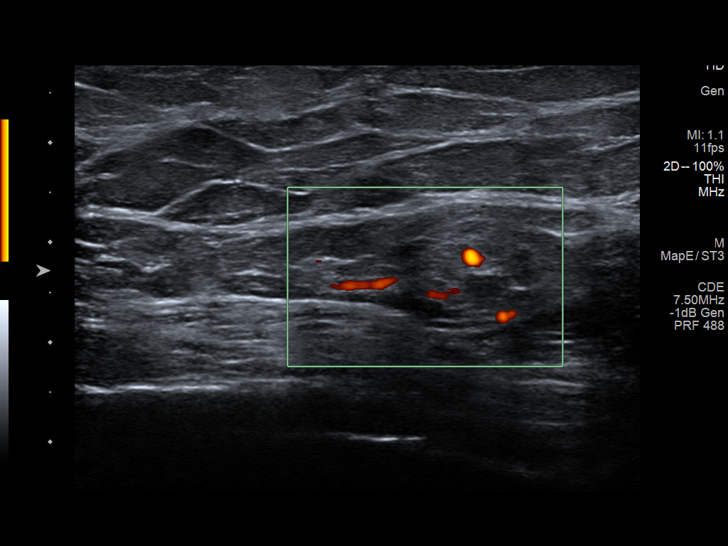
[im 14/24]
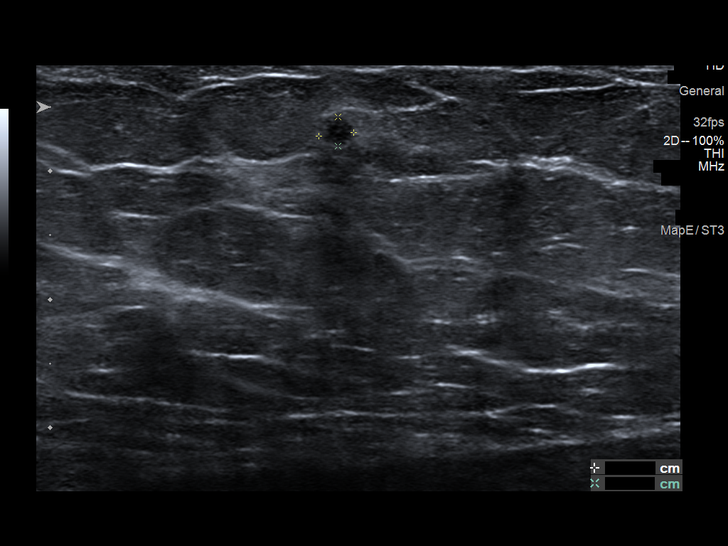
[im 16/24]
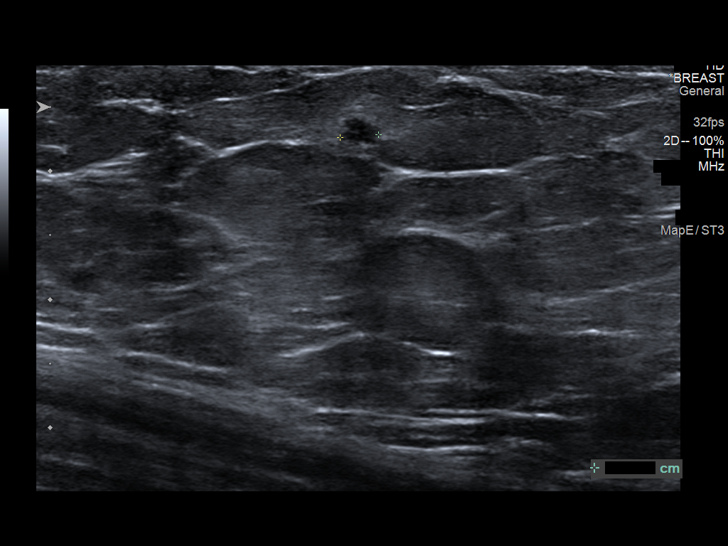
[im 18/24]
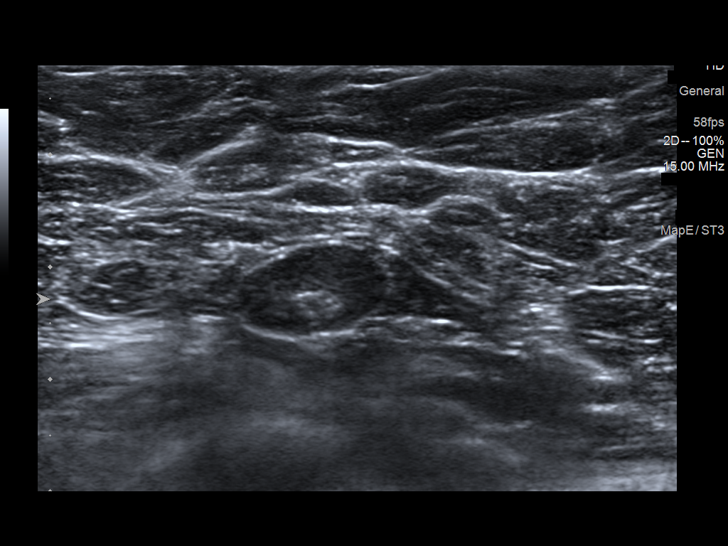
[im 20/24]
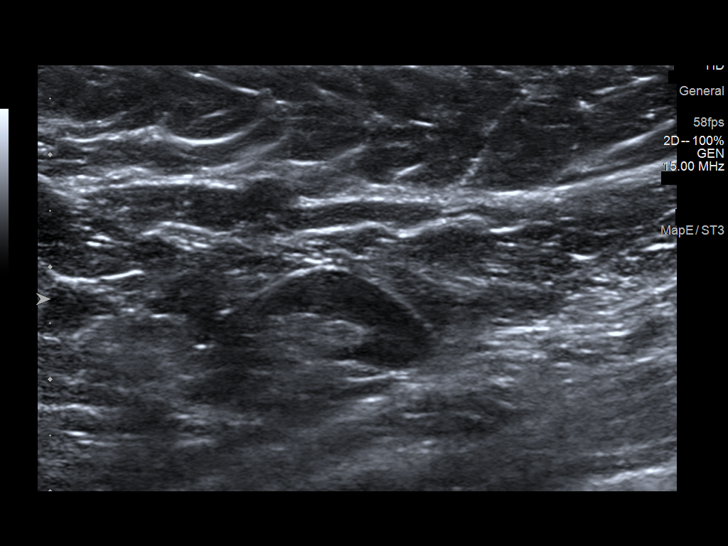
[im 22/24]
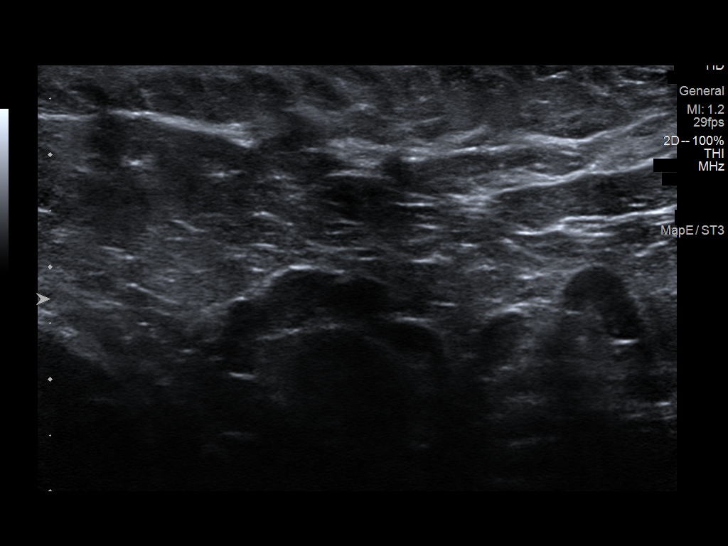
[im 24/24]
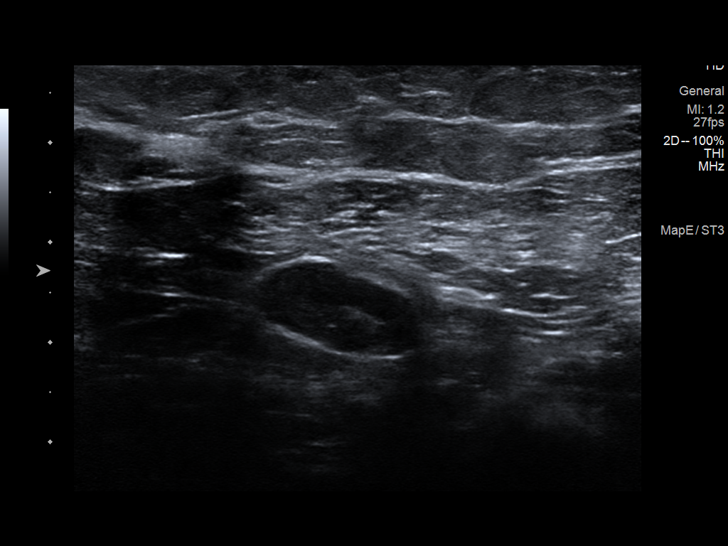

[12 of 24 positions shown; findings below may reference images not displayed]

ACR Breast Density Category b: There are scattered areas of
fibroglandular density.
FINDINGS: In the medial middle depth of the right breast there is a tightly
clustered group of coarse heterogeneous calcifications spanning 4
mm. In the far posterior slightly lateral right breast there is a
circumscribed oval mass measuring 1.5 cm. There is a possible lucent
notch suggesting that this may be a lymph node. There is a small
adjacent circumscribed oval mass measuring 4 mm. In the upper-outer
quadrant of the right breast, posterior depth, there is a
circumscribed low-density oval mass measuring 5 mm, which is
possibly fat containing.

Mammographic images were processed with CAD.

Ultrasound of the right breast at 9 o'clock, 9 cm from the nipple
demonstrates a mass with a fatty notch consistent with an
intramammary lymph node. The cortex of this lymph node measures up
to 4 mm.

At the same location just adjacent to this lymph node is a small
hypoechoic mass measuring 3 x 2 x 3 mm. This may also represent an
intramammary lymph node.

In the right breast at 10 o'clock, 10 cm from the nipple there is a
superficial hypoechoic mass with slight surrounding echogenicity
measuring 3 x 2 x 3 mm. This is suggestive of fat necrosis no blood
flow seen within the mass on color Doppler imaging.

Ultrasound of the right axilla demonstrates multiple prominent lymph
nodes with cortices measuring up to 5 mm. For comparison purposes
the left axilla was also scanned demonstrating multiple lymph nodes
with a similar appearance.
IMPRESSION: 1. There is an indeterminate intramammary lymph node in the right
breast at 9 o'clock with a thickened cortex. There is bilateral
axillary lymphadenopathy, with lymph nodes similar in appearance to
this intramammary lymph node at 9 o'clock.

2. There is a 4 mm group of indeterminate calcifications in the
medial right breast.

3. There are 2 small probably benign masses in the right breast. One
is immediately adjacent to the lymph node at 9 o'clock and may
represent a lymph node. The other is in the upper-outer quadrant at
10 o'clock, and may represent fat necrosis.

RECOMMENDATION:
1. Ultrasound-guided biopsy is recommended for the intramammary
lymph node at 9 o'clock.

2. Stereotactic biopsy is recommended for the calcifications in the
medial right breast.

3. If the above biopsies are benign, six-month follow-up ultrasound
is recommended for the 2 probably benign right breast masses. If
surgical excision is necessary for either of the biopsies,
ultrasound-guided biopsy is recommended for the 2 probably benign
masses in the right breast.

These procedures have been scheduled for 07/02/2018 at [DATE] p.m.

I have discussed the findings and recommendations with the patient.
Results were also provided in writing at the conclusion of the
visit. If applicable, a reminder letter will be sent to the patient
regarding the next appointment.

BI-RADS CATEGORY  4: Suspicious.

## 2020-05-25 MED ORDER — SODIUM CHLORIDE 0.9 % IV SOLN: 500.0000 mL | Freq: Once | INTRAVENOUS | Status: DC

## 2020-05-25 NOTE — Patient Instructions (Signed)
Discharge instructions given. Handouts on polyps,diverticulosis and hemorrhoids. Resume previous medications. YOU HAD AN ENDOSCOPIC PROCEDURE TODAY AT [facility]:   Refer to the procedure report that was given to you for any specific questions about what was found during the examination.  If the procedure report does not answer your questions, please call your gastroenterologist to clarify.  If you requested that your care partner not be given the details of your procedure findings, then the procedure report has been included in a sealed envelope for you to review at your convenience later.  YOU SHOULD EXPECT: Some feelings of bloating in the abdomen. Passage of more gas than usual.  Walking can help get rid of the air that was put into your GI tract during the procedure and reduce the bloating. If you had a lower endoscopy (such as a colonoscopy or flexible sigmoidoscopy) you may notice spotting of blood in your stool or on the toilet paper. If you underwent a bowel prep for your procedure, you may not have a normal bowel movement for a few days.  Please Note:  You might notice some irritation and congestion in your nose or some drainage.  This is from the oxygen used during your procedure.  There is no need for concern and it should clear up in a day or so.  SYMPTOMS TO REPORT IMMEDIATELY:   Following lower endoscopy (colonoscopy or flexible sigmoidoscopy):  Excessive amounts of blood in the stool  Significant tenderness or worsening of abdominal pains  Swelling of the abdomen that is new, acute  Fever of 100F or higher  For urgent or emergent issues, a gastroenterologist can be reached at any hour by calling [phone]. Do not use MyChart messaging for urgent concerns.    DIET:  We do recommend a small meal at first, but then you may proceed to your regular diet.  Drink plenty of fluids but you should avoid alcoholic beverages for 24 hours.  ACTIVITY:  You should  plan to take it easy for the rest of today and you should NOT DRIVE or use heavy machinery until tomorrow (because of the sedation medicines used during the test).    FOLLOW UP: Our staff will call the number listed on your records 48-72 hours following your procedure to check on you and address any questions or concerns that you may have regarding the information given to you following your procedure. If we do not reach you, we will leave a message.  We will attempt to reach you two times.  During this call, we will ask if you have developed any symptoms of COVID 19. If you develop any symptoms (ie: fever, flu-like symptoms, shortness of breath, cough etc.) before then, please call [phone].  If you test positive for Covid 19 in the 2 weeks post procedure, please call and report this information to [place].    If any biopsies were taken you will be contacted by phone or by letter within the next 1-3 weeks.  Please call us at [phone] if you have not heard about the biopsies in 3 weeks.    SIGNATURES/CONFIDENTIALITY: You and/or your care partner have signed paperwork which will be entered into your electronic medical record.  These signatures attest to the fact that that the information above on your After Visit Summary has been reviewed and is understood.  Full responsibility of the confidentiality of this discharge information lies with you and/or your care-partner.

## 2020-05-25 NOTE — Progress Notes (Signed)
Pt's states no medical or surgical changes since previsit or office visit. 

## 2020-05-25 NOTE — Op Note (Signed)
[facility] Patient Name: Kimberly Sosa Procedure Date: 05/25/2020 7:29 AM MRN: [id number] Endoscopist: Ladene Artist , MD Age: 60 Referring MD:  Date of Birth: 08-15-1960 Gender: Female Account #: [id number] Procedure:                Colonoscopy Indications:              Surveillance: Personal history of adenomatous                            polyps on last colonoscopy > 3 years ago Medicines:                Monitored Anesthesia Care Procedure:                Pre-Anesthesia Assessment:                           - Prior to the procedure, a History and Physical                            was performed, and patient medications and                            allergies were reviewed. The patient's tolerance of                            previous anesthesia was also reviewed. The risks                            and benefits of the procedure and the sedation                            options and risks were discussed with the patient.                            All questions were answered, and informed consent                            was obtained. Prior Anticoagulants: The patient has                            taken no previous anticoagulant or antiplatelet                            agents. ASA Grade Assessment: II - A patient with                            mild systemic disease. After reviewing the risks                            and benefits, the patient was deemed in                            satisfactory condition to undergo the procedure.  After obtaining informed consent, the colonoscope                            was passed under direct vision. Throughout the                            procedure, the patient's blood pressure, pulse, and                            oxygen saturations were monitored continuously. The                            Olympus CF-HQ190 [id number]) Colonoscope was                            introduced through the  anus and advanced to the the                            cecum, identified by appendiceal orifice and                            ileocecal valve. The ileocecal valve, appendiceal                            orifice, and rectum were photographed. The quality                            of the bowel preparation was adequate after                            extensive lavage, suction. The colonoscopy was                            performed without difficulty. The patient tolerated                            the procedure well. Scope In: 8:11:17 AM Scope Out: 8:31:52 AM Scope Withdrawal Time: 0 hours 16 minutes 25 seconds  Total Procedure Duration: 0 hours 20 minutes 35 seconds  Findings:                 The perianal and digital rectal examinations were                            normal.                           Five sessile polyps were found in the descending                            colon. The polyps were 7 to 8 mm in size. These                            polyps were removed with a cold snare. Resection  and retrieval were complete.                           Multiple small-mouthed diverticula were found in                            the left colon. There was no evidence of                            diverticular bleeding.                           Anal papilla(e) were hypertrophied.                           Internal hemorrhoids were found during                            retroflexion. The hemorrhoids were small and Grade                            I (internal hemorrhoids that do not prolapse).                           The exam was otherwise without abnormality on                            direct and retroflexion views. Complications:            No immediate complications. Estimated blood loss:                            None. Estimated Blood Loss:     Estimated blood loss: none. Impression:               - Five 7 to 8 mm polyps in the descending colon,                             removed with a cold snare. Resected and retrieved.                           - Mild diverticulosis in the left colon. There was                            no evidence of diverticular bleeding.                           - Anal papilla(e) were hypertrophied.                           - Internal hemorrhoids.                           - The examination was otherwise normal on direct  and retroflexion views. Recommendation:           - Repeat colonoscopy date to be determined after                            pending pathology results are reviewed for                            surveillance based on pathology results with a more                            extensive bowel prep.                           - Patient has a contact number available for                            emergencies. The signs and symptoms of potential                            delayed complications were discussed with the                            patient. Return to normal activities tomorrow.                            Written discharge instructions were provided to the                            patient.                           - Resume previous diet.                           - Continue present medications.                           - Await pathology results. Ladene Artist, MD 05/25/2020 8:36:43 AM This report has been signed electronically.

## 2020-05-25 NOTE — Progress Notes (Signed)
Called to room to assist during endoscopic procedure.  Patient ID and intended procedure confirmed with present staff. Received instructions for my participation in the procedure from the performing physician.  

## 2020-05-29 ENCOUNTER — Telehealth: Payer: Self-pay | Admitting: *Deleted

## 2020-05-29 NOTE — Telephone Encounter (Signed)
  Follow up Call-  Call back number 05/25/2020  Post procedure Call Back phone  # [phone]  Permission to leave phone message Yes  Some recent data might be hidden     Patient questions:  Do you have a fever, pain , or abdominal swelling? No. Pain Score  0 *  Have you tolerated food without any problems? Yes.    Have you been able to return to your normal activities? Yes.    Do you have any questions about your discharge instructions: Diet   No. Medications  No. Follow up visit  No.  Do you have questions or concerns about your Care? No.  Actions: * If pain score is 4 or above: No action needed, pain <4.  1. Have you developed a fever since your procedure? no  2.   Have you had an respiratory symptoms (SOB or cough) since your procedure? no  3.   Have you tested positive for COVID 19 since your procedure no  4.   Have you had any family members/close contacts diagnosed with the COVID 19 since your procedure?  no   If yes to any of these questions please route to Joylene John, RN and Joella Prince, RN

## 2020-05-30 ENCOUNTER — Other Ambulatory Visit: Payer: Self-pay

## 2020-05-30 ENCOUNTER — Ambulatory Visit: Payer: Medicare HMO | Admitting: Podiatry

## 2020-05-30 ENCOUNTER — Encounter: Payer: Self-pay | Admitting: Podiatry

## 2020-05-30 ENCOUNTER — Ambulatory Visit: Payer: Medicare HMO | Attending: Family Medicine | Admitting: Family Medicine

## 2020-05-30 ENCOUNTER — Encounter: Payer: Self-pay | Admitting: Family Medicine

## 2020-05-30 VITALS — BP 122/80 | HR 72 | Ht 67.0 in | Wt 245.0 lb

## 2020-05-30 DIAGNOSIS — M19079 Primary osteoarthritis, unspecified ankle and foot: Secondary | ICD-10-CM

## 2020-05-30 DIAGNOSIS — E7800 Pure hypercholesterolemia, unspecified: Secondary | ICD-10-CM | POA: Diagnosis not present

## 2020-05-30 DIAGNOSIS — R21 Rash and other nonspecific skin eruption: Secondary | ICD-10-CM

## 2020-05-30 DIAGNOSIS — M5431 Sciatica, right side: Secondary | ICD-10-CM | POA: Diagnosis not present

## 2020-05-30 DIAGNOSIS — M19072 Primary osteoarthritis, left ankle and foot: Secondary | ICD-10-CM

## 2020-05-30 DIAGNOSIS — M79672 Pain in left foot: Secondary | ICD-10-CM

## 2020-05-30 DIAGNOSIS — I10 Essential (primary) hypertension: Secondary | ICD-10-CM | POA: Diagnosis not present

## 2020-05-30 DIAGNOSIS — E669 Obesity, unspecified: Secondary | ICD-10-CM

## 2020-05-30 MED ORDER — PRAVASTATIN SODIUM 40 MG PO TABS
ORAL_TABLET | Freq: Every day | ORAL | 1 refills | Status: DC
Start: 2020-05-30 — End: 2020-12-24
  Filled 2020-05-30 – 2020-06-25 (×2): qty 90, 90d supply, fill #0
  Filled 2020-09-26: qty 90, 90d supply, fill #1

## 2020-05-30 MED ORDER — TRAMADOL HCL 50 MG PO TABS
ORAL_TABLET | ORAL | 0 refills | Status: AC
  Filled 2020-05-30: qty 60, 30d supply, fill #0

## 2020-05-30 MED ORDER — TRIAMCINOLONE ACETONIDE 0.1 % EX CREA
1.0000 "application " | TOPICAL_CREAM | Freq: Two times a day (BID) | CUTANEOUS | 1 refills | Status: DC
  Filled 2020-05-30: qty 30, 15d supply, fill #0

## 2020-05-30 MED ORDER — LISINOPRIL-HYDROCHLOROTHIAZIDE 10-12.5 MG PO TABS
1.0000 | ORAL_TABLET | Freq: Every day | ORAL | 1 refills | Status: DC
Start: 2020-05-30 — End: 2020-12-31
  Filled 2020-05-30 – 2020-07-09 (×2): qty 90, 90d supply, fill #0
  Filled 2020-10-04: qty 90, 90d supply, fill #1

## 2020-05-30 NOTE — Progress Notes (Signed)
Subjective:  Patient ID: Kimberly Sosa, female    DOB: 1960/06/30  Age: 60 y.o. MRN: [id number]  CC: Hypertension   HPI Kimberly Sosa is a 60 year old female with a history ofhypertension, hypercholesterolemia, degenerative disease of the lumbar spinehere for chronic disease management. She complains of right posterior thigh pain and L foot pain. Saw Dr Posey Pronto of [facility] this morning and is wearing a boot which she will be wearing for 4 weeks. States she was diagnosed with Arthritis. She uses Tramadol prn at night and alternates with Tylenol When she sits on a low seat  She has a numb sensation with associated pain radiating down her posterior R thigh and finds that she has to walk around to relieve her symptoms.  Pain is absent on standing and is absent at this time.  Compliant with her antihypertensive and her statin which she is tolerating with no adverse effects.  She has no additional concerns at this time. Past Medical History:  Diagnosis Date  . Active smoker   . Allergy   . Anemia    past hx   . Back pain   . Carpal tunnel syndrome    LEFT HAND  . DJD (degenerative joint disease), lumbar   . Hypercholesterolemia   . Hypertension   . Kidney stone   . Pinched nerve in neck     Past Surgical History:  Procedure Laterality Date  . APPENDECTOMY    . COLONOSCOPY    . POLYPECTOMY    . ROTATOR CUFF REPAIR    . 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 TOTAL HIP ARTHROPLASTY Right 07/08/2016   Procedure: RIGHT TOTAL HIP ARTHROPLASTY ANTERIOR APPROACH;  Surgeon: Paralee Cancel, MD;  Location: [facility];  Service: Orthopedics;  Laterality: Right;    Family History  Problem Relation Age of Onset  . Diabetes Mother   . Hypertension Mother   . Heart failure Mother   . Prostate cancer Father    . Diabetes Father   . Diabetes Sister   . Diabetes Brother        3 brothers deceased - CHF  . Healthy Sister   . Breast cancer Maternal Grandmother   . Colon cancer Neg Hx   . Rectal cancer Neg Hx   . Esophageal cancer Neg Hx   . Liver cancer Neg Hx   . Colon polyps Neg Hx     Allergies  Allergen Reactions  . Citrus Itching    Peaches  Oranges   . Hydrocodone Other (See Comments)    Causes Headaches  . Metronidazole Itching  . Mobic [Meloxicam] Other (See Comments)    Causes Headaches  . Tetracyclines & Related Swelling    All over body swelling    Outpatient Medications Prior to Visit  Medication Sig Dispense Refill  . Cholecalciferol (VITAMIN D3 PO) Take by mouth.    . cyclobenzaprine (FLEXERIL) 10 MG tablet TAKE 1 TABLET (10 MG TOTAL) BY MOUTH 2 (TWO) TIMES DAILY AS NEEDED FOR MUSCLE SPASMS. 60 tablet 2  . diclofenac Sodium (VOLTAREN) 1 % GEL Apply 2 g topically 4 (four) times daily. 100 g 0  . diphenhydrAMINE (BENADRYL ALLERGY) 25 MG tablet Take 1 tablet (25 mg total) by mouth every 6 (six) hours as needed. 20 tablet 0  . ibuprofen (ADVIL) 600 MG tablet  TAKE 1 TABLET (600 MG TOTAL) BY MOUTH EVERY 8 (EIGHT) HOURS AS NEEDED. 60 tablet 1  . lisinopril-hydrochlorothiazide (ZESTORETIC) 10-12.5 MG tablet TAKE 1 TABLET BY MOUTH DAILY. 90 tablet 1  . loratadine (CLARITIN) 10 MG tablet Take 10 mg by mouth daily.    . pravastatin (PRAVACHOL) 40 MG tablet TAKE 1 TABLET (40 MG TOTAL) BY MOUTH DAILY. 90 tablet 1  . traMADol (ULTRAM) 50 MG tablet TAKE 1 TABLET (50 MG TOTAL) BY MOUTH EVERY 12 (TWELVE) HOURS AS NEEDED FOR SEVERE PAIN. 60 tablet 0  . triamcinolone cream (KENALOG) 0.1 % Apply 1 application topically 2 (two) times daily. 30 g 0   No facility-administered medications prior to visit.     ROS Review of Systems  Constitutional: Negative for activity change, appetite change and fatigue.  HENT: Negative for congestion, sinus pressure and sore throat.   Eyes: Negative for  visual disturbance.  Respiratory: Negative for cough, chest tightness, shortness of breath and wheezing.   Cardiovascular: Negative for chest pain and palpitations.  Gastrointestinal: Negative for abdominal distention, abdominal pain and constipation.  Endocrine: Negative for polydipsia.  Genitourinary: Negative for dysuria and frequency.  Musculoskeletal:       See HPI  Skin: Negative for rash.  Neurological: Negative for tremors, light-headedness and numbness.  Hematological: Does not bruise/bleed easily.  Psychiatric/Behavioral: Negative for agitation and behavioral problems.    Objective:  BP 122/80   Pulse 72   Ht 5\' 7"  (1.702 m)   Wt 245 lb (111.1 kg)   SpO2 100%   BMI 38.37 kg/m   BP/Weight 05/30/2020 05/25/2020 7/82/9562  Systolic BP 130 865 784  Diastolic BP 80 83 85  Wt. (Lbs) 245 239 241  BMI 38.37 37.43 37.19      Physical Exam Constitutional:      Appearance: She is well-developed.  Neck:     Vascular: No JVD.  Cardiovascular:     Rate and Rhythm: Normal rate.     Heart sounds: Normal heart sounds. No murmur heard.   Pulmonary:     Effort: Pulmonary effort is normal.     Breath sounds: Normal breath sounds. No wheezing or rales.  Chest:     Chest wall: No tenderness.  Abdominal:     General: Bowel sounds are normal. There is no distension.     Palpations: Abdomen is soft. There is no mass.     Tenderness: There is no abdominal tenderness.  Musculoskeletal:     Right lower leg: No edema.     Left lower leg: No edema.     Comments: L foot in a boot Negative straight leg b/l No hip or thigh TTP b/l or on ROM  Neurological:     Mental Status: She is alert and oriented to person, place, and time.  Psychiatric:        Mood and Affect: Mood normal.     CMP Latest Ref Rng & Units 12/19/2019 08/23/2019 04/14/2018  Glucose 65 - 99 mg/dL 94 83 103(H)  BUN 6 - 24 mg/dL 16 19 13   Creatinine 0.57 - 1.00 mg/dL 0.91 1.02(H) 1.11(H)  Sodium 134 - 144 mmol/L  140 144 142  Potassium 3.5 - 5.2 mmol/L 4.1 3.9 4.1  Chloride 96 - 106 mmol/L 105 105 105  CO2 20 - 29 mmol/L 20 21 19(L)  Calcium 8.7 - 10.2 mg/dL 9.9 10.0 9.5  Total Protein 6.0 - 8.5 g/dL 7.0 6.9 6.9  Total Bilirubin 0.0 - 1.2 mg/dL <0.2 <0.2  0.3  Alkaline Phos 44 - 121 IU/L 123(H) 117 127(H)  AST 0 - 40 IU/L 15 14 15   ALT 0 - 32 IU/L 11 8 10     Lipid Panel     Component Value Date/Time   CHOL 189 12/19/2019 0949   TRIG 86 12/19/2019 0949   HDL 58 12/19/2019 0949   CHOLHDL 3.3 12/19/2019 0949   LDLCALC 115 (H) 12/19/2019 0949    CBC    Component Value Date/Time   WBC 13.8 (H) 09/16/2018 1017   WBC 14.2 (H) 07/09/2016 0509   RBC 4.43 09/16/2018 1017   RBC 3.10 (L) 07/09/2016 0509   HGB 13.0 09/16/2018 1017   HCT 39.7 09/16/2018 1017   PLT 418 09/16/2018 1017   MCV 90 09/16/2018 1017   MCH 29.3 09/16/2018 1017   MCH 29.0 07/09/2016 0509   MCHC 32.7 09/16/2018 1017   MCHC 33.0 07/09/2016 0509   RDW 13.3 09/16/2018 1017   LYMPHSABS 5.3 (H) 09/16/2018 1017   EOSABS 0.2 09/16/2018 1017   BASOSABS 0.1 09/16/2018 1017    Lab Results  Component Value Date   HGBA1C 5.9 (H) 06/03/2017    Assessment & Plan:  1. Obesity (BMI 35.0-39.9 without comorbidity) Reduce portion sizes, avoid late meals Increase physical activity gradually as tolerated up to 150 minutes of moderate intensity exercise per week  2. Essential hypertension Controlled Counseled on blood pressure goal of less than 130/80, low-sodium, DASH diet, medication compliance, 150 minutes of moderate intensity exercise per week. Discussed medication compliance, adverse effects. - lisinopril-hydrochlorothiazide (ZESTORETIC) 10-12.5 MG tablet; TAKE 1 TABLET BY MOUTH DAILY.  Dispense: 90 tablet; Refill: 1  3. Left foot pain Currently wearing a boot and will continue this for the next 4 weeks Followed by [facility] - traMADol (ULTRAM) 50 MG tablet; TAKE 1 TABLET (50 MG TOTAL) BY MOUTH EVERY 12 (TWELVE)  HOURS AS NEEDED FOR SEVERE PAIN.  Dispense: 60 tablet; Refill: 0  4. Hypercholesterolemia Controlled Low-cholesterol diet - pravastatin (PRAVACHOL) 40 MG tablet; TAKE 1 TABLET (40 MG TOTAL) BY MOUTH DAILY.  Dispense: 90 tablet; Refill: 1  5. Rash and nonspecific skin eruption - triamcinolone cream (KENALOG) 0.1 %; Apply 1 application topically 2 (two) times daily.  Dispense: 30 g; Refill: 1  6. Sciatica of right side Demonstrated back exercises She is currently on a muscle relaxant and tramadol at this time Discussed the benefit of referral for PT if she so desires but she would like to hold off at this time  No orders of the defined types were placed in this encounter.   Follow-up: Return in about 1 month (around 06/29/2020) for complete physical exam.       Charlott Rakes, MD, FAAFP. [facility] and [place], [place]   05/30/2020, 1:51 PM

## 2020-05-30 NOTE — Progress Notes (Signed)
Subjective:  Patient ID: Kimberly Sosa, female    DOB: August 10, 1960,  MRN: [id number]  Chief Complaint  Patient presents with  . Foot Pain     Esperanza Heir)    urgent referral   np-left foot pain with swelling, possible osteomyeolitis-non req-dr. Asencion Noble refer    60 y.o. female presents with the above complaint.  Patient presents with complaint of left foot pain that has been going on for quite some time.  Patient was referred by her primary care physician for concern for osteomyelitis.  She has never had a wound to the left side.  She states that she is not a diabetic.  She would like to get this left foot pain evaluated.  She denies any other acute complaints.  She has not had any acute trauma.  She wears regular Environmental health practitioner.  She has not done anything for the pain besides applying Vicks VapoRub and some over-the-counter cream.  None of which has given her relief.  Her pain scale 7 out of 10.   Review of Systems: Negative except as noted in the HPI. Denies N/V/F/Ch.  Past Medical History:  Diagnosis Date  . Active smoker   . Allergy   . Anemia    past hx   . Back pain   . Carpal tunnel syndrome    LEFT HAND  . DJD (degenerative joint disease), lumbar   . Hypercholesterolemia   . Hypertension   . Kidney stone   . Pinched nerve in neck     Current Outpatient Medications:  .  Cholecalciferol (VITAMIN D3 PO), Take by mouth., Disp: , Rfl:  .  cyclobenzaprine (FLEXERIL) 10 MG tablet, TAKE 1 TABLET (10 MG TOTAL) BY MOUTH 2 (TWO) TIMES DAILY AS NEEDED FOR MUSCLE SPASMS., Disp: 60 tablet, Rfl: 2 .  diclofenac Sodium (VOLTAREN) 1 % GEL, Apply 2 g topically 4 (four) times daily., Disp: 100 g, Rfl: 0 .  diphenhydrAMINE (BENADRYL ALLERGY) 25 MG tablet, Take 1 tablet (25 mg total) by mouth every 6 (six) hours as needed., Disp: 20 tablet, Rfl: 0 .  ibuprofen (ADVIL) 600 MG tablet, TAKE 1 TABLET (600 MG TOTAL) BY MOUTH EVERY 8 (EIGHT) HOURS AS NEEDED., Disp: 60 tablet, Rfl: 1 .   lisinopril-hydrochlorothiazide (ZESTORETIC) 10-12.5 MG tablet, TAKE 1 TABLET BY MOUTH DAILY., Disp: 90 tablet, Rfl: 1 .  loratadine (CLARITIN) 10 MG tablet, Take 10 mg by mouth daily., Disp: , Rfl:  .  pravastatin (PRAVACHOL) 40 MG tablet, TAKE 1 TABLET (40 MG TOTAL) BY MOUTH DAILY., Disp: 90 tablet, Rfl: 1 .  traMADol (ULTRAM) 50 MG tablet, TAKE 1 TABLET (50 MG TOTAL) BY MOUTH EVERY 12 (TWELVE) HOURS AS NEEDED FOR SEVERE PAIN., Disp: 60 tablet, Rfl: 0 .  triamcinolone cream (KENALOG) 0.1 %, Apply 1 application topically 2 (two) times daily., Disp: 30 g, Rfl: 0  Social History   Tobacco Use  Smoking Status Current Every Day Smoker  . Packs/day: 0.30  . Types: Cigarettes  . Last attempt to quit: 07/05/2016  . Years since quitting: 3.9  Smokeless Tobacco Never Used  Tobacco Comment   1 pack every 3 days    Allergies  Allergen Reactions  . Citrus Itching    Peaches  Oranges   . Hydrocodone Other (See Comments)    Causes Headaches  . Metronidazole Itching  . Mobic [Meloxicam] Other (See Comments)    Causes Headaches  . Tetracyclines & Related Swelling    All over body swelling   Objective:  There were no vitals filed for this visit. There is no height or weight on file to calculate BMI. Constitutional Well developed. Well nourished.  Vascular Dorsalis pedis pulses palpable bilaterally. Posterior tibial pulses palpable bilaterally. Capillary refill normal to all digits.  No cyanosis or clubbing noted. Pedal hair growth normal.  Neurologic Normal speech. Oriented to person, place, and time. Epicritic sensation to light touch grossly present bilaterally.  Dermatologic Nails well groomed and normal in appearance. No open wounds. No skin lesions.  Orthopedic:  Pain on palpation left dorsal midfoot.  Exostosis palpated.  Pain with resisted dorsiflexion of the digit.  No pain with resisted plantarflexion of the digit.  No pain at the bunion.  Mild Achilles tendinitis pain secondary  to compensation   IMPRESSION: Degenerative changes are noted of the midfoot and first metatarsophalangeal joint. There are areas of lucency at the second tarsometatarsal joint which may be related to osteoarthritis. However, given the surrounding soft tissue swelling and history of diabetes, a diagnosis of osteomyelitis should be considered. If there is clinical concern for osteomyelitis, follow-up with a contrast-enhanced MRI of the foot is recommended. Assessment:   1. Arthritis of midfoot    Plan:  Patient was evaluated and treated and all questions answered.  Left dorsal midfoot arthritis with underlying extensor tendinitis -I explained to the patient the etiology of arthritis and various treatment options were discussed.  Ultimately given her pain that I believe she will benefit from cam boot immobilization.  Cam boot was dispensed to allow the soft tissue to heal appropriately.  If there is any residual pain we will discuss doing a steroid injection during next clinical visit.  She states understanding. -Cam boot was dispensed  No follow-ups on file.

## 2020-05-30 NOTE — Progress Notes (Signed)
Pain in the back of her right thigh. Pain in foot.

## 2020-05-31 ENCOUNTER — Other Ambulatory Visit: Payer: Self-pay

## 2020-06-01 IMAGING — MG MM CLIP PLACEMENT
4 series · 4 of 12 positions shown · non-contrast
Comparison: Previous exam(s).

CLINICAL DATA: Evaluate biopsy markers.

EXAM:
DIAGNOSTIC RIGHT MAMMOGRAM POST ULTRASOUND BIOPSY

[R CC synth-2D]
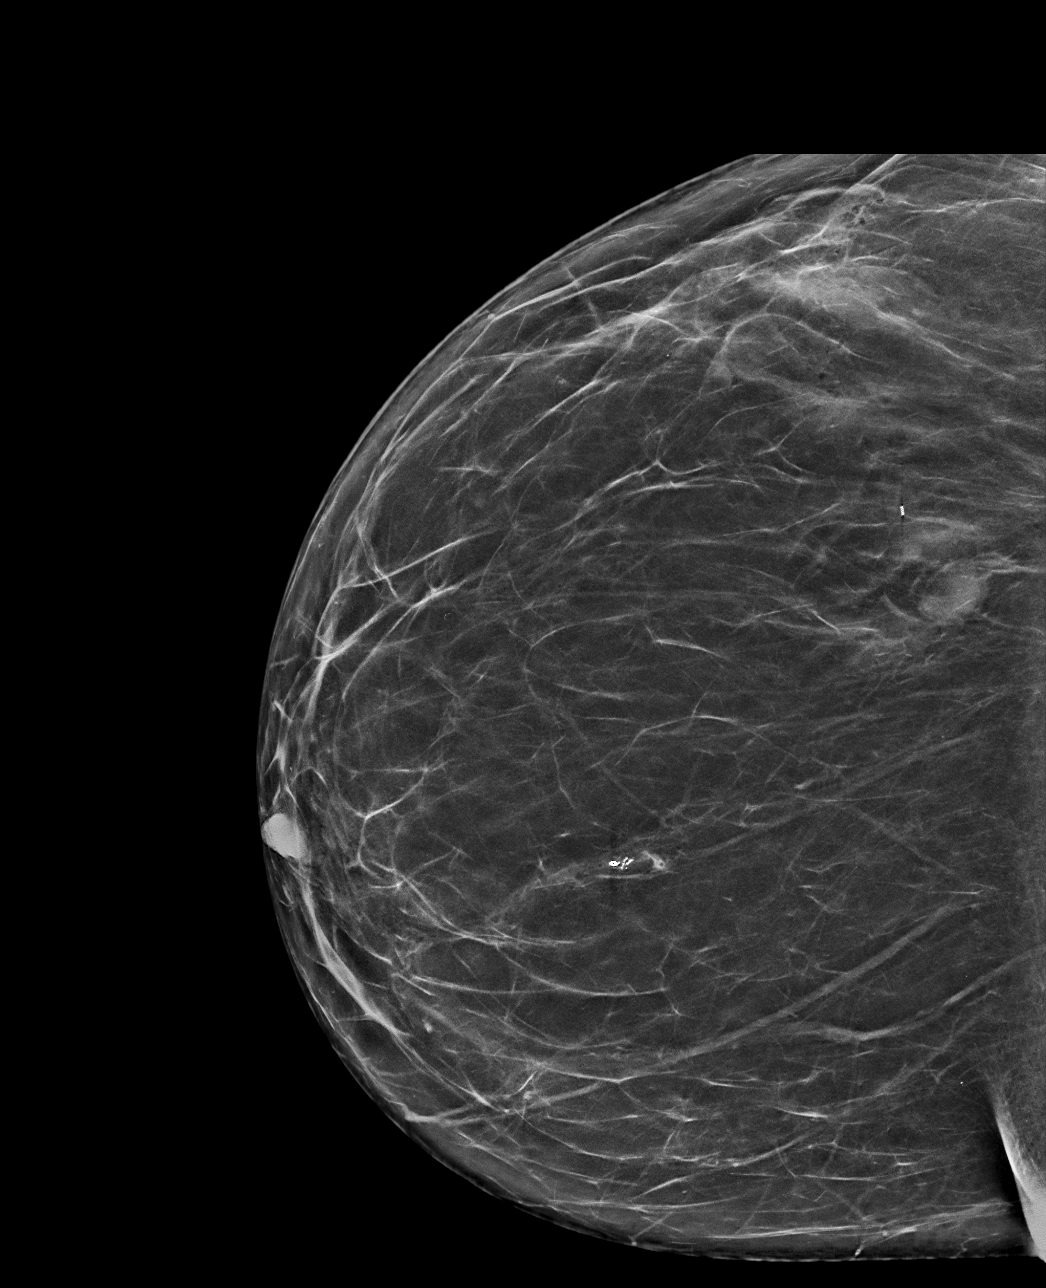

[R ML synth-2D]
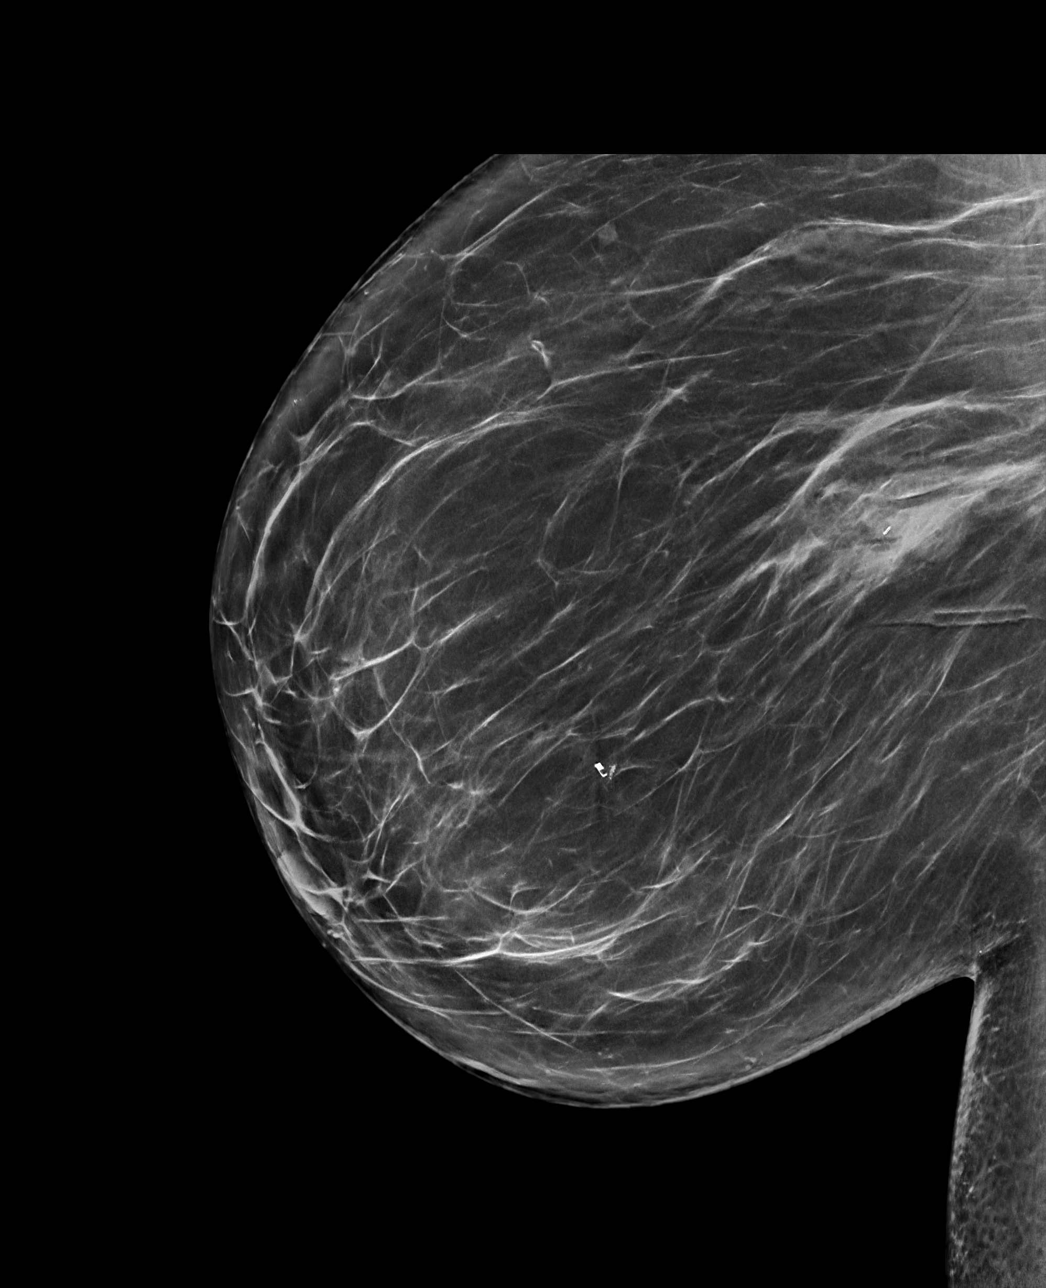

[R CC tomo · tomo slice 39/77.0]
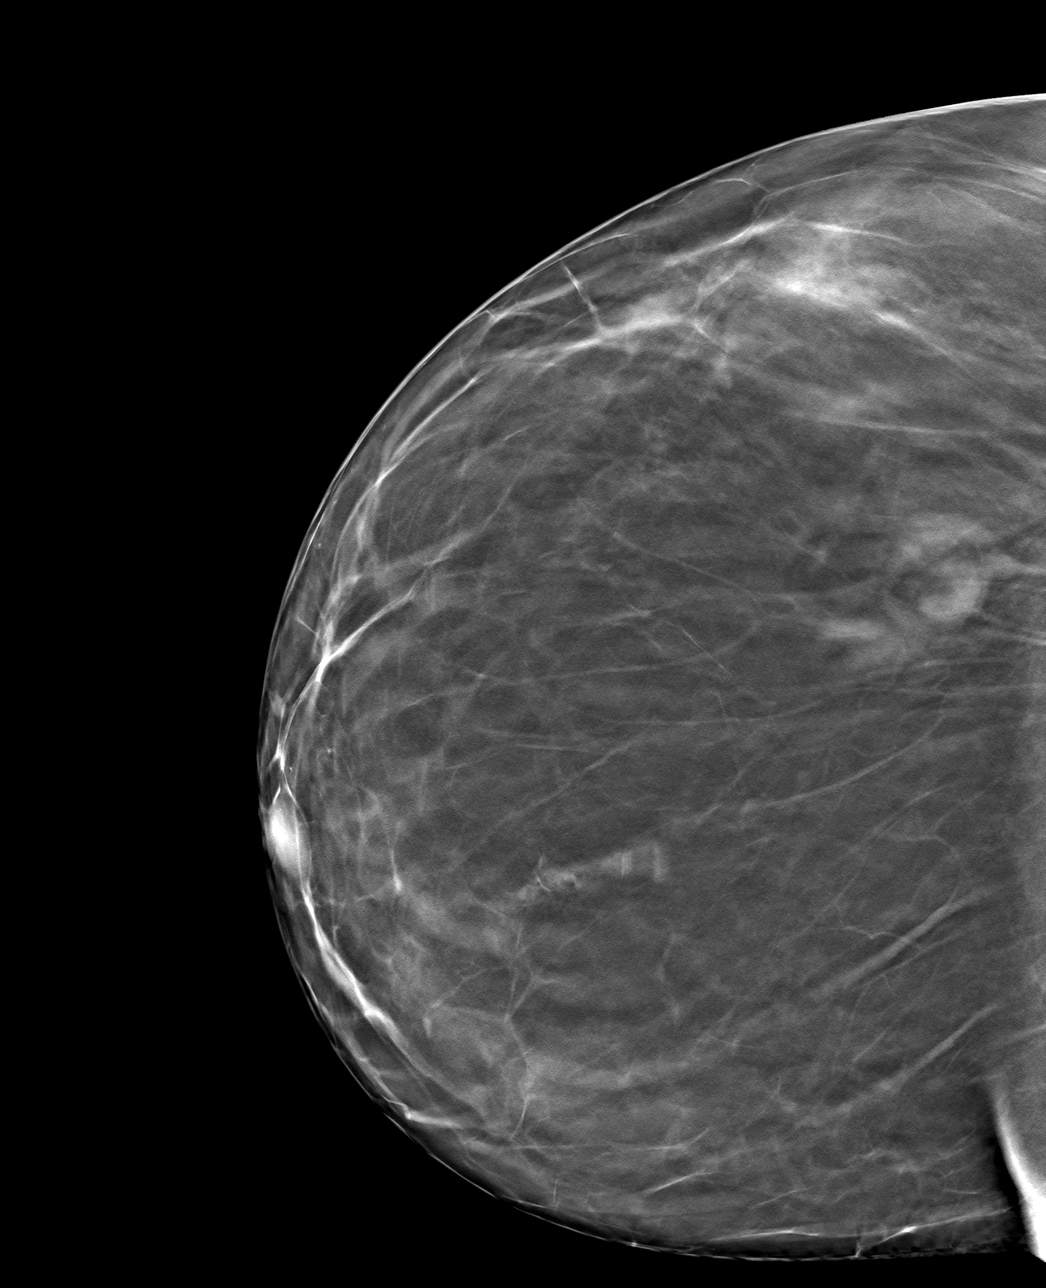

[R ML tomo · tomo slice 43/85.0]
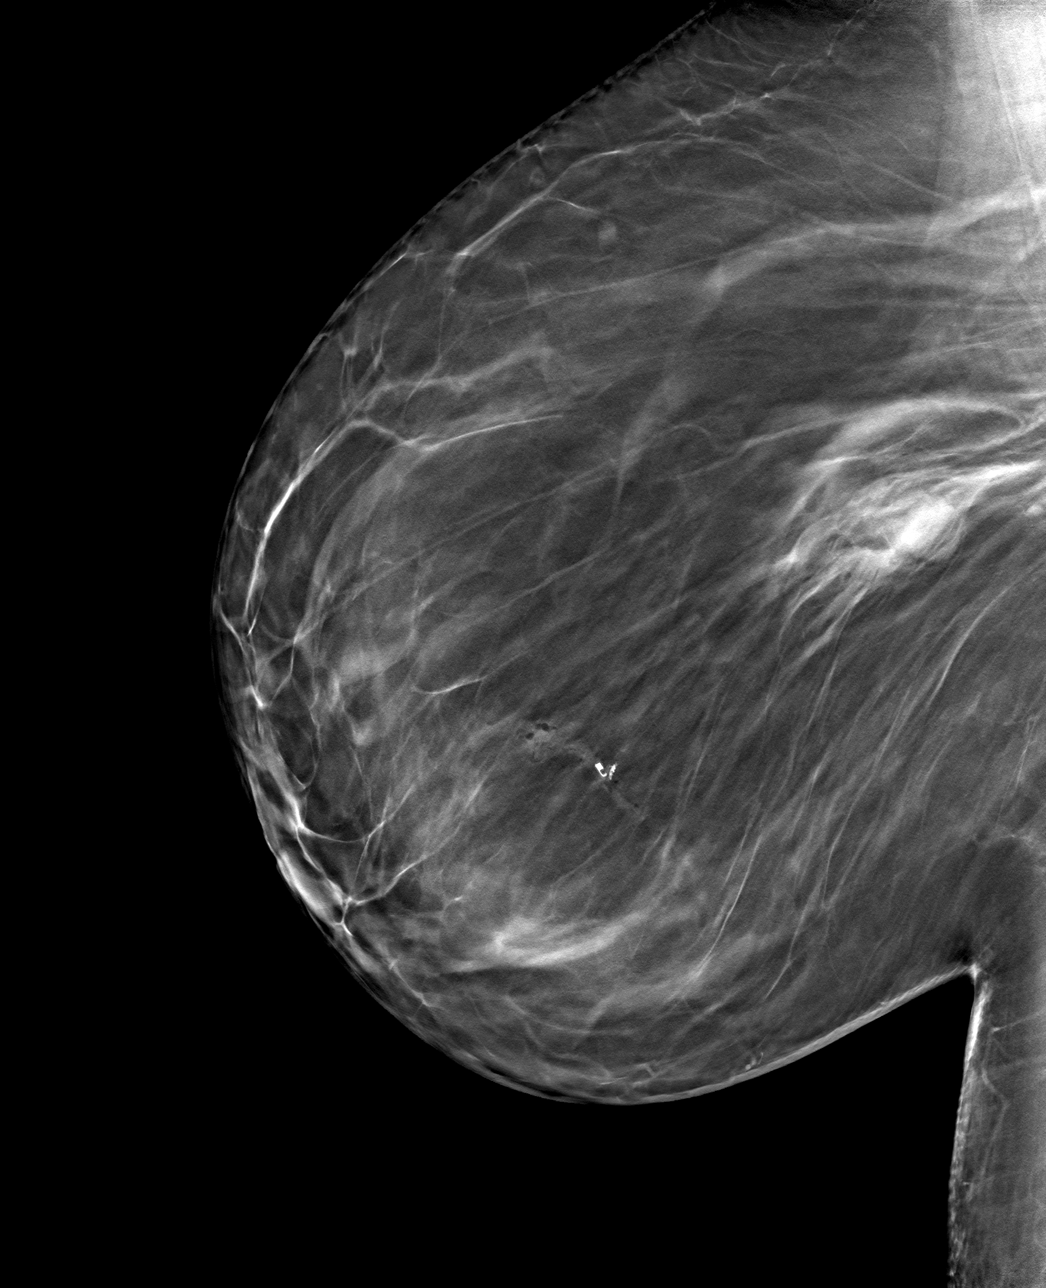

[4 of 12 positions shown; findings below may reference images not displayed]

FINDINGS: Mammographic images were obtained following ultrasound guided biopsy
of a group of right breast calcifications and intramammary right
lymph node. The coil shaped clip is at the site of the biopsied
calcifications. Calcifications remain. The clip associated with the
intramammary lymph node migrated 14 mm lateral to the center of the
lymph node.
IMPRESSION: The coil shaped clip is at the site of biopsied calcifications. The
HydroMARK shaped clip is 14 mm lateral to the middle of the biopsied
lymph node.

Final Assessment: Post Procedure Mammograms for Marker Placement

## 2020-06-04 ENCOUNTER — Encounter: Payer: Self-pay | Admitting: Gastroenterology

## 2020-06-18 ENCOUNTER — Ambulatory Visit: Payer: Medicare HMO | Admitting: Family Medicine

## 2020-06-25 ENCOUNTER — Other Ambulatory Visit: Payer: Self-pay

## 2020-06-26 ENCOUNTER — Other Ambulatory Visit: Payer: Self-pay

## 2020-06-27 ENCOUNTER — Other Ambulatory Visit: Payer: Self-pay

## 2020-06-27 ENCOUNTER — Ambulatory Visit (INDEPENDENT_AMBULATORY_CARE_PROVIDER_SITE_OTHER): Payer: Medicare HMO | Admitting: Podiatry

## 2020-06-27 DIAGNOSIS — M778 Other enthesopathies, not elsewhere classified: Secondary | ICD-10-CM | POA: Diagnosis not present

## 2020-06-27 DIAGNOSIS — M19072 Primary osteoarthritis, left ankle and foot: Secondary | ICD-10-CM

## 2020-06-27 DIAGNOSIS — M19079 Primary osteoarthritis, unspecified ankle and foot: Secondary | ICD-10-CM

## 2020-06-29 ENCOUNTER — Encounter: Payer: Self-pay | Admitting: Podiatry

## 2020-06-29 NOTE — Progress Notes (Signed)
Subjective:  Patient ID: Kimberly Sosa, female    DOB: 22-Aug-1960,  MRN: [id number]  Chief Complaint  Patient presents with  . Arthritis    Left foot pain     60 y.o. female presents with the above complaint.  Patient presents with a follow-up of midfoot arthritis/extensor tendinitis.  Patient states the pain has improved with cam boot immobilization.  She states that her pain is getting better with time.  She would like to know if she can get a steroid injection and if she can come out of the boot.  She denies any other acute complaints.  Review of Systems: Negative except as noted in the HPI. Denies N/V/F/Ch.  Past Medical History:  Diagnosis Date  . Active smoker   . Allergy   . Anemia    past hx   . Back pain   . Carpal tunnel syndrome    LEFT HAND  . DJD (degenerative joint disease), lumbar   . Hypercholesterolemia   . Hypertension   . Kidney stone   . Pinched nerve in neck     Current Outpatient Medications:  .  Cholecalciferol (VITAMIN D3 PO), Take by mouth., Disp: , Rfl:  .  diclofenac Sodium (VOLTAREN) 1 % GEL, Apply 2 g topically 4 (four) times daily., Disp: 100 g, Rfl: 0 .  diphenhydrAMINE (BENADRYL ALLERGY) 25 MG tablet, Take 1 tablet (25 mg total) by mouth every 6 (six) hours as needed., Disp: 20 tablet, Rfl: 0 .  ibuprofen (ADVIL) 600 MG tablet, TAKE 1 TABLET (600 MG TOTAL) BY MOUTH EVERY 8 (EIGHT) HOURS AS NEEDED., Disp: 60 tablet, Rfl: 1 .  lisinopril-hydrochlorothiazide (ZESTORETIC) 10-12.5 MG tablet, TAKE 1 TABLET BY MOUTH DAILY., Disp: 90 tablet, Rfl: 1 .  loratadine (CLARITIN) 10 MG tablet, Take 10 mg by mouth daily., Disp: , Rfl:  .  pravastatin (PRAVACHOL) 40 MG tablet, TAKE 1 TABLET (40 MG TOTAL) BY MOUTH DAILY., Disp: 90 tablet, Rfl: 1 .  traMADol (ULTRAM) 50 MG tablet, TAKE 1 TABLET (50 MG TOTAL) BY MOUTH EVERY 12 (TWELVE) HOURS AS NEEDED FOR SEVERE PAIN., Disp: 60 tablet, Rfl: 0 .  triamcinolone cream (KENALOG) 0.1 %, Apply 1 application  topically 2 (two) times daily., Disp: 30 g, Rfl: 1  Social History   Tobacco Use  Smoking Status Current Every Day Smoker  . Packs/day: 0.30  . Types: Cigarettes  . Last attempt to quit: 07/05/2016  . Years since quitting: 3.9  Smokeless Tobacco Never Used  Tobacco Comment   1 pack every 3 days    Allergies  Allergen Reactions  . Citrus Itching    Peaches  Oranges   . Hydrocodone Other (See Comments)    Causes Headaches  . Metronidazole Itching  . Mobic [Meloxicam] Other (See Comments)    Causes Headaches  . Tetracyclines & Related Swelling    All over body swelling   Objective:  There were no vitals filed for this visit. There is no height or weight on file to calculate BMI. Constitutional Well developed. Well nourished.  Vascular Dorsalis pedis pulses palpable bilaterally. Posterior tibial pulses palpable bilaterally. Capillary refill normal to all digits.  No cyanosis or clubbing noted. Pedal hair growth normal.  Neurologic Normal speech. Oriented to person, place, and time. Epicritic sensation to light touch grossly present bilaterally.  Dermatologic Nails well groomed and normal in appearance. No open wounds. No skin lesions.  Orthopedic:  Pain on palpation left dorsal midfoot.  Exostosis palpated.  Pain with resisted dorsiflexion  of the digit.  No pain with resisted plantarflexion of the digit.  No pain at the bunion.  Mild Achilles tendinitis pain secondary to compensation   IMPRESSION: Degenerative changes are noted of the midfoot and first metatarsophalangeal joint. There are areas of lucency at the second tarsometatarsal joint which may be related to osteoarthritis. However, given the surrounding soft tissue swelling and history of diabetes, a diagnosis of osteomyelitis should be considered. If there is clinical concern for osteomyelitis, follow-up with a contrast-enhanced MRI of the foot is recommended. Assessment:   1. Arthritis of midfoot   2.  Extensor tendinitis of foot    Plan:  Patient was evaluated and treated and all questions answered.  Left dorsal midfoot arthritis with underlying extensor tendinitis -I explained to the patient the etiology of arthritis and various treatment options were discussed.  Clinically pain improved about 7080% with cam boot immobilization.  She still has some residual pain left.  I believe she will benefit from a steroid injection to help decrease acute inflammatory component associate with pain.  Patient agrees with the plan.  She would like to proceed with a steroid injection -A steroid injection was performed at left dorsal midfoot using 1% plain Lidocaine and 10 mg of Kenalog. This was well tolerated. -We will begin transition from cam boot into a Tri-Lock ankle brace to give stability while ambulating.  Patient states understanding.  No follow-ups on file.

## 2020-07-09 ENCOUNTER — Other Ambulatory Visit: Payer: Self-pay

## 2020-07-10 ENCOUNTER — Other Ambulatory Visit: Payer: Self-pay

## 2020-07-12 ENCOUNTER — Other Ambulatory Visit: Payer: Self-pay

## 2020-07-12 ENCOUNTER — Encounter: Payer: Self-pay | Admitting: Family Medicine

## 2020-07-12 ENCOUNTER — Ambulatory Visit: Payer: Medicare HMO | Attending: Family Medicine | Admitting: Family Medicine

## 2020-07-12 ENCOUNTER — Other Ambulatory Visit (HOSPITAL_COMMUNITY)
Admission: RE | Admit: 2020-07-12 | Discharge: 2020-07-12 | Disposition: A | Discharge status: Home / Self Care | Payer: Medicare HMO | Source: Ambulatory Visit | Attending: Family Medicine | Admitting: Family Medicine

## 2020-07-12 VITALS — BP 126/85 | HR 82 | Ht 67.0 in | Wt 246.0 lb

## 2020-07-12 DIAGNOSIS — Z1151 Encounter for screening for human papillomavirus (HPV): Secondary | ICD-10-CM | POA: Diagnosis not present

## 2020-07-12 DIAGNOSIS — Z131 Encounter for screening for diabetes mellitus: Secondary | ICD-10-CM

## 2020-07-12 DIAGNOSIS — Z124 Encounter for screening for malignant neoplasm of cervix: Secondary | ICD-10-CM

## 2020-07-12 DIAGNOSIS — Z0000 Encounter for general adult medical examination without abnormal findings: Secondary | ICD-10-CM

## 2020-07-12 DIAGNOSIS — I10 Essential (primary) hypertension: Secondary | ICD-10-CM

## 2020-07-12 DIAGNOSIS — Z01419 Encounter for gynecological examination (general) (routine) without abnormal findings: Secondary | ICD-10-CM | POA: Insufficient documentation

## 2020-07-12 DIAGNOSIS — Z23 Encounter for immunization: Secondary | ICD-10-CM

## 2020-07-12 DIAGNOSIS — Z1231 Encounter for screening mammogram for malignant neoplasm of breast: Secondary | ICD-10-CM

## 2020-07-12 DIAGNOSIS — M5431 Sciatica, right side: Secondary | ICD-10-CM | POA: Diagnosis not present

## 2020-07-12 MED ORDER — IBUPROFEN 600 MG PO TABS
ORAL_TABLET | Freq: Three times a day (TID) | ORAL | 1 refills | Status: DC | PRN
  Filled 2020-07-12: qty 60, 20d supply, fill #0
  Filled 2020-09-26: qty 60, 20d supply, fill #1

## 2020-07-12 NOTE — Progress Notes (Signed)
Subjective:   Kimberly Sosa is a 60 y.o. female who presents for Medicare Annual (Subsequent) preventive examination. She is due for a Pap smear and mammogram. Last colonoscopy was in 05/2020 with 5 sessile polyps removed pathology positive for tubular adenoma, hyperplastic polyps repeat recommended in 7 years. Smoked half a pack cig/day since she was 16 yrs   Her Sciatica of her right side bothers her and is uncontrolled on her current regimen.  She now has transportation and is agreeable to a referral for physical therapy. Doing well on her antihypertensives.  Review of Systems    General: negative for fever, weight loss, appetite change Eyes: no visual symptoms. ENT: no ear symptoms, no sinus tenderness, no nasal congestion or sore throat. Neck: no pain  Respiratory: no wheezing, shortness of breath, cough Cardiovascular: no chest pain, no dyspnea on exertion, no pedal edema, no orthopnea. Gastrointestinal: no abdominal pain, no diarrhea, no constipation Genito-Urinary: no urinary frequency, no dysuria, no polyuria. Hematologic: no bruising Endocrine: no cold or heat intolerance Neurological: no headaches, no seizures, no tremors Musculoskeletal: see HPI Skin: no pruritus, no rash. Psychological: no depression, no anxiety,         Objective:    Today's Vitals   07/12/20 1055 07/12/20 1056  BP: 126/85   Pulse: 82   SpO2: 99%   Weight: 246 lb (111.6 kg)   Height: _0  (1.702 m)   PainSc:  8    Body mass index is 38.53 kg/m.  Advanced Directives 07/12/2020 08/23/2019 12/19/2016 12/04/2016 08/26/2016 07/23/2016 07/08/2016  Does Patient Have a Medical Advance Directive? _1  No No  Would patient like information on creating a medical advance directive? - - - No - Patient declined - - No - Patient declined    Current Medications (verified) Outpatient Encounter Medications as of 07/12/2020  Medication Sig   Cholecalciferol (VITAMIN D3 PO) Take by mouth.    diclofenac Sodium (VOLTAREN) 1 % GEL Apply 2 g topically 4 (four) times daily.   diphenhydrAMINE (BENADRYL ALLERGY) 25 MG tablet Take 1 tablet (25 mg total) by mouth every 6 (six) hours as needed.   ibuprofen (ADVIL) 600 MG tablet TAKE 1 TABLET (600 MG TOTAL) BY MOUTH EVERY 8 (EIGHT) HOURS AS NEEDED.   lisinopril-hydrochlorothiazide (ZESTORETIC) 10-12.5 MG tablet TAKE 1 TABLET BY MOUTH DAILY.   loratadine (CLARITIN) 10 MG tablet Take 10 mg by mouth daily.   pravastatin (PRAVACHOL) 40 MG tablet TAKE 1 TABLET (40 MG TOTAL) BY MOUTH DAILY.   traMADol (ULTRAM) 50 MG tablet TAKE 1 TABLET (50 MG TOTAL) BY MOUTH EVERY 12 (TWELVE) HOURS AS NEEDED FOR SEVERE PAIN.   triamcinolone cream (KENALOG) 0.1 % Apply 1 application topically 2 (two) times daily.   No facility-administered encounter medications on file as of 07/12/2020.    Allergies (verified) Citrus, Hydrocodone, Metronidazole, Mobic [meloxicam], and Tetracyclines & related   History: Past Medical History:  Diagnosis Date   Active smoker    Allergy    Anemia    past hx    Back pain    Carpal tunnel syndrome    LEFT HAND   DJD (degenerative joint disease), lumbar    Hypercholesterolemia    Hypertension    Kidney stone    Pinched nerve in neck    Past Surgical History:  Procedure Laterality Date   APPENDECTOMY     COLONOSCOPY     POLYPECTOMY     ROTATOR CUFF REPAIR     TOTAL  HIP ARTHROPLASTY Left 04/08/2016   Procedure: LEFT TOTAL HIP ARTHROPLASTY ANTERIOR APPROACH;  Surgeon: Paralee Cancel, MD;  Location: [facility];  Service: Orthopedics;  Laterality: Left;  Requests 70 mins   TOTAL HIP ARTHROPLASTY Right 07/08/2016   Procedure: RIGHT TOTAL HIP ARTHROPLASTY ANTERIOR APPROACH;  Surgeon: Paralee Cancel, MD;  Location: [facility];  Service: Orthopedics;  Laterality: Right;   Family History  Problem Relation Age of Onset   Diabetes Mother    Hypertension Mother    Heart failure Mother    Prostate cancer Father    Diabetes Father    Diabetes  Sister    Diabetes Brother        3 brothers deceased - CHF   Healthy Sister    Breast cancer Maternal Grandmother    Colon cancer Neg Hx    Rectal cancer Neg Hx    Esophageal cancer Neg Hx    Liver cancer Neg Hx    Colon polyps Neg Hx    Social History   Socioeconomic History   Marital status: Single    Spouse name: Not on file   Number of children: 2   Years of education: Not on file   Highest education level: Not on file  Occupational History   Occupation: disabled  Tobacco Use   Smoking status: Every Day    Packs/day: 0.30    Pack years: 0.00    Types: Cigarettes    Last attempt to quit: 07/05/2016    Years since quitting: 4.0   Smokeless tobacco: Never   Tobacco comments:    1 pack every 3 days  Substance and Sexual Activity   Alcohol use: Yes    Comment: occ    Drug use: Yes    Frequency: 3.0 times per week    Types: Marijuana    Comment: occasionally   Sexual activity: Yes    Birth control/protection: Surgical  Other Topics Concern   Not on file  Social History Narrative   Not on file   Social Determinants of Health   Financial Resource Strain: Not on file  Food Insecurity: Not on file  Transportation Needs: Not on file  Physical Activity: Not on file  Stress: Not on file  Social Connections: Not on file    Tobacco Counseling Ready to quit: Not Answered Counseling given: Not Answered Tobacco comments: 1 pack every 3 days  Not ready to quit tobacco, counseling given.  Clinical Intake:  Pre-visit preparation completed: No  Pain : 0-10 Pain Score: 8  Pain Location: Leg Pain Orientation: Left, Right     Diabetes: No     Diabetic?No  Interpreter Needed?: No      Activities of Daily Living In your present state of health, do you have any difficulty performing the following activities: 07/12/2020  Hearing? N  Vision? N  Difficulty concentrating or making decisions? N  Walking or climbing stairs? N  Dressing or bathing? N  Doing  errands, shopping? N  Preparing Food and eating ? N  Using the Toilet? N  In the past six months, have you accidently leaked urine? N  Do you have problems with loss of bowel control? N  Managing your Medications? N  Managing your Finances? N  Housekeeping or managing your Housekeeping? N  Some recent data might be hidden    Patient Care Team: Charlott Rakes, MD as PCP - General (Family Medicine)  Indicate any recent Medical Services you may have received from other than [facility] providers in the  past year (date may be approximate).     Assessment:   This is a routine wellness examination for Kimberly Sosa.  Hearing/Vision screen Vision Screening   Right eye Left eye Both eyes  Without correction 20/50 20/40   With correction 20/13 20/15     Dietary issues and exercise activities discussed:     Goals Addressed   None    Depression Screen PHQ 2/9 Scores 07/12/2020 05/30/2020 05/23/2020 12/19/2019 08/23/2019 06/16/2019 12/20/2018  PHQ - 2 Score 0 0 0 0 0 0 0  PHQ- 9 Score - 0 - 0 0 0 0    Fall Risk Fall Risk  07/12/2020 05/30/2020 12/19/2019 08/23/2019 06/16/2019  Falls in the past year? 0 0 0 0 0  Number falls in past yr: 0 0 0 - -  Injury with Fall? 0 0 - - -  Risk for fall due to : - - - No Fall Risks -    FALL RISK PREVENTION PERTAINING TO THE HOME:  Any stairs in or around the home? No  If so, are there any without handrails? No  Home free of loose throw rugs in walkways, pet beds, electrical cords, etc? Yes  Adequate lighting in your home to reduce risk of falls? Yes   ASSISTIVE DEVICES UTILIZED TO PREVENT FALLS:  Life alert? No  Use of a cane, walker or w/c? No  Grab bars in the bathroom? Yes  Shower chair or bench in shower? No  Elevated toilet seat or a handicapped toilet? No   TIMED UP AND GO:  Was the test performed? Yes .  Length of time to ambulate 10 feet: 6 sec.   Gait steady and fast without use of assistive device   Cognitive Function:      Constitutional: normal appearing,  Eyes: PERRLA HEENT: Head is atraumatic, normal sinuses, normal oropharynx, normal appearing tonsils and palate, tympanic membrane is normal bilaterally. Neck: normal range of motion, no thyromegaly, no JVD Cardiovascular: normal rate and rhythm, normal heart sounds, no murmurs, rub or gallop, no pedal edema Respiratory: Normal breath sounds, clear to auscultation bilaterally, no wheezes, no rales, no rhonchi Abdomen: soft, not tender to palpation, normal bowel sounds, no enlarged organs Musculoskeletal: Positive straight leg raise on the right Genitourinary: External genitalia, vagina, cervix, adnexa-normal Skin: warm and dry, no lesions. Neurological: alert, oriented x3, cranial nerves I-XII grossly intact , normal motor strength, normal sensation. Psychological: normal mood.     Immunizations  There is no immunization history on file for this patient.  TDAP status: Due, Education has been provided regarding the importance of this vaccine. Advised may receive this vaccine at local pharmacy or Health Dept. Aware to provide a copy of the vaccination record if obtained from local pharmacy or Health Dept. Verbalized acceptance and understanding.  Flu Vaccine status: Up to date  Pneumococcal vaccine status: Completed during today's visit.    Qualifies for Shingles Vaccine? Yes   Zostavax completed No   Shingrix Completed?: No.    Education has been provided regarding the importance of this vaccine. Patient has been advised to call insurance company to determine out of pocket expense if they have not yet received this vaccine. Advised may also receive vaccine at local pharmacy or Health Dept. Verbalized acceptance and understanding.  Screening Tests Health Maintenance  Topic Date Due   COVID-19 Vaccine (1) Never done   Pneumococcal Vaccine 53-52 Years old (1 - PCV) Never done   TETANUS/TDAP  Never done   Zoster Vaccines- Shingrix (  1 of 2) Never done    PAP SMEAR-Modifier  08/27/2019   MAMMOGRAM  06/14/2020   INFLUENZA VACCINE  09/03/2020   COLONOSCOPY (Pts 45-74yr Insurance coverage will need to be confirmed)  05/26/2027   Hepatitis C Screening  Completed   HIV Screening  Completed   HPV VACCINES  Aged Out    Health Maintenance  Health Maintenance Due  Topic Date Due   COVID-19 Vaccine (1) Never done   Pneumococcal Vaccine 097641Years old (1 - PCV) Never done   TETANUS/TDAP  Never done   Zoster Vaccines- Shingrix (1 of 2) Never done   PAP SMEAR-Modifier  08/27/2019   MAMMOGRAM  06/14/2020    Colorectal cancer screening: Type of screening: Colonoscopy. Completed 05/2020. Repeat every 7 years  Mammogram status: Ordered today. Pt provided with contact info and advised to call to schedule appt.     Lung Cancer Screening: (Low Dose CT Chest recommended if Age 60-80years, 30 pack-year currently smoking OR have quit w/in 15years.) does qualify.   Lung Cancer Screening Referral: at next visit  Additional Screening:  Hepatitis C Screening: does not qualify;  Vision Screening: Recommended annual ophthalmology exams for early detection of glaucoma and other disorders of the eye. Is the patient up to date with their annual eye exam?  Yes  Who is the provider or what is the name of the office in which the patient attends annual eye exams? [facility] If pt is not established with a provider, would they like to be referred to a provider to establish care?  N/a .   Dental Screening: Recommended annual dental exams for proper oral hygiene  Community Resource Referral / Chronic Care Management: CRR required this visit?  No   CCM required this visit?  No      Plan:    1. Encounter for Medicare annual wellness exam Counseled on 150 minutes of exercise per week, healthy eating (including decreased daily intake of saturated fats, cholesterol, added sugars, sodium) routine healthcare maintenance. Advanced directive information  provided  2. Sciatica of right side Uncontrolled - ibuprofen (ADVIL) 600 MG tablet; TAKE 1 TABLET (600 MG TOTAL) BY MOUTH EVERY 8 (EIGHT) HOURS AS NEEDED.  Dispense: 60 tablet; Refill: 1 - Ambulatory referral to Physical Therapy  3. Screening for cervical cancer - Cytology - PAP()  4. Encounter for screening mammogram for malignant neoplasm of breast - MM 3D SCREEN BREAST BILATERAL; Future  5. Essential hypertension Controlled Low-sodium diet - Lipid panel - CMP14+EGFR  6. Screening for diabetes mellitus - Hemoglobin A1c  7. Need for pneumococcal vaccine - Pneumococcal conjugate vaccine 20-valent  I have personally reviewed and noted the following in the patient's chart:   Medical and social history Use of alcohol, tobacco or illicit drugs  Current medications and supplements including opioid prescriptions.  Functional ability and status Nutritional status Physical activity Advanced directives List of other physicians Hospitalizations, surgeries, and ER visits in previous 12 months Vitals Screenings to include cognitive, depression, and falls Referrals and appointments  In addition, I have reviewed and discussed with patient certain preventive protocols, quality metrics, and best practice recommendations. A written personalized care plan for preventive services as well as general preventive health recommendations were provided to patient.     ECharlott Rakes MD   07/12/2020

## 2020-07-12 NOTE — Progress Notes (Signed)
Medicare wellness/pap

## 2020-07-13 ENCOUNTER — Encounter: Payer: Self-pay | Admitting: Family Medicine

## 2020-07-13 ENCOUNTER — Telehealth: Payer: Self-pay

## 2020-07-13 LAB — CMP14+EGFR
ALT: 13 IU/L (ref 0–32)
AST: 14 IU/L (ref 0–40)
Albumin/Globulin Ratio: 1.9 (ref 1.2–2.2)
Albumin: 4.5 g/dL (ref 3.8–4.9)
Alkaline Phosphatase: 119 IU/L (ref 44–121)
BUN/Creatinine Ratio: 18 (ref 12–28)
BUN: 19 mg/dL (ref 8–27)
Bilirubin Total: <0.2 mg/dL (ref 0.0–1.2)
CO2: 21 mmol/L (ref 20–29)
Calcium: 9.8 mg/dL (ref 8.7–10.3)
Chloride: 104 mmol/L (ref 96–106)
Creatinine, Ser: 1.08 mg/dL — ABNORMAL HIGH (ref 0.57–1.00)
Globulin, Total: 2.4 g/dL (ref 1.5–4.5)
Glucose: 88 mg/dL (ref 65–99)
Potassium: 4.9 mmol/L (ref 3.5–5.2)
Sodium: 142 mmol/L (ref 134–144)
Total Protein: 6.9 g/dL (ref 6.0–8.5)
eGFR: 59 mL/min/{1.73_m2} — ABNORMAL LOW (ref >59)

## 2020-07-13 LAB — HEMOGLOBIN A1C
Est. average glucose Bld gHb Est-mCnc: 131 mg/dL
Hgb A1c MFr Bld: 6.2 % — ABNORMAL HIGH (ref 4.8–5.6)

## 2020-07-13 LAB — LIPID PANEL
Chol/HDL Ratio: 2.7 ratio (ref 0.0–4.4)
Cholesterol, Total: 195 mg/dL (ref 100–199)
HDL: 73 mg/dL (ref >39)
LDL Chol Calc (NIH): 111 mg/dL — ABNORMAL HIGH (ref 0–99)
Triglycerides: 60 mg/dL (ref 0–149)
VLDL Cholesterol Cal: 11 mg/dL (ref 5–40)

## 2020-07-13 NOTE — Patient Instructions (Signed)
  Ms. Biller , Thank you for taking time to come for your Medicare Wellness Visit. I appreciate your ongoing commitment to your health goals. Please review the following plan we discussed and let me know if I can assist you in the future.   These are the goals we discussed:  Goals   None     This is a list of the screening recommended for you and due dates:  Health Maintenance  Topic Date Due   COVID-19 Vaccine (1) Never done   Pneumococcal Vaccination (1 - PCV) Never done   Tetanus Vaccine  Never done   Zoster (Shingles) Vaccine (1 of 2) Never done   Pap Smear  08/27/2019   Mammogram  06/14/2020   Flu Shot  09/03/2020   Colon Cancer Screening  05/26/2027   Hepatitis C Screening: USPSTF Recommendation to screen - Ages 18-79 yo.  Completed   HIV Screening  Completed   HPV Vaccine  Aged Out

## 2020-07-13 NOTE — Telephone Encounter (Signed)
Patient name and DOB has been verified Patient was informed of lab results. Patient had no questions.  

## 2020-07-13 NOTE — Telephone Encounter (Signed)
-----   Message from Charlott Rakes, MD sent at 07/13/2020  9:47 AM EDT ----- Please inform her that her labs revealed prediabetes with A1c of 6.2 which has increased from 5.9.  Please advised to work on reducing carbs and sweets, weight loss.  Cholesterol is normal.

## 2020-07-16 LAB — CYTOLOGY - PAP
Comment: NEGATIVE
Diagnosis: NEGATIVE
High risk HPV: NEGATIVE

## 2020-07-17 ENCOUNTER — Other Ambulatory Visit: Payer: Self-pay

## 2020-08-30 ENCOUNTER — Other Ambulatory Visit: Payer: Self-pay | Admitting: Family Medicine

## 2020-08-30 DIAGNOSIS — Z1231 Encounter for screening mammogram for malignant neoplasm of breast: Secondary | ICD-10-CM

## 2020-09-26 ENCOUNTER — Other Ambulatory Visit: Payer: Self-pay

## 2020-10-01 ENCOUNTER — Other Ambulatory Visit: Payer: Self-pay | Admitting: Family Medicine

## 2020-10-01 DIAGNOSIS — N63 Unspecified lump in unspecified breast: Secondary | ICD-10-CM

## 2020-10-04 ENCOUNTER — Ambulatory Visit
Admission: RE | Admit: 2020-10-04 | Discharge: 2020-10-04 | Disposition: A | Discharge status: Home / Self Care | Payer: Medicare HMO | Source: Ambulatory Visit | Attending: Family Medicine | Admitting: Family Medicine

## 2020-10-04 ENCOUNTER — Other Ambulatory Visit: Payer: Self-pay

## 2020-10-04 DIAGNOSIS — N63 Unspecified lump in unspecified breast: Secondary | ICD-10-CM

## 2020-10-04 DIAGNOSIS — R922 Inconclusive mammogram: Secondary | ICD-10-CM | POA: Diagnosis not present

## 2020-10-05 ENCOUNTER — Telehealth: Payer: Self-pay

## 2020-10-05 NOTE — Telephone Encounter (Signed)
Patient name and DOB has been verified Patient was informed of lab results. Patient had no questions.  

## 2020-10-05 NOTE — Telephone Encounter (Signed)
-----   Message from Charlott Rakes, MD sent at 10/05/2020 10:53 AM EDT ----- Mammogram reveals no evidence of cancer.

## 2020-10-12 ENCOUNTER — Telehealth: Payer: Self-pay

## 2020-10-12 NOTE — Telephone Encounter (Signed)
Patient name and DOB has been verified Patient was informed of lab results. Patient had no questions.  

## 2020-10-12 NOTE — Telephone Encounter (Signed)
-----   Message from Charlott Rakes, MD sent at 10/10/2020  3:47 PM EDT ----- Please inform her that her ultrasound is negative

## 2020-11-21 DIAGNOSIS — H524 Presbyopia: Secondary | ICD-10-CM | POA: Diagnosis not present

## 2020-11-21 DIAGNOSIS — Z0100 Encounter for examination of eyes and vision without abnormal findings: Secondary | ICD-10-CM | POA: Diagnosis not present

## 2020-11-23 ENCOUNTER — Ambulatory Visit: Payer: Self-pay

## 2020-11-23 NOTE — Telephone Encounter (Signed)
Pt. Reports she started having left arm and hand numbness 2 weeks ago. States she has a history of "rotator cuff problems and carpel tunnel surgery in 2018." "I don't know if it's related to these." Requests appointment today. No availability. Would like to be worked in. Child psychotherapist see anyone." Please advise pt.         Reason for Disposition  [1] Tingling (e.g., pins and needles) of the face, arm / hand, or leg / foot on one side of the body AND [2] present now (Exceptions: chronic/recurrent symptom lasting > 4 weeks or tingling from known cause, such as: bumped elbow, carpal tunnel syndrome, pinched nerve, frostbite)  Answer Assessment - Initial Assessment Questions 1. SYMPTOM: "What is the main symptom you are concerned about?" (e.g., weakness, numbness)     Numbness left arm and hand 2. ONSET: "When did this start?" (minutes, hours, days; while sleeping)     2 weeks ago 3. LAST NORMAL: "When was the last time you (the patient) were normal (no symptoms)?"     2 weeks ago 4. PATTERN "Does this come and go, or has it been constant since it started?"  "Is it present now?"     Constant now 5. CARDIAC SYMPTOMS: "Have you had any of the following symptoms: chest pain, difficulty breathing, palpitations?"     No 6. NEUROLOGIC SYMPTOMS: "Have you had any of the following symptoms: headache, dizziness, vision loss, double vision, changes in speech, unsteady on your feet?"     No 7. OTHER SYMPTOMS: "Do you have any other symptoms?"     Pain 8. PREGNANCY: "Is there any chance you are pregnant?" "When was your last menstrual period?"     No  Protocols used: Neurologic Deficit-A-AH

## 2020-11-23 NOTE — Telephone Encounter (Signed)
Pt. Reports she started having left arm and hand numbness 2 weeks ago. States she has a history of "rotator cuff problems and carpel tunnel surgery in 2018." "I don't know if it's related to these." Requests appointment today. No availability. Would like to be worked in. Child psychotherapist see anyone." Please advise pt.     Reason for Disposition  [1] Tingling (e.g., pins and needles) of the face, arm / hand, or leg / foot on one side of the body AND [2] present now (Exceptions: chronic/recurrent symptom lasting > 4 weeks or tingling from known cause, such as: bumped elbow, carpal tunnel syndrome, pinched nerve, frostbite)  Answer Assessment - Initial Assessment Questions 1. SYMPTOM: "What is the main symptom you are concerned about?" (e.g., weakness, numbness)     Numbness left arm and hand 2. ONSET: "When did this start?" (minutes, hours, days; while sleeping)     2 weeks ago 3. LAST NORMAL: "When was the last time you (the patient) were normal (no symptoms)?"     2 weeks ago 4. PATTERN "Does this come and go, or has it been constant since it started?"  "Is it present now?"     Constant now 5. CARDIAC SYMPTOMS: "Have you had any of the following symptoms: chest pain, difficulty breathing, palpitations?"     No 6. NEUROLOGIC SYMPTOMS: "Have you had any of the following symptoms: headache, dizziness, vision loss, double vision, changes in speech, unsteady on your feet?"     No 7. OTHER SYMPTOMS: "Do you have any other symptoms?"     Pain 8. PREGNANCY: "Is there any chance you are pregnant?" "When was your last menstrual period?"     No  Protocols used: Neurologic Deficit-A-AH

## 2020-11-28 NOTE — Telephone Encounter (Signed)
Pt was called and she states that she has taken some medication and her arm is no longer hurting.

## 2020-12-24 ENCOUNTER — Other Ambulatory Visit: Payer: Self-pay | Admitting: Family Medicine

## 2020-12-24 ENCOUNTER — Other Ambulatory Visit: Payer: Self-pay

## 2020-12-24 DIAGNOSIS — M5431 Sciatica, right side: Secondary | ICD-10-CM

## 2020-12-24 DIAGNOSIS — M47816 Spondylosis without myelopathy or radiculopathy, lumbar region: Secondary | ICD-10-CM

## 2020-12-24 DIAGNOSIS — M62838 Other muscle spasm: Secondary | ICD-10-CM

## 2020-12-24 DIAGNOSIS — E7800 Pure hypercholesterolemia, unspecified: Secondary | ICD-10-CM

## 2020-12-24 MED ORDER — PRAVASTATIN SODIUM 40 MG PO TABS
ORAL_TABLET | Freq: Every day | ORAL | 1 refills | Status: DC
  Filled 2020-12-24 – 2021-03-22 (×2): qty 90, 90d supply, fill #0

## 2020-12-24 MED ORDER — IBUPROFEN 600 MG PO TABS
ORAL_TABLET | Freq: Three times a day (TID) | ORAL | 1 refills | Status: DC | PRN
  Filled 2020-12-24 – 2021-03-27 (×2): qty 60, 20d supply, fill #0

## 2020-12-24 NOTE — Telephone Encounter (Signed)
Requested medication (s) are due for refill today:   No for Flexeril,  Yes for Ibuprofen  Requested medication (s) are on the active medication list:   No for Flexeril,  Yes for ibuprofen  Future visit scheduled:   Yes 01/14/2021 with [place]   Last ordered: Ibuprofen 07/12/2020 - 07/12/2021 #60, 1;   Flexeril is not on the medication list and is non delegated.     Requested Prescriptions  Pending Prescriptions Disp Refills   cyclobenzaprine (FLEXERIL) 10 MG tablet 60 tablet 2    Sig: TAKE 1 TABLET (10 MG TOTAL) BY MOUTH 2 (TWO) TIMES DAILY AS NEEDED FOR MUSCLE SPASMS.     Not Delegated - Analgesics:  Muscle Relaxants Failed - 12/24/2020  9:08 AM      Failed - This refill cannot be delegated      Passed - Valid encounter within last 6 months    Recent Outpatient Visits           5 months ago Sciatica of right side   [place], [place], MD   6 months ago Obesity (BMI 35.0-39.9 without comorbidity)   [place], [place]obong, MD   1 year ago Muscle spasm   [place], Enobong, MD   1 year ago Impingement syndrome of right shoulder   [place], En[place]ong, MD   1 year ago Other fatigue   [place], Charlane Ferretti, MD       Future Appointments             In 3 weeks Charlott Rakes, MD [place]             ibuprofen (ADVIL) 600 MG tablet 60 tablet 1    Sig: TAKE 1 TABLET (600 MG TOTAL) BY MOUTH EVERY 8 (EIGHT) HOURS AS NEEDED.     Analgesics:  NSAIDS Failed - 12/24/2020  9:08 AM      Failed - Cr in normal range and within 360 days    Creat  Date Value Ref Range Status  03/20/2016 0.83 0.50 - 1.05 mg/dL Final    Comment:      For patients > or = 60 years of age: The upper reference limit for Creatinine is approximately 13% higher for people identified  as African-American.      Creatinine, Ser  Date Value Ref Range Status  07/12/2020 1.08 (H) 0.57 - 1.00 mg/dL Final   Creatinine, Urine  Date Value Ref Range Status  03/20/2016 150 20 - 320 mg/dL Final          Failed - HGB in normal range and within 360 days    Hemoglobin  Date Value Ref Range Status  09/16/2018 13.0 11.1 - 15.9 g/dL Final          Passed - Patient is not pregnant      Passed - Valid encounter within last 12 months    Recent Outpatient Visits           5 months ago Sciatica of right side   [place], [place], MD   6 months ago Obesity (BMI 35.0-39.9 without comorbidity)   [place], Enobong, MD   1 year ago Muscle spasm   [place], En[place]ong, MD   1 year ago Impingement syndrome  of right shoulder   [place], [place]ong, MD   1 year ago Other fatigue   [place], Enobong, MD       Future Appointments             In 3 weeks Charlott Rakes, MD [place]

## 2020-12-25 ENCOUNTER — Other Ambulatory Visit: Payer: Self-pay

## 2020-12-25 MED ORDER — CYCLOBENZAPRINE HCL 10 MG PO TABS
ORAL_TABLET | Freq: Two times a day (BID) | ORAL | 2 refills | Status: AC | PRN
  Filled 2020-12-25: qty 60, 30d supply, fill #0

## 2020-12-31 ENCOUNTER — Other Ambulatory Visit: Payer: Self-pay

## 2020-12-31 ENCOUNTER — Other Ambulatory Visit: Payer: Self-pay | Admitting: Family Medicine

## 2020-12-31 DIAGNOSIS — I10 Essential (primary) hypertension: Secondary | ICD-10-CM

## 2021-01-01 MED ORDER — LISINOPRIL-HYDROCHLOROTHIAZIDE 10-12.5 MG PO TABS
1.0000 | ORAL_TABLET | Freq: Every day | ORAL | 0 refills | Status: DC
  Filled 2021-01-01 – 2021-01-10 (×2): qty 90, 90d supply, fill #0

## 2021-01-01 NOTE — Telephone Encounter (Signed)
Requested Prescriptions  Pending Prescriptions Disp Refills  . lisinopril-hydrochlorothiazide (ZESTORETIC) 10-12.5 MG tablet 90 tablet 0    Sig: TAKE 1 TABLET BY MOUTH DAILY.     Cardiovascular:  ACEI + Diuretic Combos Failed - 12/31/2020 12:35 PM      Failed - Cr in normal range and within 180 days    Creat  Date Value Ref Range Status  03/20/2016 0.83 0.50 - 1.05 mg/dL Final    Comment:      For patients > or = 60 years of age: The upper reference limit for Creatinine is approximately 13% higher for people identified as African-American.      Creatinine, Ser  Date Value Ref Range Status  07/12/2020 1.08 (H) 0.57 - 1.00 mg/dL Final   Creatinine, Urine  Date Value Ref Range Status  03/20/2016 150 20 - 320 mg/dL Final         Passed - Na in normal range and within 180 days    Sodium  Date Value Ref Range Status  07/12/2020 142 134 - 144 mmol/L Final         Passed - K in normal range and within 180 days    Potassium  Date Value Ref Range Status  07/12/2020 4.9 3.5 - 5.2 mmol/L Final         Passed - Ca in normal range and within 180 days    Calcium  Date Value Ref Range Status  07/12/2020 9.8 8.7 - 10.3 mg/dL Final         Passed - Patient is not pregnant      Passed - Last BP in normal range    BP Readings from Last 1 Encounters:  07/12/20 126/85         Passed - Valid encounter within last 6 months    Recent Outpatient Visits          5 months ago Sciatica of right side   [place], [place], MD   7 months ago Obesity (BMI 35.0-39.9 without comorbidity)   [place], Enobong, MD   1 year ago Muscle spasm   [place], Enobong, MD   1 year ago Impingement syndrome of right shoulder   [place], Enobong, MD   1 year ago Other fatigue   [place], Enobong, MD       Future Appointments            In 1 week Charlott Rakes, MD [place]

## 2021-01-02 ENCOUNTER — Other Ambulatory Visit: Payer: Self-pay

## 2021-01-09 ENCOUNTER — Other Ambulatory Visit: Payer: Self-pay

## 2021-01-10 ENCOUNTER — Other Ambulatory Visit: Payer: Self-pay

## 2021-01-11 ENCOUNTER — Other Ambulatory Visit: Payer: Self-pay

## 2021-01-14 ENCOUNTER — Encounter: Payer: Self-pay | Admitting: Family Medicine

## 2021-01-14 ENCOUNTER — Other Ambulatory Visit: Payer: Self-pay

## 2021-01-14 ENCOUNTER — Ambulatory Visit: Payer: Medicare HMO | Attending: Family Medicine | Admitting: Family Medicine

## 2021-01-14 VITALS — BP 147/85 | HR 80 | Ht 67.0 in | Wt 258.0 lb

## 2021-01-14 DIAGNOSIS — R7303 Prediabetes: Secondary | ICD-10-CM

## 2021-01-14 DIAGNOSIS — I10 Essential (primary) hypertension: Secondary | ICD-10-CM

## 2021-01-14 DIAGNOSIS — M47816 Spondylosis without myelopathy or radiculopathy, lumbar region: Secondary | ICD-10-CM

## 2021-01-14 DIAGNOSIS — F17210 Nicotine dependence, cigarettes, uncomplicated: Secondary | ICD-10-CM | POA: Diagnosis not present

## 2021-01-14 DIAGNOSIS — F17200 Nicotine dependence, unspecified, uncomplicated: Secondary | ICD-10-CM

## 2021-01-14 LAB — POCT GLYCOSYLATED HEMOGLOBIN (HGB A1C): HbA1c, POC (controlled diabetic range): 6.1 % (ref 0.0–7.0)

## 2021-01-14 MED ORDER — LISINOPRIL-HYDROCHLOROTHIAZIDE 10-12.5 MG PO TABS
1.0000 | ORAL_TABLET | Freq: Every day | ORAL | 1 refills | Status: DC
  Filled 2021-01-14 – 2021-03-26 (×3): qty 90, 90d supply, fill #0
  Filled 2021-07-04: qty 90, 90d supply, fill #1

## 2021-01-14 MED ORDER — NICOTINE 14 MG/24HR TD PT24
14.0000 mg | MEDICATED_PATCH | Freq: Every day | TRANSDERMAL | 1 refills | Status: DC
Start: 2021-01-14 — End: 2021-07-15
  Filled 2021-01-14: qty 28, 28d supply, fill #0

## 2021-01-14 NOTE — Progress Notes (Signed)
Referral for back pain and spasms.  Discuss quitting smoking.

## 2021-01-14 NOTE — Progress Notes (Signed)
Subjective:  Patient ID: Kimberly Sosa, female    DOB: 1960-07-30  Age: 60 y.o. MRN: [id number]  CC: Hypertension   HPI [name] is a 60 y.o. year old female with a history of hypertension, hypercholesterolemia, degenerative disease of the lumbar spine here for chronic disease management  Interval History: Her thoracolumbar region has been hurting and sometimes spasms while she is washing dishes and she has to go sit. She uses her muscle relaxant along with her Ibuprofen and a heating pad.  Would like to be referred for physical therapy. She would like help quitting smoking. She smokes half a pack/day.  She smokes to help her relieve stress that she is currently living with her daughter who is pregnant and this causes her anxiety levels to go up. She smokes THC as it helps with her muscle spasms  She is yet to take her antihypertensive this morning as she is fasting anticipation of labs.  Endorses compliance prior to now with her antihypertensive and statin. Past Medical History:  Diagnosis Date   Active smoker    Allergy    Anemia    past hx    Back pain    Carpal tunnel syndrome    LEFT HAND   DJD (degenerative joint disease), lumbar    Hypercholesterolemia    Hypertension    Kidney stone    Pinched nerve in neck     Past Surgical History:  Procedure Laterality Date   APPENDECTOMY     BREAST BIOPSY Right 07/02/2018   COLONOSCOPY     POLYPECTOMY     ROTATOR CUFF REPAIR     TOTAL HIP ARTHROPLASTY Left 04/08/2016   Procedure: LEFT TOTAL HIP ARTHROPLASTY ANTERIOR APPROACH;  Surgeon: Paralee Cancel, MD;  Location: [facility];  Service: Orthopedics;  Laterality: Left;  Requests 70 mins   TOTAL HIP ARTHROPLASTY Right 07/08/2016   Procedure: RIGHT TOTAL HIP ARTHROPLASTY ANTERIOR APPROACH;  Surgeon: Paralee Cancel, MD;  Location: [facility];  Service: Orthopedics;  Laterality: Right;    Family History  Problem Relation Age of Onset   Diabetes Mother    Hypertension  Mother    Heart failure Mother    Prostate cancer Father    Diabetes Father    Diabetes Sister    Diabetes Brother        3 brothers deceased - CHF   Healthy Sister    Breast cancer Maternal Grandmother    Colon cancer Neg Hx    Rectal cancer Neg Hx    Esophageal cancer Neg Hx    Liver cancer Neg Hx    Colon polyps Neg Hx     Allergies  Allergen Reactions   Citrus Itching    Peaches  Oranges    Hydrocodone Other (See Comments)    Causes Headaches   Metronidazole Itching   Mobic [Meloxicam] Other (See Comments)    Causes Headaches   Tetracyclines & Related Swelling    All over body swelling    Outpatient Medications Prior to Visit  Medication Sig Dispense Refill   Cholecalciferol (VITAMIN D3 PO) Take by mouth.     cyclobenzaprine (FLEXERIL) 10 MG tablet TAKE 1 TABLET (10 MG TOTAL) BY MOUTH 2 (TWO) TIMES DAILY AS NEEDED FOR MUSCLE SPASMS. 60 tablet 2   diclofenac Sodium (VOLTAREN) 1 % GEL Apply 2 g topically 4 (four) times daily. 100 g 0   ibuprofen (ADVIL) 600 MG tablet TAKE 1 TABLET (600 MG TOTAL) BY MOUTH EVERY 8 (EIGHT) HOURS  AS NEEDED. 60 tablet 1   pravastatin (PRAVACHOL) 40 MG tablet TAKE 1 TABLET (40 MG TOTAL) BY MOUTH DAILY. 90 tablet 1   triamcinolone cream (KENALOG) 0.1 % Apply 1 application topically 2 (two) times daily. 30 g 1   lisinopril-hydrochlorothiazide (ZESTORETIC) 10-12.5 MG tablet TAKE 1 TABLET BY MOUTH DAILY. 90 tablet 0   diphenhydrAMINE (BENADRYL ALLERGY) 25 MG tablet Take 1 tablet (25 mg total) by mouth every 6 (six) hours as needed. (Patient not taking: Reported on 01/14/2021) 20 tablet 0   loratadine (CLARITIN) 10 MG tablet Take 10 mg by mouth daily. (Patient not taking: Reported on 01/14/2021)     No facility-administered medications prior to visit.     ROS Review of Systems  Constitutional:  Negative for activity change, appetite change and fatigue.  HENT:  Negative for congestion, sinus pressure and sore throat.   Eyes:  Negative for  visual disturbance.  Respiratory:  Negative for cough, chest tightness, shortness of breath and wheezing.   Cardiovascular:  Negative for chest pain and palpitations.  Gastrointestinal:  Negative for abdominal distention, abdominal pain and constipation.  Endocrine: Negative for polydipsia.  Genitourinary:  Negative for dysuria and frequency.  Musculoskeletal:  Positive for back pain. Negative for arthralgias.  Skin:  Negative for rash.  Neurological:  Negative for tremors, light-headedness and numbness.  Hematological:  Does not bruise/bleed easily.  Psychiatric/Behavioral:  Negative for agitation and behavioral problems.    Objective:  BP (!) 147/85 Comment: no meds this morning  Pulse 80   Ht 5' 7" (1.702 m)   Wt 258 lb (117 kg)   SpO2 100%   BMI 40.41 kg/m   BP/Weight 01/14/2021 07/12/2020 08/30/6182  Systolic BP 859 276 394  Diastolic BP 85 85 80  Wt. (Lbs) 258 246 245  BMI 40.41 38.53 38.37      Physical Exam Constitutional:      Appearance: She is well-developed.  Cardiovascular:     Rate and Rhythm: Normal rate.     Heart sounds: Normal heart sounds. No murmur heard. Pulmonary:     Effort: Pulmonary effort is normal.     Breath sounds: Normal breath sounds. No wheezing or rales.  Chest:     Chest wall: No tenderness.  Abdominal:     General: Bowel sounds are normal. There is no distension.     Palpations: Abdomen is soft. There is no mass.     Tenderness: There is no abdominal tenderness.  Musculoskeletal:        General: No tenderness. Normal range of motion.     Right lower leg: No edema.     Left lower leg: No edema.     Comments: Negative straight leg raise bilaterally  Neurological:     Mental Status: She is alert and oriented to person, place, and time.  Psychiatric:        Mood and Affect: Mood normal.    CMP Latest Ref Rng & Units 07/12/2020 12/19/2019 08/23/2019  Glucose 65 - 99 mg/dL 88 94 83  BUN 8 - 27 mg/dL _0 Creatinine 0.57 - 1.00  mg/dL 1.08(H) 0.91 1.02(H)  Sodium 134 - 144 mmol/L 142 140 144  Potassium 3.5 - 5.2 mmol/L 4.9 4.1 3.9  Chloride 96 - 106 mmol/L 104 105 105  CO2 20 - 29 mmol/L _1 Calcium 8.7 - 10.3 mg/dL 9.8 9.9 10.0  Total Protein 6.0 - 8.5 g/dL 6.9 7.0 6.9  Total Bilirubin 0.0 - 1.2  mg/dL <0.2 <0.2 <0.2  Alkaline Phos 44 - 121 IU/L 119 123(H) 117  AST 0 - 40 IU/L _0 ALT 0 - 32 IU/L _1 Lipid Panel     Component Value Date/Time   CHOL 195 07/12/2020 1158   TRIG 60 07/12/2020 1158   HDL 73 07/12/2020 1158   CHOLHDL 2.7 07/12/2020 1158   LDLCALC 111 (H) 07/12/2020 1158    CBC    Component Value Date/Time   WBC 13.8 (H) 09/16/2018 1017   WBC 14.2 (H) 07/09/2016 0509   RBC 4.43 09/16/2018 1017   RBC 3.10 (L) 07/09/2016 0509   HGB 13.0 09/16/2018 1017   HCT 39.7 09/16/2018 1017   PLT 418 09/16/2018 1017   MCV 90 09/16/2018 1017   MCH 29.3 09/16/2018 1017   MCH 29.0 07/09/2016 0509   MCHC 32.7 09/16/2018 1017   MCHC 33.0 07/09/2016 0509   RDW 13.3 09/16/2018 1017   LYMPHSABS 5.3 (H) 09/16/2018 1017   EOSABS 0.2 09/16/2018 1017   BASOSABS 0.1 09/16/2018 1017    Lab Results  Component Value Date   HGBA1C 6.1 01/14/2021    Assessment & Plan:  1. Prediabetes Controlled with A1c of 6.1 Continue with lifestyle modifications to prevent progression to type 2 diabetes mellitus - POCT glycosylated hemoglobin (Hb A1C)  2. Tobacco use disorder Smoking cessation support: smoking cessation hotline: 1-800-QUIT-NOW.  Smoking cessation classes are available through [facility]. Call [phone] or visit our website at [email or web address]  Spent 3 minutes counseling on dangers of tobacco use and benefits of quitting, offered pharmacological intervention to aid quitting and patient is ready to quit.  - nicotine (NICODERM CQ) 14 mg/24hr patch; Place 1 patch (14 mg total) onto the skin daily. Then switch to 7 mg / 24 hours  Dispense: 28 patch;  Refill: 1  3. Essential hypertension Slightly above goal No regimen change today as she is yet to take antihypertensive Counseled on blood pressure goal of less than 130/80, low-sodium, DASH diet, medication compliance, 150 minutes of moderate intensity exercise per week. Discussed medication compliance, adverse effects. - lisinopril-hydrochlorothiazide (ZESTORETIC) 10-12.5 MG tablet; TAKE 1 TABLET BY MOUTH DAILY.  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 Counseled on evidence of improvement in pain control with regards to yoga, water aerobics, massage, home physical therapy, exercise as tolerated. - Ambulatory referral to Physical Therapy   Meds ordered this encounter  Medications   nicotine (NICODERM CQ) 14 mg/24hr patch    Sig: Place 1 patch (14 mg total) onto the skin daily. Then switch to 7 mg / 24 hours    Dispense:  28 patch    Refill:  1   lisinopril-hydrochlorothiazide (ZESTORETIC) 10-12.5 MG tablet    Sig: TAKE 1 TABLET BY MOUTH DAILY.    Dispense:  90 tablet    Refill:  1     Follow-up: Return in about 6 months (around 07/15/2021) for Chronic medical conditions.       Charlott Rakes, MD, FAAFP. [facility] [place], [place]   01/14/2021, 12:45 PM

## 2021-01-14 NOTE — Patient Instructions (Signed)

## 2021-01-15 LAB — LP+NON-HDL CHOLESTEROL
Cholesterol, Total: 186 mg/dL (ref 100–199)
HDL: 61 mg/dL (ref >39)
LDL Chol Calc (NIH): 112 mg/dL — ABNORMAL HIGH (ref 0–99)
Total Non-HDL-Chol (LDL+VLDL): 125 mg/dL (ref 0–129)
Triglycerides: 70 mg/dL (ref 0–149)
VLDL Cholesterol Cal: 13 mg/dL (ref 5–40)

## 2021-01-15 LAB — CMP14+EGFR
ALT: 16 IU/L (ref 0–32)
AST: 17 IU/L (ref 0–40)
Albumin/Globulin Ratio: 2 (ref 1.2–2.2)
Albumin: 4.3 g/dL (ref 3.8–4.9)
Alkaline Phosphatase: 113 IU/L (ref 44–121)
BUN/Creatinine Ratio: 20 (ref 12–28)
BUN: 20 mg/dL (ref 8–27)
Bilirubin Total: 0.2 mg/dL (ref 0.0–1.2)
CO2: 22 mmol/L (ref 20–29)
Calcium: 10 mg/dL (ref 8.7–10.3)
Chloride: 104 mmol/L (ref 96–106)
Creatinine, Ser: 1.02 mg/dL — ABNORMAL HIGH (ref 0.57–1.00)
Globulin, Total: 2.1 g/dL (ref 1.5–4.5)
Glucose: 94 mg/dL (ref 70–99)
Potassium: 4.5 mmol/L (ref 3.5–5.2)
Sodium: 142 mmol/L (ref 134–144)
Total Protein: 6.4 g/dL (ref 6.0–8.5)
eGFR: 63 mL/min/{1.73_m2} (ref >59)

## 2021-01-18 ENCOUNTER — Telehealth: Payer: Self-pay

## 2021-01-18 NOTE — Telephone Encounter (Signed)
-----   Message from Charlott Rakes, MD sent at 01/15/2021  9:47 AM EST ----- Please inform the patient that labs are normal. Thank you.

## 2021-01-18 NOTE — Telephone Encounter (Signed)
Patient name and DOB has been verified Patient was informed of lab results. Patient had no questions.  

## 2021-01-24 ENCOUNTER — Ambulatory Visit: Payer: Medicare HMO | Attending: Family Medicine

## 2021-01-24 ENCOUNTER — Other Ambulatory Visit: Payer: Self-pay

## 2021-01-24 DIAGNOSIS — M47816 Spondylosis without myelopathy or radiculopathy, lumbar region: Secondary | ICD-10-CM | POA: Insufficient documentation

## 2021-01-24 DIAGNOSIS — R2689 Other abnormalities of gait and mobility: Secondary | ICD-10-CM | POA: Diagnosis not present

## 2021-01-24 DIAGNOSIS — G8929 Other chronic pain: Secondary | ICD-10-CM | POA: Insufficient documentation

## 2021-01-24 DIAGNOSIS — M545 Low back pain, unspecified: Secondary | ICD-10-CM | POA: Diagnosis not present

## 2021-01-24 DIAGNOSIS — M6281 Muscle weakness (generalized): Secondary | ICD-10-CM | POA: Insufficient documentation
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
## 2021-01-24 NOTE — Addendum Note (Signed)
Addended by: Ward Chatters on: 01/24/2021 05:36 PM   Modules accepted: Orders
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
## 2021-02-06 ENCOUNTER — Ambulatory Visit: Payer: Medicare HMO | Attending: Family Medicine

## 2021-02-06 ENCOUNTER — Telehealth: Payer: Self-pay

## 2021-02-06 NOTE — Telephone Encounter (Signed)
Attempted to contact regarding missed visit on 02/06/21. No answer and voicemail full.  Patient has future appointments scheduled, will follow current attendance policy.  Ward Chatters, PT 02/06/21 4:46 PM

## 2021-02-12 ENCOUNTER — Ambulatory Visit: Payer: Medicare HMO | Admitting: Physical Therapy

## 2021-02-14 ENCOUNTER — Ambulatory Visit: Payer: Medicare HMO

## 2021-02-19 ENCOUNTER — Ambulatory Visit: Payer: Medicare HMO | Admitting: Physical Therapy

## 2021-02-21 ENCOUNTER — Encounter: Payer: Medicare HMO | Admitting: Physical Therapy

## 2021-02-26 ENCOUNTER — Ambulatory Visit: Payer: Medicare HMO

## 2021-02-28 ENCOUNTER — Encounter: Payer: Medicare HMO | Admitting: Physical Therapy

## 2021-03-22 ENCOUNTER — Other Ambulatory Visit: Payer: Self-pay

## 2021-03-26 ENCOUNTER — Other Ambulatory Visit: Payer: Self-pay

## 2021-03-27 ENCOUNTER — Other Ambulatory Visit: Payer: Self-pay

## 2021-03-29 ENCOUNTER — Other Ambulatory Visit: Payer: Self-pay

## 2021-06-10 ENCOUNTER — Other Ambulatory Visit: Payer: Self-pay | Admitting: Family Medicine

## 2021-06-10 ENCOUNTER — Other Ambulatory Visit: Payer: Self-pay

## 2021-06-10 DIAGNOSIS — M5431 Sciatica, right side: Secondary | ICD-10-CM

## 2021-06-10 DIAGNOSIS — E7800 Pure hypercholesterolemia, unspecified: Secondary | ICD-10-CM

## 2021-06-11 ENCOUNTER — Other Ambulatory Visit: Payer: Self-pay

## 2021-06-11 MED ORDER — IBUPROFEN 600 MG PO TABS
ORAL_TABLET | Freq: Three times a day (TID) | ORAL | 0 refills | Status: DC | PRN
  Filled 2021-06-11: qty 60, 20d supply, fill #0

## 2021-06-11 MED ORDER — PRAVASTATIN SODIUM 40 MG PO TABS
ORAL_TABLET | Freq: Every day | ORAL | 0 refills | Status: DC
  Filled 2021-06-11: qty 30, 30d supply, fill #0

## 2021-07-04 ENCOUNTER — Other Ambulatory Visit: Payer: Self-pay

## 2021-07-15 ENCOUNTER — Other Ambulatory Visit: Payer: Self-pay

## 2021-07-15 ENCOUNTER — Ambulatory Visit: Payer: Medicare HMO | Attending: Family Medicine | Admitting: Family Medicine

## 2021-07-15 ENCOUNTER — Encounter: Payer: Self-pay | Admitting: Family Medicine

## 2021-07-15 VITALS — BP 141/86 | HR 109 | Temp 98.7°F | Ht 67.0 in | Wt 255.0 lb

## 2021-07-15 DIAGNOSIS — M19072 Primary osteoarthritis, left ankle and foot: Secondary | ICD-10-CM | POA: Diagnosis not present

## 2021-07-15 DIAGNOSIS — R7303 Prediabetes: Secondary | ICD-10-CM

## 2021-07-15 DIAGNOSIS — Z6839 Body mass index (BMI) 39.0-39.9, adult: Secondary | ICD-10-CM

## 2021-07-15 DIAGNOSIS — F17200 Nicotine dependence, unspecified, uncomplicated: Secondary | ICD-10-CM

## 2021-07-15 DIAGNOSIS — F17210 Nicotine dependence, cigarettes, uncomplicated: Secondary | ICD-10-CM | POA: Diagnosis not present

## 2021-07-15 DIAGNOSIS — E669 Obesity, unspecified: Secondary | ICD-10-CM

## 2021-07-15 DIAGNOSIS — I10 Essential (primary) hypertension: Secondary | ICD-10-CM

## 2021-07-15 DIAGNOSIS — E7800 Pure hypercholesterolemia, unspecified: Secondary | ICD-10-CM

## 2021-07-15 MED ORDER — NICOTINE 14 MG/24HR TD PT24
14.0000 mg | MEDICATED_PATCH | Freq: Every day | TRANSDERMAL | 1 refills | Status: DC
  Filled 2021-07-15: qty 28, 28d supply, fill #0

## 2021-07-15 MED ORDER — IBUPROFEN 600 MG PO TABS
600.0000 mg | ORAL_TABLET | Freq: Three times a day (TID) | ORAL | 3 refills | Status: DC | PRN
  Filled 2021-07-15: qty 60, 20d supply, fill #0
  Filled 2021-09-19: qty 60, 20d supply, fill #1

## 2021-07-15 MED ORDER — LISINOPRIL-HYDROCHLOROTHIAZIDE 10-12.5 MG PO TABS
1.0000 | ORAL_TABLET | Freq: Every day | ORAL | 1 refills | Status: DC
  Filled 2021-07-15: qty 90, 90d supply, fill #0
  Filled 2021-10-03: qty 30, 30d supply, fill #0
  Filled 2021-11-06: qty 30, 30d supply, fill #1
  Filled 2021-12-02: qty 30, 30d supply, fill #2
  Filled 2022-01-01: qty 30, 30d supply, fill #3

## 2021-07-15 MED ORDER — PREDNISONE 20 MG PO TABS
20.0000 mg | ORAL_TABLET | Freq: Every day | ORAL | 0 refills | Status: DC
  Filled 2021-07-15: qty 5, 5d supply, fill #0

## 2021-07-15 MED ORDER — DICLOFENAC SODIUM 1 % EX GEL
2.0000 g | Freq: Four times a day (QID) | CUTANEOUS | 1 refills | Status: AC
  Filled 2021-07-15: qty 100, 12d supply, fill #0

## 2021-07-15 MED ORDER — PRAVASTATIN SODIUM 40 MG PO TABS
ORAL_TABLET | Freq: Every day | ORAL | 1 refills | Status: DC
  Filled 2021-07-15: qty 90, 90d supply, fill #0
  Filled 2021-10-03: qty 30, 30d supply, fill #1
  Filled 2021-11-06: qty 30, 30d supply, fill #2
  Filled 2021-12-02: qty 30, 30d supply, fill #3

## 2021-07-15 NOTE — Progress Notes (Signed)
Medication refills

## 2021-07-15 NOTE — Progress Notes (Signed)
Subjective:  Patient ID: Kimberly Sosa, female    DOB: 1960-02-29  Age: 61 y.o. MRN: [id number]  CC: Hypertension   HPI Kimberly Sosa is a 61 y.o. year old female with a history of hypertension, hypercholesterolemia, degenerative disease of the lumbar spine here for chronic disease management  Interval History: Her L ankle is panful and she is wearing a brace. Previously received Cortisone injection. It has been swollen. Recent symptoms started 1 week ago. And she denies preceding trauma. X-ray of left ankle from 05/2020 revealed: IMPRESSION: Degenerative changes are noted of the midfoot and first metatarsophalangeal joint. There are areas of lucency at the second tarsometatarsal joint which may be related to osteoarthritis. However, given the surrounding soft tissue swelling and history of diabetes, a diagnosis of osteomyelitis should be considered. If there is clinical concern for osteomyelitis, follow-up with a contrast-enhanced MRI of the foot is recommended. She is working on weight loss, trying to walk and exercise by means of walking  Smokes 5-6 Cigarettes/day and would like patches. In the past 1 pack lasted her 2 days she has smoked since she was 61 years of age. Endorses adherence with her antihypertensive and her statin.  Past Medical History:  Diagnosis Date   Active smoker    Allergy    Anemia    past hx    Back pain    Carpal tunnel syndrome    LEFT HAND   DJD (degenerative joint disease), lumbar    Hypercholesterolemia    Hypertension    Kidney stone    Pinched nerve in neck     Past Surgical History:  Procedure Laterality Date   APPENDECTOMY     BREAST BIOPSY Right 07/02/2018   COLONOSCOPY     POLYPECTOMY     ROTATOR CUFF REPAIR     TOTAL HIP ARTHROPLASTY Left 04/08/2016   Procedure: LEFT TOTAL HIP ARTHROPLASTY ANTERIOR APPROACH;  Surgeon: Paralee Cancel, MD;  Location: [facility];  Service: Orthopedics;  Laterality: Left;  Requests 70 mins    TOTAL HIP ARTHROPLASTY Right 07/08/2016   Procedure: RIGHT TOTAL HIP ARTHROPLASTY ANTERIOR APPROACH;  Surgeon: Paralee Cancel, MD;  Location: [facility];  Service: Orthopedics;  Laterality: Right;    Family History  Problem Relation Age of Onset   Diabetes Mother    Hypertension Mother    Heart failure Mother    Prostate cancer Father    Diabetes Father    Diabetes Sister    Diabetes Brother        3 brothers deceased - CHF   Healthy Sister    Breast cancer Maternal Grandmother    Colon cancer Neg Hx    Rectal cancer Neg Hx    Esophageal cancer Neg Hx    Liver cancer Neg Hx    Colon polyps Neg Hx     Social History   Socioeconomic History   Marital status: Single    Spouse name: Not on file   Number of children: 2   Years of education: Not on file   Highest education level: Not on file  Occupational History   Occupation: disabled  Tobacco Use   Smoking status: Every Day    Packs/day: 0.30    Types: Cigarettes    Last attempt to quit: 07/05/2016    Years since quitting: 5.0   Smokeless tobacco: Never   Tobacco comments:    1 pack every 3 days  Substance and Sexual Activity   Alcohol use: Yes  Comment: occ    Drug use: Yes    Frequency: 3.0 times per week    Types: Marijuana    Comment: occasionally   Sexual activity: Yes    Birth control/protection: Surgical  Other Topics Concern   Not on file  Social History Narrative   Not on file   Social Determinants of Health   Financial Resource Strain: Not on file  Food Insecurity: Not on file  Transportation Needs: Not on file  Physical Activity: Not on file  Stress: Not on file  Social Connections: Not on file    Allergies  Allergen Reactions   Citrus Itching    Peaches  Oranges    Hydrocodone Other (See Comments)    Causes Headaches   Metronidazole Itching   Mobic [Meloxicam] Other (See Comments)    Causes Headaches   Tetracyclines & Related Swelling    All over body swelling    Outpatient  Medications Prior to Visit  Medication Sig Dispense Refill   Cholecalciferol (VITAMIN D3 PO) Take by mouth.     cyclobenzaprine (FLEXERIL) 10 MG tablet TAKE 1 TABLET (10 MG TOTAL) BY MOUTH 2 (TWO) TIMES DAILY AS NEEDED FOR MUSCLE SPASMS. 60 tablet 2   loratadine (CLARITIN) 10 MG tablet Take 10 mg by mouth daily.     triamcinolone cream (KENALOG) 0.1 % Apply 1 application topically 2 (two) times daily. 30 g 1   diclofenac Sodium (VOLTAREN) 1 % GEL Apply 2 g topically 4 (four) times daily. 100 g 0   ibuprofen (ADVIL) 600 MG tablet TAKE 1 TABLET (600 MG TOTAL) BY MOUTH EVERY 8 (EIGHT) HOURS AS NEEDED. 60 tablet 0   lisinopril-hydrochlorothiazide (ZESTORETIC) 10-12.5 MG tablet TAKE 1 TABLET BY MOUTH DAILY. 90 tablet 1   pravastatin (PRAVACHOL) 40 MG tablet TAKE 1 TABLET (40 MG TOTAL) BY MOUTH DAILY. 30 tablet 0   diphenhydrAMINE (BENADRYL ALLERGY) 25 MG tablet Take 1 tablet (25 mg total) by mouth every 6 (six) hours as needed. (Patient not taking: Reported on 01/14/2021) 20 tablet 0   nicotine (NICODERM CQ) 14 mg/24hr patch Place 1 patch (14 mg total) onto the skin daily. Then switch to 7 mg / 24 hours (Patient not taking: Reported on 07/15/2021) 28 patch 1   No facility-administered medications prior to visit.     ROS Review of Systems  Constitutional:  Negative for activity change and appetite change.  HENT:  Negative for sinus pressure and sore throat.   Respiratory:  Negative for chest tightness, shortness of breath and wheezing.   Cardiovascular:  Negative for chest pain and palpitations.  Gastrointestinal:  Negative for abdominal distention, abdominal pain and constipation.  Genitourinary: Negative.   Musculoskeletal:        See HPI  Psychiatric/Behavioral:  Negative for behavioral problems and dysphoric mood.     Objective:  BP (!) 141/86   Pulse (!) 109   Temp 98.7 F (37.1 C) (Oral)   Ht 5' 7"  (1.702 m)   Wt 255 lb (115.7 kg)   SpO2 98%   BMI 39.94 kg/m      07/15/2021     8:46 AM 01/14/2021    9:28 AM 07/12/2020   10:55 AM  BP/Weight  Systolic BP 621 308 657  Diastolic BP 86 85 85  Wt. (Lbs) 255 258 246  BMI 39.94 kg/m2 40.41 kg/m2 38.53 kg/m2      Physical Exam Constitutional:      Appearance: She is well-developed. She is obese.  Cardiovascular:  Rate and Rhythm: Normal rate.     Heart sounds: Normal heart sounds. No murmur heard. Pulmonary:     Effort: Pulmonary effort is normal.     Breath sounds: Normal breath sounds. No wheezing or rales.  Chest:     Chest wall: No tenderness.  Abdominal:     General: Bowel sounds are normal. There is no distension.     Palpations: Abdomen is soft. There is no mass.     Tenderness: There is no abdominal tenderness.  Musculoskeletal:     Right lower leg: No edema.     Left lower leg: No edema.     Comments: Slight edema of the dorsum of left midfoot with associated tenderness to palpation. Tenderness on eversion and inversion of left foot Right foot is normal  Neurological:     Mental Status: She is alert and oriented to person, place, and time.  Psychiatric:        Mood and Affect: Mood normal.        Latest Ref Rng & Units 01/14/2021   10:09 AM 07/12/2020   11:58 AM 12/19/2019    9:49 AM  CMP  Glucose 70 - 99 mg/dL 94  88  94   BUN 8 - 27 mg/dL 20  19  16    Creatinine 0.57 - 1.00 mg/dL 1.02  1.08  0.91   Sodium 134 - 144 mmol/L 142  142  140   Potassium 3.5 - 5.2 mmol/L 4.5  4.9  4.1   Chloride 96 - 106 mmol/L 104  104  105   CO2 20 - 29 mmol/L 22  21  20    Calcium 8.7 - 10.3 mg/dL 10.0  9.8  9.9   Total Protein 6.0 - 8.5 g/dL 6.4  6.9  7.0   Total Bilirubin 0.0 - 1.2 mg/dL 0.2  <0.2  <0.2   Alkaline Phos 44 - 121 IU/L 113  119  123   AST 0 - 40 IU/L 17  14  15    ALT 0 - 32 IU/L 16  13  11      Lipid Panel     Component Value Date/Time   CHOL 186 01/14/2021 1009   TRIG 70 01/14/2021 1009   HDL 61 01/14/2021 1009   CHOLHDL 2.7 07/12/2020 1158   LDLCALC 112 (H) 01/14/2021 1009     CBC    Component Value Date/Time   WBC 13.8 (H) 09/16/2018 1017   WBC 14.2 (H) 07/09/2016 0509   RBC 4.43 09/16/2018 1017   RBC 3.10 (L) 07/09/2016 0509   HGB 13.0 09/16/2018 1017   HCT 39.7 09/16/2018 1017   PLT 418 09/16/2018 1017   MCV 90 09/16/2018 1017   MCH 29.3 09/16/2018 1017   MCH 29.0 07/09/2016 0509   MCHC 32.7 09/16/2018 1017   MCHC 33.0 07/09/2016 0509   RDW 13.3 09/16/2018 1017   LYMPHSABS 5.3 (H) 09/16/2018 1017   EOSABS 0.2 09/16/2018 1017   BASOSABS 0.1 09/16/2018 1017    Lab Results  Component Value Date   HGBA1C 6.1 01/14/2021    Assessment & Plan:  1. Essential hypertension Slightly above goal No regimen change today Counseled on blood pressure goal of less than 130/80, low-sodium, DASH diet, medication compliance, 150 minutes of moderate intensity exercise per week. Discussed medication compliance, adverse effects. - lisinopril-hydrochlorothiazide (ZESTORETIC) 10-12.5 MG tablet; TAKE 1 TABLET BY MOUTH ONCE DAILY.  Dispense: 90 tablet; Refill: 1 - CMP14+EGFR - LP+Non-HDL Cholesterol  2. Hypercholesterolemia Controlled Low-cholesterol diet -  pravastatin (PRAVACHOL) 40 MG tablet; TAKE 1 TABLET (40 MG TOTAL) BY MOUTH ONCE DAILY.  Dispense: 90 tablet; Refill: 1  3. Tobacco use disorder Spent 3 minutes counseling on smoking cessation and she is working on quitting - nicotine (NICODERM CQ) 14 mg/24hr patch; Place 1 patch (14 mg total) onto the skin once daily. Then switch to 7 mg / 24 hours patch.  Dispense: 28 patch; Refill: 1  4. Smoking greater than 20 pack years - CT CHEST LUNG CANCER SCREENING LOW DOSE WO CONTRAST; Future  5. Prediabetes - Hemoglobin A1c  6. Obesity (BMI 35.0-39.9 without comorbidity) Counseled on initiation of weight loss medications however she would like to stick to lifestyle  7. Primary osteoarthritis of left ankle Uncontrolled Would love to refer back to podiatry but she states her insurance will be changing next  month so she would need to wait till then Continue with left ankle brace - predniSONE (DELTASONE) 20 MG tablet; Take 1 tablet (20 mg total) by mouth once daily with breakfast for 5 days.  Dispense: 5 tablet; Refill: 0 - ibuprofen (ADVIL) 600 MG tablet; Take 1 tablet (600 mg total) by mouth every 8 (eight) hours as needed.  Dispense: 60 tablet; Refill: 3 - diclofenac Sodium (VOLTAREN) 1 % GEL; Apply 2 grams topically 4 (four) times daily.  Dispense: 100 g; Refill: 1    Meds ordered this encounter  Medications   predniSONE (DELTASONE) 20 MG tablet    Sig: Take 1 tablet (20 mg total) by mouth once daily with breakfast for 5 days.    Dispense:  5 tablet    Refill:  0   lisinopril-hydrochlorothiazide (ZESTORETIC) 10-12.5 MG tablet    Sig: TAKE 1 TABLET BY MOUTH ONCE DAILY.    Dispense:  90 tablet    Refill:  1   pravastatin (PRAVACHOL) 40 MG tablet    Sig: TAKE 1 TABLET (40 MG TOTAL) BY MOUTH ONCE DAILY.    Dispense:  90 tablet    Refill:  1   ibuprofen (ADVIL) 600 MG tablet    Sig: Take 1 tablet (600 mg total) by mouth every 8 (eight) hours as needed.    Dispense:  60 tablet    Refill:  3   nicotine (NICODERM CQ) 14 mg/24hr patch    Sig: Place 1 patch (14 mg total) onto the skin once daily. Then switch to 7 mg / 24 hours patch.    Dispense:  28 patch    Refill:  1   diclofenac Sodium (VOLTAREN) 1 % GEL    Sig: Apply 2 grams topically 4 (four) times daily.    Dispense:  100 g    Refill:  1    Follow-up: Return in about 6 months (around 01/14/2022) for Chronic medical conditions.       Charlott Rakes, MD, FAAFP. [facility] [place], [place]   07/15/2021, 12:56 PM

## 2021-07-15 NOTE — Patient Instructions (Signed)

## 2021-07-16 ENCOUNTER — Other Ambulatory Visit: Payer: Self-pay

## 2021-07-16 LAB — CMP14+EGFR
ALT: 12 IU/L (ref 0–32)
AST: 12 IU/L (ref 0–40)
Albumin/Globulin Ratio: 1.9 (ref 1.2–2.2)
Albumin: 4.4 g/dL (ref 3.8–4.8)
Alkaline Phosphatase: 112 IU/L (ref 44–121)
BUN/Creatinine Ratio: 22 (ref 12–28)
BUN: 26 mg/dL (ref 8–27)
Bilirubin Total: 0.3 mg/dL (ref 0.0–1.2)
CO2: 21 mmol/L (ref 20–29)
Calcium: 10 mg/dL (ref 8.7–10.3)
Chloride: 109 mmol/L — ABNORMAL HIGH (ref 96–106)
Creatinine, Ser: 1.17 mg/dL — ABNORMAL HIGH (ref 0.57–1.00)
Globulin, Total: 2.3 g/dL (ref 1.5–4.5)
Glucose: 106 mg/dL — ABNORMAL HIGH (ref 70–99)
Potassium: 4.5 mmol/L (ref 3.5–5.2)
Sodium: 146 mmol/L — ABNORMAL HIGH (ref 134–144)
Total Protein: 6.7 g/dL (ref 6.0–8.5)
eGFR: 53 mL/min/{1.73_m2} — ABNORMAL LOW (ref >59)

## 2021-07-16 LAB — LP+NON-HDL CHOLESTEROL
Cholesterol, Total: 174 mg/dL (ref 100–199)
HDL: 54 mg/dL (ref >39)
LDL Chol Calc (NIH): 88 mg/dL (ref 0–99)
Total Non-HDL-Chol (LDL+VLDL): 120 mg/dL (ref 0–129)
Triglycerides: 186 mg/dL — ABNORMAL HIGH (ref 0–149)
VLDL Cholesterol Cal: 32 mg/dL (ref 5–40)

## 2021-07-16 LAB — HEMOGLOBIN A1C
Est. average glucose Bld gHb Est-mCnc: 128 mg/dL
Hgb A1c MFr Bld: 6.1 % — ABNORMAL HIGH (ref 4.8–5.6)

## 2021-08-08 ENCOUNTER — Ambulatory Visit
Admission: RE | Admit: 2021-08-08 | Discharge: 2021-08-08 | Disposition: A | Discharge status: Home / Self Care | Payer: Medicare Other | Source: Ambulatory Visit | Attending: Family Medicine | Admitting: Family Medicine

## 2021-08-08 DIAGNOSIS — I2510 Atherosclerotic heart disease of native coronary artery without angina pectoris: Secondary | ICD-10-CM | POA: Diagnosis not present

## 2021-08-08 DIAGNOSIS — F17210 Nicotine dependence, cigarettes, uncomplicated: Secondary | ICD-10-CM

## 2021-08-08 DIAGNOSIS — J432 Centrilobular emphysema: Secondary | ICD-10-CM | POA: Diagnosis not present

## 2021-08-08 DIAGNOSIS — K8020 Calculus of gallbladder without cholecystitis without obstruction: Secondary | ICD-10-CM | POA: Diagnosis not present

## 2021-08-23 ENCOUNTER — Other Ambulatory Visit: Payer: Self-pay

## 2021-09-19 ENCOUNTER — Other Ambulatory Visit: Payer: Self-pay

## 2021-09-20 ENCOUNTER — Other Ambulatory Visit: Payer: Self-pay

## 2021-09-23 ENCOUNTER — Telehealth: Payer: Self-pay | Admitting: Family Medicine

## 2021-09-23 NOTE — Telephone Encounter (Signed)
Copied from [place] [phone]. Topic: General - Other >> Sep 23, 2021 11:36 AM Cyndi Bender wrote: Reason for CRM: Pt stated she received a call last week asking her to schedule an appt but she already has an appt scheduled for 01/14/22. Pt would like to ask if she needs to schedule another appt. Cb# [phone]

## 2021-09-23 NOTE — Telephone Encounter (Signed)
Pt was called and informed of appointment details for her Wellness visit.

## 2021-10-01 ENCOUNTER — Telehealth: Payer: Self-pay

## 2021-10-01 ENCOUNTER — Ambulatory Visit: Payer: Medicare Other | Attending: Family Medicine

## 2021-10-01 DIAGNOSIS — E2839 Other primary ovarian failure: Secondary | ICD-10-CM

## 2021-10-01 DIAGNOSIS — Z0000 Encounter for general adult medical examination without abnormal findings: Secondary | ICD-10-CM

## 2021-10-01 MED ORDER — HYDROXYZINE HCL 25 MG PO TABS
25.0000 mg | ORAL_TABLET | Freq: Three times a day (TID) | ORAL | 1 refills | Status: DC | PRN
  Filled 2021-10-01: qty 60, 20d supply, fill #0

## 2021-10-01 NOTE — Progress Notes (Signed)
Subjective:   Kimberly Sosa is a 61 y.o. female who presents for Medicare Annual (Subsequent) preventive examination.  Review of Systems     I connected with Ardeen Jourdain on 10/01/2021 at 10:18 am by telephone and verified that I am speaking with the correct person using two identifiers. I discussed the limitations, risks, security and privacy concerns of performing an evaluation and management service by telephone and the availability of in person appointments. I also discussed with the patient that there may be a patient responsible charge related to this service. The patient expressed understanding and agreed to proceed.   Patient location: Home  My Location: [place] on the telephone call: Myself and patient      Cardiac Risk Factors include: none     Objective:    There were no vitals filed for this visit. There is no height or weight on file to calculate BMI.     10/01/2021   10:23 AM 01/24/2021   10:24 AM 07/12/2020   10:57 AM 08/23/2019    9:59 AM 12/19/2016    9:05 AM 12/04/2016   10:19 AM 08/26/2016    9:44 AM  Advanced Directives  Does Patient Have a Medical Advance Directive? No No No No No No No  Would patient like information on creating a medical advance directive? Yes (ED - Information included in AVS) No - Patient declined    No - Patient declined     Current Medications (verified) Outpatient Encounter Medications as of 10/01/2021  Medication Sig   Cholecalciferol (VITAMIN D3 PO) Take by mouth.   cyclobenzaprine (FLEXERIL) 10 MG tablet TAKE 1 TABLET (10 MG TOTAL) BY MOUTH 2 (TWO) TIMES DAILY AS NEEDED FOR MUSCLE SPASMS.   diclofenac Sodium (VOLTAREN) 1 % GEL Apply 2 grams topically 4 (four) times daily.   ibuprofen (ADVIL) 600 MG tablet Take 1 tablet (600 mg total) by mouth every 8 (eight) hours as needed.   lisinopril-hydrochlorothiazide (ZESTORETIC) 10-12.5 MG tablet TAKE 1 TABLET BY MOUTH ONCE DAILY.   loratadine  (CLARITIN) 10 MG tablet Take 10 mg by mouth daily.   nicotine (NICODERM CQ) 14 mg/24hr patch Place 1 patch (14 mg total) onto the skin once daily. Then switch to 7 mg / 24 hours patch.   pravastatin (PRAVACHOL) 40 MG tablet TAKE 1 TABLET (40 MG TOTAL) BY MOUTH ONCE DAILY.   predniSONE (DELTASONE) 20 MG tablet Take 1 tablet (20 mg total) by mouth once daily with breakfast for 5 days.   triamcinolone cream (KENALOG) 0.1 % Apply 1 application topically 2 (two) times daily.   diphenhydrAMINE (BENADRYL ALLERGY) 25 MG tablet Take 1 tablet (25 mg total) by mouth every 6 (six) hours as needed. (Patient not taking: Reported on 01/14/2021)   No facility-administered encounter medications on file as of 10/01/2021.    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   Procedure: LEFT TOTAL HIP ARTHROPLASTY ANTERIOR APPROACH;  Surgeon: Paralee Cancel, MD;  Location: [facility];  Service: Orthopedics;  Laterality: Left;  Requests 70 mins   TOTAL HIP ARTHROPLASTY Right 07/08/2016   Procedure: RIGHT TOTAL HIP ARTHROPLASTY ANTERIOR APPROACH;  Surgeon: Paralee Cancel, MD;  Location: [facility];  Service: Orthopedics;  Laterality: Right;   Family History  Problem Relation Age of Onset   Diabetes Mother    Hypertension Mother    Heart failure Mother    Prostate cancer Father    Diabetes Father    Diabetes Sister    Diabetes Brother        3 brothers deceased - CHF   Healthy  Sister    Breast cancer Maternal Grandmother    Colon cancer Neg Hx    Rectal cancer Neg Hx    Esophageal cancer Neg Hx    Liver cancer Neg Hx    Colon polyps Neg Hx    Social History   Socioeconomic History   Marital status: Single    Spouse name: Not on file   Number of children: 2   Years of education: Not on file   Highest education level: Not on file  Occupational History   Occupation: disabled  Tobacco Use   Smoking status: Every Day    Packs/day: 0.30    Types: Cigarettes    Last attempt to quit: 07/05/2016    Years since quitting: 5.2   Smokeless tobacco: Never   Tobacco comments:    1 pack every 3 days  Substance and Sexual Activity   Alcohol use: Yes    Comment: occ    Drug use: Yes    Frequency: 3.0 times per week    Types: Marijuana    Comment: occasionally   Sexual activity: Yes    Birth control/protection: Surgical  Other Topics Concern   Not on file  Social History Narrative   Not on file   Social Determinants of Health   Financial Resource Strain: Low Risk  (10/01/2021)   Overall Financial Resource Strain (CARDIA)    Difficulty of Paying Living Expenses: Not very hard  Food Insecurity: No Food Insecurity (10/01/2021)   Hunger Vital Sign    Worried About Running Out of Food in the Last Year: Never true    Ran Out of Food in the Last Year: Never true  Transportation Needs: No Transportation Needs (10/01/2021)   PRAPARE - Hydrologist (Medical): No    Lack of Transportation (Non-Medical): No  Physical Activity: Insufficiently Active (10/01/2021)   Exercise Vital Sign    Days of Exercise per Week: 7 days    Minutes of Exercise per Session: 10 min  Stress: No Stress Concern Present (10/01/2021)   [place]    Feeling of Stress : Not at all  Social Connections: Socially Isolated (10/01/2021)   Social Connection and Isolation Panel [NHANES]    Frequency of  Communication with Friends and Family: Three times a week    Frequency of Social Gatherings with Friends and Family: Twice a week    Attends Religious Services: Never    Marine scientist or Organizations: No    Attends Music therapist: Never    Marital Status: Never married    Tobacco Counseling Ready to quit: Not Answered Counseling given: Not Answered Tobacco comments: 1 pack  every 3 days   Clinical Intake:  Pre-visit preparation completed: No  Pain : No/denies pain     Nutritional Risks: None Diabetes: No  How often do you need to have someone help you when you read instructions, pamphlets, or other written materials from your doctor or pharmacy?: 1 - Never  Diabetic?No  Interpreter Needed?: No      Activities of Daily Living    10/01/2021   10:24 AM  In your present state of health, do you have any difficulty performing the following activities:  Hearing? 0  Vision? 0  Difficulty concentrating or making decisions? 0  Walking or climbing stairs? 0  Dressing or bathing? 0  Doing errands, shopping? 0  Preparing Food and eating ? N  Using the Toilet? N  In the past six months, have you accidently leaked urine? N  Do you have problems with loss of bowel control? N  Managing your Medications? N  Managing your Finances? N  Housekeeping or managing your Housekeeping? N    Patient Care Team: Charlott Rakes, MD as PCP - General (Family Medicine)  Indicate any recent Medical Services you may have received from other than [facility] providers in the past year (date may be approximate).     Assessment:   This is a routine wellness examination for Jerney.  Hearing/Vision screen No results found.  Dietary issues and exercise activities discussed: Current Exercise Habits: Home exercise routine, Type of exercise: walking, Time (Minutes): 10, Frequency (Times/Week): 7, Weekly Exercise (Minutes/Week): 70, Intensity: Mild   Goals Addressed   None     Depression Screen    10/01/2021   10:24 AM 07/15/2021    8:44 AM 01/14/2021    9:38 AM 07/12/2020   10:57 AM 05/30/2020    1:51 PM 05/23/2020   11:24 AM 12/19/2019    9:26 AM  PHQ 2/9 Scores  PHQ - 2 Score 0 0 0 0 0 0 0  PHQ- 9 Score  0 0  0  0    Fall Risk    10/01/2021   10:23 AM 07/15/2021    8:44 AM 01/14/2021    9:30 AM 07/12/2020   10:57 AM 05/30/2020    1:47 PM  Fall Risk   Falls in the past year? 0 0 0 0 0  Number falls in past yr: 0 0 0 0 0  Injury with Fall? 0 0 0 0 0  Risk for fall due to : No Fall Risks      Follow up Education provided        FALL RISK PREVENTION PERTAINING TO THE HOME:  Any stairs in or around the home? Yes  If so, are there any without handrails? Yes  Home free of loose throw rugs in walkways, pet beds, electrical cords, etc? Yes  Adequate lighting in your home to reduce risk of falls? Yes   ASSISTIVE DEVICES UTILIZED TO PREVENT FALLS:  Life alert? No  Use of a cane, walker or w/c? No  Grab bars in the bathroom? Yes  Shower chair or bench in shower? No  Elevated toilet seat or a handicapped toilet? No   TIMED UP AND GO:  Was the test performed? No .  Length of time to ambulate 10 feet: 0  sec.   Gait slow and steady without use of assistive device  Cognitive Function:    10/01/2021   10:29 AM  MMSE - Mini Mental State Exam  Not completed: Unable to complete  10/01/2021   10:29 AM  6CIT Screen  What Year? 0 points  What month? 0 points  What time? 0 points  Count back from 20 0 points  Months in reverse 0 points  Repeat phrase 0 points  Total Score 0 points    Immunizations Immunization History  Administered Date(s) Administered   PNEUMOCOCCAL CONJUGATE-20 07/12/2020    TDAP status: Due, Education has been provided regarding the importance of this vaccine. Advised may receive this vaccine at local pharmacy or Health Dept. Aware to provide a copy of the vaccination record if obtained from local pharmacy or  Health Dept. Verbalized acceptance and understanding.  Flu Vaccine: Declined  Pneumococcal vaccine status: Up to date  Covid-19 vaccine status: Declined, Education has been provided regarding the importance of this vaccine but patient still declined. Advised may receive this vaccine at local pharmacy or Health Dept.or vaccine clinic. Aware to provide a copy of the vaccination record if obtained from local pharmacy or Health Dept. Verbalized acceptance and understanding.  Qualifies for Shingles Vaccine? Yes   Zostavax completed No   Shingrix Completed?: No.    Education has been provided regarding the importance of this vaccine. Patient has been advised to call insurance company to determine out of pocket expense if they have not yet received this vaccine. Advised may also receive vaccine at local pharmacy or Health Dept. Verbalized acceptance and understanding.  Screening Tests Health Maintenance  Topic Date Due   COVID-19 Vaccine (1) Never done   INFLUENZA VACCINE  09/03/2021   Zoster Vaccines- Shingrix (1 of 2) 10/15/2021 (Originally 05/13/2010)   TETANUS/TDAP  01/14/2022 (Originally 05/13/1979)   MAMMOGRAM  10/05/2022   PAP SMEAR-Modifier  07/13/2023   COLONOSCOPY (Pts 45-58yr Insurance coverage will need to be confirmed)  05/26/2027   Hepatitis C Screening  Completed   HIV Screening  Completed   HPV VACCINES  Aged Out    Health Maintenance  Health Maintenance Due  Topic Date Due   COVID-19 Vaccine (1) Never done   INFLUENZA VACCINE  09/03/2021    Colorectal cancer screening: Type of screening: Colonoscopy. Completed 05/25/2020. Repeat every 7 years  Mammogram status: Completed 10/04/2020. Repeat every year  Bone Density status: Ordered 10/01/2021. Pt provided with contact info and advised to call to schedule appt.  Lung Cancer Screening: (Low Dose CT Chest recommended if Age 61-80years, 30 pack-year currently smoking OR have quit w/in 15years.) does qualify.   Lung Cancer  Screening Referral: done 08/08/2021  Additional Screening:  Hepatitis C Screening: does qualify; Completed 08/26/2016  Vision Screening: Recommended annual ophthalmology exams for early detection of glaucoma and other disorders of the eye. Is the patient up to date with their annual eye exam?  No  Who is the provider or what is the name of the office in which the patient attends annual eye exams? N/A If pt is not established with a provider, would they like to be referred to a provider to establish care? No .   Dental Screening: Recommended annual dental exams for proper oral hygiene  Community Resource Referral / Chronic Care Management: CRR required this visit?  No   CCM required this visit?  No      Plan:     I have personally reviewed and noted the following in the patient's chart:   Medical and social history Use of alcohol, tobacco or illicit drugs  Current medications and supplements including opioid prescriptions. Patient is not currently taking opioid prescriptions. Functional ability and status  Nutritional status Physical activity Advanced directives List of other physicians Hospitalizations, surgeries, and ER visits in previous 12 months Vitals Screenings to include cognitive, depression, and falls Referrals and appointments  In addition, I have reviewed and discussed with patient certain preventive protocols, quality metrics, and best practice recommendations. A written personalized care plan for preventive services as well as general preventive health recommendations were provided to patient.     Gomez Cleverly, [place]   10/01/2021   Nurse Notes: I spent 30 minutes on this telephone encounter  AVS mailed to patient

## 2021-10-01 NOTE — Patient Instructions (Signed)
[name] , Thank you for taking time to come for your Medicare Wellness Visit. I appreciate your ongoing commitment to your health goals. Please review the following plan we discussed and let me know if I can assist you in the future.   These are the goals we discussed:  Goals   None     This is a list of the screening recommended for you and due dates:  Health Maintenance  Topic Date Due   COVID-19 Vaccine (1) Never done   Flu Shot  09/03/2021   Zoster (Shingles) Vaccine (1 of 2) 10/15/2021*   Tetanus Vaccine  01/14/2022*   Mammogram  10/05/2022   Pap Smear  07/13/2023   Colon Cancer Screening  05/26/2027   Hepatitis C Screening: USPSTF Recommendation to screen - Ages 18-79 yo.  Completed   HIV Screening  Completed   HPV Vaccine  Aged Out  *Topic was postponed. The date shown is not the original due date.  Health Maintenance, Female Adopting a healthy lifestyle and getting preventive care are important in promoting health and wellness. Ask your health care provider about: The right schedule for you to have regular tests and exams. Things you can do on your own to prevent diseases and keep yourself healthy. What should I know about diet, weight, and exercise? Eat a healthy diet  Eat a diet that includes plenty of vegetables, fruits, low-fat dairy products, and lean protein. Do not eat a lot of foods that are high in solid fats, added sugars, or sodium. Maintain a healthy weight Body mass index (BMI) is used to identify weight problems. It estimates body fat based on height and weight. Your health care provider can help determine your BMI and help you achieve or maintain a healthy weight. Get regular exercise Get regular exercise. This is one of the most important things you can do for your health. Most adults should: Exercise for at least 150 minutes each week. The exercise should increase your heart rate and make you sweat (moderate-intensity exercise). Do strengthening  exercises at least twice a week. This is in addition to the moderate-intensity exercise. Spend less time sitting. Even light physical activity can be beneficial. Watch cholesterol and blood lipids Have your blood tested for lipids and cholesterol at 61 years of age, then have this test every 5 years. Have your cholesterol levels checked more often if: Your lipid or cholesterol levels are high. You are older than 61 years of age. You are at high risk for heart disease. What should I know about cancer screening? Depending on your health history and family history, you may need to have cancer screening at various ages. This may include screening for: Breast cancer. Cervical cancer. Colorectal cancer. Skin cancer. Lung cancer. What should I know about heart disease, diabetes, and high blood pressure? Blood pressure and heart disease High blood pressure causes heart disease and increases the risk of stroke. This is more likely to develop in people who have high blood pressure readings or are overweight. Have your blood pressure checked: Every 3-5 years if you are 20-52 years of age. Every year if you are 47 years old or older. Diabetes Have regular diabetes screenings. This checks your fasting blood sugar level. Have the screening done: Once every three years after age 77 if you are at a normal weight and have a low risk for diabetes. More often and at a younger age if you are overweight or have a high risk for diabetes. What should  I know about preventing infection? Hepatitis B If you have a higher risk for hepatitis B, you should be screened for this virus. Talk with your health care provider to find out if you are at risk for hepatitis B infection. Hepatitis C Testing is recommended for: Everyone born from [birth year] through 1965. Anyone with known risk factors for hepatitis C. Sexually transmitted infections (STIs) Get screened for STIs, including gonorrhea and chlamydia, if: You are  sexually active and are younger than 61 years of age. You are older than 61 years of age and your health care provider tells you that you are at risk for this type of infection. Your sexual activity has changed since you were last screened, and you are at increased risk for chlamydia or gonorrhea. Ask your health care provider if you are at risk. Ask your health care provider about whether you are at high risk for HIV. Your health care provider may recommend a prescription medicine to help prevent HIV infection. If you choose to take medicine to prevent HIV, you should first get tested for HIV. You should then be tested every 3 months for as long as you are taking the medicine. Pregnancy If you are about to stop having your period (premenopausal) and you may become pregnant, seek counseling before you get pregnant. Take 400 to 800 micrograms (mcg) of folic acid every day if you become pregnant. Ask for birth control (contraception) if you want to prevent pregnancy. Osteoporosis and menopause Osteoporosis is a disease in which the bones lose minerals and strength with aging. This can result in bone fractures. If you are 35 years old or older, or if you are at risk for osteoporosis and fractures, ask your health care provider if you should: Be screened for bone loss. Take a calcium or vitamin D supplement to lower your risk of fractures. Be given hormone replacement therapy (HRT) to treat symptoms of menopause. Follow these instructions at home: Alcohol use Do not drink alcohol if: Your health care provider tells you not to drink. You are pregnant, may be pregnant, or are planning to become pregnant. If you drink alcohol: Limit how much you have to: 0-1 drink a day. Know how much alcohol is in your drink. In the [place] one drink equals one 12 oz bottle of beer (355 mL), one 5 oz glass of wine (148 mL), or one 1 oz glass of hard liquor (44 mL). Lifestyle Do not use any products that contain  nicotine or tobacco. These products include cigarettes, chewing tobacco, and vaping devices, such as e-cigarettes. If you need help quitting, ask your health care provider. Do not use street drugs. Do not share needles. Ask your health care provider for help if you need support or information about quitting drugs. General instructions Schedule regular health, dental, and eye exams. Stay current with your vaccines. Tell your health care provider if: You often feel depressed. You have ever been abused or do not feel safe at home. Summary Adopting a healthy lifestyle and getting preventive care are important in promoting health and wellness. Follow your health care provider's instructions about healthy diet, exercising, and getting tested or screened for diseases. Follow your health care provider's instructions on monitoring your cholesterol and blood pressure. This information is not intended to replace advice given to you by your health care provider. Make sure you discuss any questions you have with your health care provider. Document Revised: 06/11/2020 Document Reviewed: 06/11/2020 Elsevier Patient Education  [place].

## 2021-10-01 NOTE — Telephone Encounter (Signed)
I have sent a prescription to the pharmacy for her.

## 2021-10-01 NOTE — Telephone Encounter (Signed)
Pt states that she lost her sister and is asking if she can get any type of medication for her nerves.

## 2021-10-02 ENCOUNTER — Other Ambulatory Visit: Payer: Self-pay

## 2021-10-03 NOTE — Telephone Encounter (Signed)
Pt has been called and informed of medication being sent to pharmacy.

## 2021-10-04 ENCOUNTER — Other Ambulatory Visit: Payer: Self-pay

## 2021-10-10 ENCOUNTER — Other Ambulatory Visit: Payer: Self-pay

## 2021-10-25 ENCOUNTER — Telehealth: Payer: Self-pay | Admitting: Family Medicine

## 2021-10-25 NOTE — Telephone Encounter (Unsigned)
Copied from [place] [phone]. Topic: General - Other >> Oct 25, 2021 10:44 AM Everette C wrote: Reason for CRM: Alex with [employer] has called to request contact with a member of clinical staff to review the patient's maintenance medications when possible   [employer] is reuqesting a 100 day supply of medications once reviewed and discussed   The patient will also be using a new pharmacy  [facility] - [place], [address] [place] [address] Phone: [phone] Fax: [phone] Hours: Not open 24 hours  Please contact further when available

## 2021-11-01 ENCOUNTER — Other Ambulatory Visit: Payer: Self-pay

## 2021-11-06 ENCOUNTER — Other Ambulatory Visit: Payer: Self-pay

## 2021-11-08 ENCOUNTER — Other Ambulatory Visit: Payer: Self-pay

## 2021-11-11 ENCOUNTER — Telehealth: Payer: Self-pay | Admitting: Emergency Medicine

## 2021-11-11 NOTE — Telephone Encounter (Signed)
Copied from [place] [phone]. Topic: General - Other >> Nov 11, 2021 10:46 AM Shiquita J wrote: Reason for CRM: pt called in to verify. Pt says that she received a notice that she is due to have her mammogram. Pt says that she was told by nurse in office that she isn't due. Pt would like to confirm.   CB: [phone]- please advise.

## 2021-11-12 ENCOUNTER — Other Ambulatory Visit: Payer: Self-pay

## 2021-11-12 DIAGNOSIS — Z1231 Encounter for screening mammogram for malignant neoplasm of breast: Secondary | ICD-10-CM

## 2021-11-12 NOTE — Telephone Encounter (Signed)
Pt was called and informed that she is due for another MM due to the one from 2022 saying that she needed to repeat in 1 year. Pt has been informed and order has been placed.

## 2021-12-02 ENCOUNTER — Other Ambulatory Visit: Payer: Self-pay

## 2021-12-19 ENCOUNTER — Ambulatory Visit: Payer: Medicare Other

## 2021-12-20 ENCOUNTER — Ambulatory Visit
Admission: RE | Admit: 2021-12-20 | Discharge: 2021-12-20 | Disposition: A | Discharge status: Home / Self Care | Payer: Medicare Other | Source: Ambulatory Visit | Attending: Family Medicine | Admitting: Family Medicine

## 2021-12-20 DIAGNOSIS — Z1231 Encounter for screening mammogram for malignant neoplasm of breast: Secondary | ICD-10-CM | POA: Diagnosis not present

## 2021-12-23 ENCOUNTER — Other Ambulatory Visit: Payer: Self-pay

## 2022-01-01 ENCOUNTER — Other Ambulatory Visit: Payer: Self-pay

## 2022-01-01 ENCOUNTER — Other Ambulatory Visit: Payer: Self-pay | Admitting: Family Medicine

## 2022-01-01 DIAGNOSIS — E7800 Pure hypercholesterolemia, unspecified: Secondary | ICD-10-CM

## 2022-01-01 MED ORDER — PRAVASTATIN SODIUM 40 MG PO TABS
40.0000 mg | ORAL_TABLET | Freq: Every day | ORAL | 0 refills | Status: DC
  Filled 2022-01-01: qty 90, 90d supply, fill #0

## 2022-01-02 ENCOUNTER — Other Ambulatory Visit: Payer: Self-pay

## 2022-01-14 ENCOUNTER — Ambulatory Visit: Payer: Medicare HMO | Admitting: Family Medicine

## 2022-01-16 ENCOUNTER — Other Ambulatory Visit: Payer: Self-pay

## 2022-01-16 ENCOUNTER — Ambulatory Visit: Payer: Medicare Other | Attending: Family Medicine | Admitting: Family Medicine

## 2022-01-16 ENCOUNTER — Encounter: Payer: Self-pay | Admitting: Family Medicine

## 2022-01-16 VITALS — BP 136/87 | HR 82 | Temp 98.9°F | Resp 16 | Ht 67.5 in | Wt 244.0 lb

## 2022-01-16 DIAGNOSIS — Z79899 Other long term (current) drug therapy: Secondary | ICD-10-CM | POA: Insufficient documentation

## 2022-01-16 DIAGNOSIS — E7800 Pure hypercholesterolemia, unspecified: Secondary | ICD-10-CM

## 2022-01-16 DIAGNOSIS — G8929 Other chronic pain: Secondary | ICD-10-CM

## 2022-01-16 DIAGNOSIS — M25562 Pain in left knee: Secondary | ICD-10-CM | POA: Diagnosis not present

## 2022-01-16 DIAGNOSIS — I10 Essential (primary) hypertension: Secondary | ICD-10-CM | POA: Diagnosis not present

## 2022-01-16 DIAGNOSIS — R7303 Prediabetes: Secondary | ICD-10-CM | POA: Insufficient documentation

## 2022-01-16 DIAGNOSIS — D72829 Elevated white blood cell count, unspecified: Secondary | ICD-10-CM

## 2022-01-16 DIAGNOSIS — I498 Other specified cardiac arrhythmias: Secondary | ICD-10-CM | POA: Diagnosis not present

## 2022-01-16 DIAGNOSIS — Z8249 Family history of ischemic heart disease and other diseases of the circulatory system: Secondary | ICD-10-CM | POA: Diagnosis not present

## 2022-01-16 MED ORDER — LISINOPRIL-HYDROCHLOROTHIAZIDE 10-12.5 MG PO TABS
1.0000 | ORAL_TABLET | Freq: Every day | ORAL | 1 refills | Status: DC
  Filled 2022-01-16 – 2022-02-04 (×2): qty 90, 90d supply, fill #0
  Filled 2022-04-21: qty 90, 90d supply, fill #1

## 2022-01-16 MED ORDER — PRAVASTATIN SODIUM 40 MG PO TABS
40.0000 mg | ORAL_TABLET | Freq: Every day | ORAL | 1 refills | Status: DC
  Filled 2022-01-16 – 2022-03-31 (×2): qty 90, 90d supply, fill #0
  Filled 2022-07-10: qty 90, 90d supply, fill #1

## 2022-01-16 NOTE — Progress Notes (Signed)
Subjective:  Patient ID: Kimberly Sosa, female    DOB: [birth year]  Age: 61 y.o. MRN: [id number]  CC: Medication Management and Knee Pain   HPI Kimberly Sosa is a 61 y.o. year old female with a history of hypertension, hypercholesterolemia, degenerative disease of the lumbar spine here   Interval History:  She lost her Sister 3 months ago and this has been hard for her. Death was sudden and she had no medical conditions and she was wondering if this is cardiac related. She would like her heart checked as she has a Fhx of cardiac disease in Dad, Mom, brother.  She has no chest pain, dyspnea, palpitations or lightheadedness.  She is doing well on her antihypertensive and statin.  She does have intermittent knee pain for which she uses ibuprofen.  She did have hip replacement and is concerned because the media have broadcasted cases of patients who have had problems with the hip prosthesis.  So far she is not having any hip discomfort but does have intermittent left knee pain. Past Medical History:  Diagnosis Date   Active smoker    Allergy    Anemia    past hx    Back pain    Carpal tunnel syndrome    LEFT HAND   DJD (degenerative joint disease), lumbar    Hypercholesterolemia    Hypertension    Kidney stone    Pinched nerve in neck     Past Surgical History:  Procedure Laterality Date   APPENDECTOMY     BREAST BIOPSY Right 07/02/2018   COLONOSCOPY     POLYPECTOMY     ROTATOR CUFF REPAIR     TOTAL HIP ARTHROPLASTY Left 04/08/2016   Procedure: LEFT TOTAL HIP ARTHROPLASTY ANTERIOR APPROACH;  Surgeon: Paralee Cancel, MD;  Location: [facility];  Service: Orthopedics;  Laterality: Left;  Requests 70 mins   TOTAL HIP ARTHROPLASTY Right 07/08/2016   Procedure: RIGHT TOTAL HIP ARTHROPLASTY ANTERIOR APPROACH;  Surgeon: Paralee Cancel, MD;  Location: [facility];  Service: Orthopedics;  Laterality: Right;    Family History  Problem Relation Age of Onset   Diabetes Mother     Hypertension Mother    Heart failure Mother    Prostate cancer Father    Diabetes Father    Diabetes Sister    Diabetes Brother        3 brothers deceased - CHF   Healthy Sister    Breast cancer Maternal Grandmother    Colon cancer Neg Hx    Rectal cancer Neg Hx    Esophageal cancer Neg Hx    Liver cancer Neg Hx    Colon polyps Neg Hx     Social History   Socioeconomic History   Marital status: Single    Spouse name: Not on file   Number of children: 2   Years of education: Not on file   Highest education level: Not on file  Occupational History   Occupation: disabled  Tobacco Use   Smoking status: Every Day    Packs/day: 0.30    Types: Cigarettes    Last attempt to quit: 07/05/2016    Years since quitting: 5.5   Smokeless tobacco: Never   Tobacco comments:    1 pack every 3 days  Substance and Sexual Activity   Alcohol use: Yes    Comment: occ    Drug use: Yes    Frequency: 3.0 times per week    Types: Marijuana    Comment:  occasionally   Sexual activity: Yes    Birth control/protection: Surgical  Other Topics Concern   Not on file  Social History Narrative   Not on file   Social Determinants of Health   Financial Resource Strain: Low Risk  (10/01/2021)   Overall Financial Resource Strain (CARDIA)    Difficulty of Paying Living Expenses: Not very hard  Food Insecurity: No Food Insecurity (01/17/2022)   Hunger Vital Sign    Worried About Running Out of Food in the Last Year: Never true    Ran Out of Food in the Last Year: Never true  Transportation Needs: No Transportation Needs (10/01/2021)   PRAPARE - Hydrologist (Medical): No    Lack of Transportation (Non-Medical): No  Physical Activity: Insufficiently Active (10/01/2021)   Exercise Vital Sign    Days of Exercise per Week: 7 days    Minutes of Exercise per Session: 10 min  Stress: No Stress Concern Present (10/01/2021)   [place]    Feeling of Stress : Not at all  Social Connections: Socially Isolated (10/01/2021)   Social Connection and Isolation Panel [NHANES]    Frequency of Communication with Friends and Family: Three times a week    Frequency of Social Gatherings with Friends and Family: Twice a week    Attends Religious Services: Never    Marine scientist or Organizations: No    Attends Music therapist: Never    Marital Status: Never married    Allergies  Allergen Reactions   Citrus Itching    Peaches  Oranges    Hydrocodone Other (See Comments)    Causes Headaches   Metronidazole Itching   Mobic [Meloxicam] Other (See Comments)    Causes Headaches   Tetracyclines & Related Swelling    All over body swelling    Outpatient Medications Prior to Visit  Medication Sig Dispense Refill   Cholecalciferol (VITAMIN D3 PO) Take by mouth.     diclofenac Sodium (VOLTAREN) 1 % GEL Apply 2 grams topically 4 (four) times daily. 100 g 1   ibuprofen (ADVIL) 600 MG tablet Take 1 tablet (600 mg total) by mouth every 8 (eight) hours as needed. 60 tablet 3   triamcinolone cream (KENALOG) 0.1 % Apply 1 application topically 2 (two) times daily. 30 g 1   lisinopril-hydrochlorothiazide (ZESTORETIC) 10-12.5 MG tablet TAKE 1 TABLET BY MOUTH ONCE DAILY. 90 tablet 1   pravastatin (PRAVACHOL) 40 MG tablet Take 1 tablet (40 mg total) by mouth daily. 90 tablet 0   diphenhydrAMINE (BENADRYL ALLERGY) 25 MG tablet Take 1 tablet (25 mg total) by mouth every 6 (six) hours as needed. (Patient not taking: Reported on 01/14/2021) 20 tablet 0   hydrOXYzine (ATARAX) 25 MG tablet Take 1 tablet (25 mg total) by mouth 3 (three) times daily as needed for anxiety. 60 tablet 1   loratadine (CLARITIN) 10 MG tablet Take 10 mg by mouth daily.     nicotine (NICODERM CQ) 14 mg/24hr patch Place 1 patch (14 mg total) onto the skin once daily. Then switch to 7 mg / 24 hours patch. 28 patch 1    predniSONE (DELTASONE) 20 MG tablet Take 1 tablet (20 mg total) by mouth once daily with breakfast for 5 days. 5 tablet 0   No facility-administered medications prior to visit.     ROS Review of Systems  Constitutional:  Negative for activity change and appetite change.  HENT:  Negative for sinus pressure and sore throat.   Respiratory:  Negative for chest tightness, shortness of breath and wheezing.   Cardiovascular:  Negative for chest pain and palpitations.  Gastrointestinal:  Negative for abdominal distention, abdominal pain and constipation.  Genitourinary: Negative.   Musculoskeletal:        See HPI  Psychiatric/Behavioral:  Negative for behavioral problems and dysphoric mood.     Objective:  BP 136/87 (BP Location: Left Arm, Patient Position: Sitting, Cuff Size: Large)   Pulse 82   Temp 98.9 F (37.2 C)   Resp 16   Ht 5' 7.5" (1.715 m)   Wt 244 lb (110.7 kg)   SpO2 99%   BMI 37.65 kg/m      01/16/2022    2:22 PM 07/15/2021    8:46 AM 01/14/2021    9:28 AM  BP/Weight  Systolic BP 407 680 881  Diastolic BP 87 86 85  Wt. (Lbs) 244 255 258  BMI 37.65 kg/m2 39.94 kg/m2 40.41 kg/m2      Physical Exam Constitutional:      Appearance: She is well-developed.  Cardiovascular:     Rate and Rhythm: Normal rate.     Heart sounds: Normal heart sounds. No murmur heard. Pulmonary:     Effort: Pulmonary effort is normal.     Breath sounds: Normal breath sounds. No wheezing or rales.  Chest:     Chest wall: No tenderness.  Abdominal:     General: Bowel sounds are normal. There is no distension.     Palpations: Abdomen is soft. There is no mass.     Tenderness: There is no abdominal tenderness.  Musculoskeletal:        General: Normal range of motion.     Right lower leg: No edema.     Left lower leg: No edema.  Neurological:     Mental Status: She is alert and oriented to person, place, and time.  Psychiatric:        Mood and Affect: Mood normal.         Latest Ref Rng & Units 01/16/2022    3:26 PM 07/15/2021    9:26 AM 01/14/2021   10:09 AM  CMP  Glucose 70 - 99 mg/dL 114  106  94   BUN 8 - 27 mg/dL _0 Creatinine 0.57 - 1.00 mg/dL 1.09  1.17  1.02   Sodium 134 - 144 mmol/L 143  146  142   Potassium 3.5 - 5.2 mmol/L 4.4  4.5  4.5   Chloride 96 - 106 mmol/L 104  109  104   CO2 20 - 29 mmol/L _1 Calcium 8.7 - 10.3 mg/dL 10.3  10.0  10.0   Total Protein 6.0 - 8.5 g/dL 7.1  6.7  6.4   Total Bilirubin 0.0 - 1.2 mg/dL 0.2  0.3  0.2   Alkaline Phos 44 - 121 IU/L 120  112  113   AST 0 - 40 IU/L _2 ALT 0 - 32 IU/L _3 Lipid Panel     Component Value Date/Time   CHOL 174 07/15/2021 0926   TRIG 186 (H) 07/15/2021 0926   HDL 54 07/15/2021 0926   CHOLHDL 2.7 07/12/2020 1158   LDLCALC 88 07/15/2021 0926    CBC    Component Value Date/Time   WBC 14.6 (H) 01/16/2022 1526  WBC 14.2 (H) 07/09/2016 0509   RBC 4.44 01/16/2022 1526   RBC 3.10 (L) 07/09/2016 0509   HGB 13.1 01/16/2022 1526   HCT 40.2 01/16/2022 1526   PLT 452 (H) 01/16/2022 1526   MCV 91 01/16/2022 1526   MCH 29.5 01/16/2022 1526   MCH 29.0 07/09/2016 0509   MCHC 32.6 01/16/2022 1526   MCHC 33.0 07/09/2016 0509   RDW 13.2 01/16/2022 1526   LYMPHSABS 5.1 (H) 01/16/2022 1526   EOSABS 0.2 01/16/2022 1526   BASOSABS 0.1 01/16/2022 1526    Lab Results  Component Value Date   HGBA1C 6.1 (H) 01/16/2022    Lab Results  Component Value Date   TSH 1.850 01/16/2022    Assessment & Plan:  1. Leukocytosis, unspecified type Last cbc had revealed leukocytosis Will check CBC again - CBC with Differential/Platelet  2. Prediabetes Labs reveal prediabetes with an A1c of 6.1.  Working on a low carbohydrate diet, exercise, weight loss is recommended in order to prevent progression to type 2 diabetes mellitus.  - CMP14+EGFR - Hemoglobin A1c  3. Essential hypertension Controlled Continue current regimen Counseled on blood  pressure goal of less than 130/80, low-sodium, DASH diet, medication compliance, 150 minutes of moderate intensity exercise per week. Discussed medication compliance, adverse effects. - lisinopril-hydrochlorothiazide (ZESTORETIC) 10-12.5 MG tablet; TAKE 1 TABLET BY MOUTH ONCE DAILY.  Dispense: 90 tablet; Refill: 1  4. Hypercholesterolemia LDL at goal but she does have slight hypertriglyceridemia Continue low-cholesterol diet, statin - pravastatin (PRAVACHOL) 40 MG tablet; Take 1 tablet (40 mg total) by mouth daily.  Dispense: 90 tablet; Refill: 1  5. Family history of cardiac disorder EKG reveals sinus arrhythmia, PVC Given family history of cardiac death I will refer to cardiology - Ambulatory referral to Cardiology  6. Sinus arrhythmia seen on electrocardiogram See #5 above - T4, free - TSH - T3 - Ambulatory referral to Cardiology  7.  Left knee pain Likely underlying osteoarthritis Continue ibuprofen Weight loss will be beneficial Use knee brace  Meds ordered this encounter  Medications   lisinopril-hydrochlorothiazide (ZESTORETIC) 10-12.5 MG tablet    Sig: TAKE 1 TABLET BY MOUTH ONCE DAILY.    Dispense:  90 tablet    Refill:  1   pravastatin (PRAVACHOL) 40 MG tablet    Sig: Take 1 tablet (40 mg total) by mouth daily.    Dispense:  90 tablet    Refill:  1    Follow-up: Return in about 6 months (around 07/18/2022) for Chronic medical conditions.       Charlott Rakes, MD, FAAFP. [facility] [place], [place]   01/17/2022, 11:41 AM

## 2022-01-16 NOTE — Progress Notes (Signed)
Concerns lisinopril and media Concerns about hip replacement and activity. Has a left leg discomfort

## 2022-01-17 ENCOUNTER — Ambulatory Visit: Payer: Medicare Other | Attending: Family Medicine

## 2022-01-17 ENCOUNTER — Encounter: Payer: Self-pay | Admitting: Family Medicine

## 2022-01-17 DIAGNOSIS — Z0000 Encounter for general adult medical examination without abnormal findings: Secondary | ICD-10-CM | POA: Diagnosis not present

## 2022-01-17 LAB — CMP14+EGFR
ALT: 8 IU/L (ref 0–32)
AST: 11 IU/L (ref 0–40)
Albumin/Globulin Ratio: 1.8 (ref 1.2–2.2)
Albumin: 4.6 g/dL (ref 3.9–4.9)
Alkaline Phosphatase: 120 IU/L (ref 44–121)
BUN/Creatinine Ratio: 16 (ref 12–28)
BUN: 17 mg/dL (ref 8–27)
Bilirubin Total: 0.2 mg/dL (ref 0.0–1.2)
CO2: 23 mmol/L (ref 20–29)
Calcium: 10.3 mg/dL (ref 8.7–10.3)
Chloride: 104 mmol/L (ref 96–106)
Creatinine, Ser: 1.09 mg/dL — ABNORMAL HIGH (ref 0.57–1.00)
Globulin, Total: 2.5 g/dL (ref 1.5–4.5)
Glucose: 114 mg/dL — ABNORMAL HIGH (ref 70–99)
Potassium: 4.4 mmol/L (ref 3.5–5.2)
Sodium: 143 mmol/L (ref 134–144)
Total Protein: 7.1 g/dL (ref 6.0–8.5)
eGFR: 58 mL/min/{1.73_m2} — ABNORMAL LOW (ref >59)

## 2022-01-17 LAB — T4, FREE: Free T4: 0.99 ng/dL (ref 0.82–1.77)

## 2022-01-17 LAB — CBC WITH DIFFERENTIAL/PLATELET
Basophils Absolute: 0.1 10*3/uL (ref 0.0–0.2)
Basos: 0 %
EOS (ABSOLUTE): 0.2 10*3/uL (ref 0.0–0.4)
Eos: 1 %
Hematocrit: 40.2 % (ref 34.0–46.6)
Hemoglobin: 13.1 g/dL (ref 11.1–15.9)
Immature Grans (Abs): 0 10*3/uL (ref 0.0–0.1)
Immature Granulocytes: 0 %
Lymphocytes Absolute: 5.1 10*3/uL — ABNORMAL HIGH (ref 0.7–3.1)
Lymphs: 35 %
MCH: 29.5 pg (ref 26.6–33.0)
MCHC: 32.6 g/dL (ref 31.5–35.7)
MCV: 91 fL (ref 79–97)
Monocytes Absolute: 1 10*3/uL — ABNORMAL HIGH (ref 0.1–0.9)
Monocytes: 7 %
Neutrophils Absolute: 8.2 10*3/uL — ABNORMAL HIGH (ref 1.4–7.0)
Neutrophils: 57 %
Platelets: 452 10*3/uL — ABNORMAL HIGH (ref 150–450)
RBC: 4.44 x10E6/uL (ref 3.77–5.28)
RDW: 13.2 % (ref 11.7–15.4)
WBC: 14.6 10*3/uL — ABNORMAL HIGH (ref 3.4–10.8)

## 2022-01-17 LAB — TSH: TSH: 1.85 u[IU]/mL (ref 0.450–4.500)

## 2022-01-17 LAB — T3: T3, Total: 129 ng/dL (ref 71–180)

## 2022-01-17 LAB — HEMOGLOBIN A1C
Est. average glucose Bld gHb Est-mCnc: 128 mg/dL
Hgb A1c MFr Bld: 6.1 % — ABNORMAL HIGH (ref 4.8–5.6)

## 2022-01-17 NOTE — Progress Notes (Signed)
Subjective:   Sherrelle Corleen Otwell is a 61 y.o. female who presents for Medicare Annual (Subsequent) preventive examination.  Review of Systems    connected with  Ms.Maricela Bo on 01/17/22 at 9:51 am  by telephone and verified that I am speaking with the correct person using two identifiers. I discussed the limitations, risks, security and privacy concerns of performing an evaluation and management service by telephone and the availability of in person appointments. I also discussed with the patient that there may be a patient responsible charge related to this service. The patient expressed understanding and agreed to proceed.  Patient location:  Home  My Location: [facility] Persons on the telephone call:   Myself Bethann Berkshire [place] ) and Ms.Maricela Bo       Objective:    Today's Vitals   01/17/22 0951  PainSc: 3    There is no height or weight on file to calculate BMI.     01/17/2022    9:55 AM 10/01/2021   10:23 AM 01/24/2021   10:24 AM 07/12/2020   10:57 AM 08/23/2019    9:59 AM 12/19/2016    9:05 AM 12/04/2016   10:19 AM  Advanced Directives  Does Patient Have a Medical Advance Directive? No No No No No No No  Would patient like information on creating a medical advance directive? Yes (ED - Information included in AVS) Yes (ED - Information included in AVS) No - Patient declined    No - Patient declined    Current Medications (verified) Outpatient Encounter Medications as of 01/17/2022  Medication Sig   Cholecalciferol (VITAMIN D3 PO) Take by mouth.   diclofenac Sodium (VOLTAREN) 1 % GEL Apply 2 grams topically 4 (four) times daily.   ibuprofen (ADVIL) 600 MG tablet Take 1 tablet (600 mg total) by mouth every 8 (eight) hours as needed.   lisinopril-hydrochlorothiazide (ZESTORETIC) 10-12.5 MG tablet TAKE 1 TABLET BY MOUTH ONCE DAILY.   pravastatin (PRAVACHOL) 40 MG tablet Take 1 tablet (40 mg total) by mouth daily.   triamcinolone cream (KENALOG) 0.1 %  Apply 1 application topically 2 (two) times daily.   No facility-administered encounter medications on file as of 01/17/2022.    Allergies (verified) Citrus, Hydrocodone, Metronidazole, Mobic [meloxicam], and Tetracyclines & related   History: Past Medical History:  Diagnosis Date   Active smoker    Allergy    Anemia    past hx    Back pain    Carpal tunnel syndrome    LEFT HAND   DJD (degenerative joint disease), lumbar    Hypercholesterolemia    Hypertension    Kidney stone    Pinched nerve in neck    Past Surgical History:  Procedure Laterality Date   APPENDECTOMY     BREAST BIOPSY Right 07/02/2018   COLONOSCOPY     POLYPECTOMY     ROTATOR CUFF REPAIR     TOTAL HIP ARTHROPLASTY Left 04/08/2016   Procedure: LEFT TOTAL HIP ARTHROPLASTY ANTERIOR APPROACH;  Surgeon: Paralee Cancel, MD;  Location: [facility];  Service: Orthopedics;  Laterality: Left;  Requests 70 mins   TOTAL HIP ARTHROPLASTY Right 07/08/2016   Procedure: RIGHT TOTAL HIP ARTHROPLASTY ANTERIOR APPROACH;  Surgeon: Paralee Cancel, MD;  Location: [facility];  Service: Orthopedics;  Laterality: Right;   Family History  Problem Relation Age of Onset   Diabetes Mother    Hypertension Mother    Heart failure Mother    Prostate cancer Father    Diabetes Father  Diabetes Sister    Diabetes Brother        3 brothers deceased - CHF   Healthy Sister    Breast cancer Maternal Grandmother    Colon cancer Neg Hx    Rectal cancer Neg Hx    Esophageal cancer Neg Hx    Liver cancer Neg Hx    Colon polyps Neg Hx    Social History   Socioeconomic History   Marital status: Single    Spouse name: Not on file   Number of children: 2   Years of education: Not on file   Highest education level: Not on file  Occupational History   Occupation: disabled  Tobacco Use   Smoking status: Every Day    Packs/day: 0.30    Types: Cigarettes    Last attempt to quit: 07/05/2016    Years since quitting: 5.5   Smokeless tobacco:  Never   Tobacco comments:    1 pack every 3 days  Substance and Sexual Activity   Alcohol use: Yes    Comment: occ    Drug use: Yes    Frequency: 3.0 times per week    Types: Marijuana    Comment: occasionally   Sexual activity: Yes    Birth control/protection: Surgical  Other Topics Concern   Not on file  Social History Narrative   Not on file   Social Determinants of Health   Financial Resource Strain: Low Risk  (10/01/2021)   Overall Financial Resource Strain (CARDIA)    Difficulty of Paying Living Expenses: Not very hard  Food Insecurity: No Food Insecurity (01/17/2022)   Hunger Vital Sign    Worried About Running Out of Food in the Last Year: Never true    Ran Out of Food in the Last Year: Never true  Transportation Needs: No Transportation Needs (10/01/2021)   PRAPARE - Hydrologist (Medical): No    Lack of Transportation (Non-Medical): No  Physical Activity: Insufficiently Active (10/01/2021)   Exercise Vital Sign    Days of Exercise per Week: 7 days    Minutes of Exercise per Session: 10 min  Stress: No Stress Concern Present (10/01/2021)   [place]    Feeling of Stress : Not at all  Social Connections: Socially Isolated (10/01/2021)   Social Connection and Isolation Panel [NHANES]    Frequency of Communication with Friends and Family: Three times a week    Frequency of Social Gatherings with Friends and Family: Twice a week    Attends Religious Services: Never    Marine scientist or Organizations: No    Attends Music therapist: Never    Marital Status: Never married    Tobacco Counseling Ready to quit: Not Answered Counseling given: Not Answered Tobacco comments: 1 pack every 3 days   Clinical Intake:     Pain : 0-10 Pain Score: 3  Pain Location: Knee     Diabetes: No     Diabetic? No         Activities of Daily Living     01/17/2022    9:56 AM 10/01/2021   10:24 AM  In your present state of health, do you have any difficulty performing the following activities:  Hearing? 1 0  Vision? 0 0  Difficulty concentrating or making decisions? 0 0  Walking or climbing stairs? 1 0  Dressing or bathing? 0 0  Doing errands, shopping? 0 0  Preparing Food and eating ? N N  Using the Toilet? N N  In the past six months, have you accidently leaked urine? N N  Do you have problems with loss of bowel control? N N  Managing your Medications? N N  Managing your Finances? N N  Housekeeping or managing your Housekeeping? N N    Patient Care Team: Charlott Rakes, MD as PCP - General (Family Medicine)  Indicate any recent Medical Services you may have received from other than [facility] providers in the past year (date may be approximate).     Assessment:   This is a routine wellness examination for Catrina.  Hearing/Vision screen No results found.  Dietary issues and exercise activities discussed:     Goals Addressed   None   Depression Screen    10/01/2021   10:24 AM 07/15/2021    8:44 AM 01/14/2021    9:38 AM 07/12/2020   10:57 AM 05/30/2020    1:51 PM 05/23/2020   11:24 AM 12/19/2019    9:26 AM  PHQ 2/9 Scores  PHQ - 2 Score 0 0 0 0 0 0 0  PHQ- 9 Score  0 0  0  0    Fall Risk    01/17/2022    9:56 AM 10/01/2021   10:23 AM 07/15/2021    8:44 AM 01/14/2021    9:30 AM 07/12/2020   10:57 AM  Fall Risk   Falls in the past year? 0 0 0 0 0  Number falls in past yr: 0 0 0 0 0  Injury with Fall? 0 0 0 0 0  Risk for fall due to : No Fall Risks No Fall Risks     Follow up  Education provided       FALL RISK PREVENTION PERTAINING TO THE HOME:  Any stairs in or around the home? Yes  If so, are there any without handrails? Yes  Home free of loose throw rugs in walkways, pet beds, electrical cords, etc? Yes  Adequate lighting in your home to reduce risk of falls? Yes   ASSISTIVE DEVICES UTILIZED TO PREVENT  FALLS:  Life alert? No  Use of a cane, walker or w/c? No  Grab bars in the bathroom? No  Shower chair or bench in shower? No  Elevated toilet seat or a handicapped toilet? No   TIMED UP AND GO:  Was the test performed? Yes .  Length of time to ambulate 10 feet:  sec.   Gait slow and steady without use of assistive device  Cognitive Function:    01/17/2022    9:58 AM 10/01/2021   10:29 AM  MMSE - Mini Mental State Exam  Not completed:  Unable to complete  Orientation to time 5   Orientation to Place 5   Registration 3   Attention/ Calculation 5   Recall 3   Language- name 2 objects 2   Language- repeat 1   Language- follow 3 step command 3   Language- read & follow direction 1   Write a sentence 1   Copy design 1   Total score 30         01/17/2022   10:01 AM 10/01/2021   10:29 AM  6CIT Screen  What Year? 0 points 0 points  What month? 0 points 0 points  What time? 0 points 0 points  Count back from 20 0 points 0 points  Months in reverse 0 points 0 points  Repeat phrase  0  points  Total Score  0 points    Immunizations Immunization History  Administered Date(s) Administered   PNEUMOCOCCAL CONJUGATE-20 07/12/2020    TDAP status: Will complete at next office visit  Flu Vaccine status: Declined, Education has been provided regarding the importance of this vaccine but patient still declined. Advised may receive this vaccine at local pharmacy or Health Dept. Aware to provide a copy of the vaccination record if obtained from local pharmacy or Health Dept. Verbalized acceptance and understanding.  Pneumococcal vaccine status: Up to date  Covid-19 vaccine status: Declined, Education has been provided regarding the importance of this vaccine but patient still declined. Advised may receive this vaccine at local pharmacy or Health Dept.or vaccine clinic. Aware to provide a copy of the vaccination record if obtained from local pharmacy or Health Dept. Verbalized  acceptance and understanding.  Qualifies for Shingles Vaccine? Yes   Zostavax completed No   Shingrix Completed?: No.    Education has been provided regarding the importance of this vaccine. Patient has been advised to call insurance company to determine out of pocket expense if they have not yet received this vaccine. Advised may also receive vaccine at local pharmacy or Health Dept. Verbalized acceptance and understanding.  Screening Tests Health Maintenance  Topic Date Due   COVID-19 Vaccine (1) Never done   DTaP/Tdap/Td (1 - Tdap) Never done   Zoster Vaccines- Shingrix (1 of 2) Never done   INFLUENZA VACCINE  05/04/2022 (Originally 09/03/2021)   Lung Cancer Screening  08/09/2022   Medicare Annual Wellness (AWV)  01/18/2023   PAP SMEAR-Modifier  07/13/2023   MAMMOGRAM  12/21/2023   COLONOSCOPY (Pts 45-70yr Insurance coverage will need to be confirmed)  05/26/2027   Hepatitis C Screening  Completed   HIV Screening  Completed   HPV VACCINES  Aged Out    Health Maintenance  Health Maintenance Due  Topic Date Due   COVID-19 Vaccine (1) Never done   DTaP/Tdap/Td (1 - Tdap) Never done   Zoster Vaccines- Shingrix (1 of 2) Never done    Colorectal cancer screening: Type of screening: Colonoscopy. Completed 05/25/20. Repeat every 7 years  Mammogram status: Completed 12/20/21. Repeat every year  Bone density status: Patient is not of age   Lung Cancer Screening: (Low Dose CT Chest recommended if Age 61-80years, 30 pack-year currently smoking OR have quit w/in 15years.) does qualify.   Lung Cancer Screening Referral: Completed on 08/08/21  Additional Screening:  Hepatitis C Screening: does qualify; Completed 08/26/2016  Vision Screening: Recommended annual ophthalmology exams for early detection of glaucoma and other disorders of the eye. Is the patient up to date with their annual eye exam?  Yes  Who is the provider or what is the name of the office in which the patient attends  annual eye exams?  If pt is not established with a provider, would they like to be referred to a provider to establish care?    .   Dental Screening: Recommended annual dental exams for proper oral hygiene  Community Resource Referral / Chronic Care Management: CRR required this visit?  No   CCM required this visit?  No      Plan:     I have personally reviewed and noted the following in the patient's chart:   Medical and social history Use of alcohol, tobacco or illicit drugs  Current medications and supplements including opioid prescriptions. Patient is not currently taking opioid prescriptions. Functional ability and status Nutritional status Physical activity Advanced  directives List of other physicians Hospitalizations, surgeries, and ER visits in previous 12 months Vitals Screenings to include cognitive, depression, and falls Referrals and appointments  In addition, I have reviewed and discussed with patient certain preventive protocols, quality metrics, and best practice recommendations. A written personalized care plan for preventive services as well as general preventive health recommendations were provided to patient.     Lillie Columbia, [place]   01/17/2022   Nurse Notes:

## 2022-01-17 NOTE — Patient Instructions (Addendum)
Reminder: Ask about the tetanus shot at your next visit

## 2022-01-21 ENCOUNTER — Ambulatory Visit: Payer: Medicare Other | Attending: Internal Medicine | Admitting: Internal Medicine

## 2022-01-21 ENCOUNTER — Encounter: Payer: Self-pay | Admitting: Internal Medicine

## 2022-01-21 VITALS — BP 132/86 | HR 84 | Ht 67.5 in | Wt 244.0 lb

## 2022-01-21 DIAGNOSIS — Z8249 Family history of ischemic heart disease and other diseases of the circulatory system: Secondary | ICD-10-CM | POA: Diagnosis not present

## 2022-01-21 NOTE — Patient Instructions (Signed)
Medication Instructions:  No Changes In Medications at this time.  *If you need a refill on your cardiac medications before your next appointment, please call your pharmacy*  Lab Work: None Ordered At This Time.  If you have labs (blood work) drawn today and your tests are completely normal, you will receive your results only by: [place] (if you have MyChart) OR A paper copy in the mail If you have any lab test that is abnormal or we need to change your treatment, we will call you to review the results.  Testing/Procedures: Your physician has requested that you have an echocardiogram. Echocardiography is a painless test that uses sound waves to create images of your heart. It provides your doctor with information about the size and shape of your heart and how well your heart's chambers and valves are working. You may receive an ultrasound enhancing agent through an IV if needed to better visualize your heart during the echo.This procedure takes approximately one hour. There are no restrictions for this procedure. This will take place at the [address], [address].   Follow-Up: At [facility], you and your health needs are our priority.  As part of our continuing mission to provide you with exceptional heart care, we have created designated Provider Care Teams.  These Care Teams include your primary Cardiologist (physician) and Advanced Practice Providers (APPs -  Physician Assistants and Nurse Practitioners) who all work together to provide you with the care you need, when you need it.  Your next appointment:   AS NEEDED   The format for your next appointment:   In Person  Provider:   Janina Mayo, MD     Other Instructions Please utilize your blood pressure cuff  and check blood pressures daily or a few times per week. If they are on average > 130/80 mmHg then let your PCP know.   We discussed considering wellbutrin to help you quit smoking, you can discuss  with your PCP

## 2022-01-21 NOTE — Progress Notes (Signed)
Cardiology Office Note:    Date:  01/21/2022   ID:  Inez Pilgrim, DOB 16-Aug-1960, MRN [id number]  PCP:  Charlott Rakes, MD   [place] Providers Cardiologist:  Janina Mayo, MD     Referring MD: Charlott Rakes, MD   No chief complaint on file. Abnormal EKG  History of Present Illness:    Kimberly Sosa is a 61 y.o. female with a hx of HTN, referral for sinus arrhythmia. .Feels mild palpitations at night.  She denies syncope, family hx of SCD. She has no known structural heart dx  She drinks caffeine. Occasional etoh. Minimal cigarette. Former med Designer, multimedia  She denies angina, dyspnea on exertion, no lower extremity edema, no report of PND or orthopnea.   She has an extensive family hx of CHF, see below    Past Medical History:  Diagnosis Date   Active smoker    Allergy    Anemia    past hx    Back pain    Carpal tunnel syndrome    LEFT HAND   DJD (degenerative joint disease), lumbar    Hypercholesterolemia    Hypertension    Kidney stone    Pinched nerve in neck     Past Surgical History:  Procedure Laterality Date   APPENDECTOMY     BREAST BIOPSY Right 07/02/2018   COLONOSCOPY     POLYPECTOMY     ROTATOR CUFF REPAIR     TOTAL HIP ARTHROPLASTY Left 04/08/2016   Procedure: LEFT TOTAL HIP ARTHROPLASTY ANTERIOR APPROACH;  Surgeon: Paralee Cancel, MD;  Location: [facility];  Service: Orthopedics;  Laterality: Left;  Requests 70 mins   TOTAL HIP ARTHROPLASTY Right 07/08/2016   Procedure: RIGHT TOTAL HIP ARTHROPLASTY ANTERIOR APPROACH;  Surgeon: Paralee Cancel, MD;  Location: [facility];  Service: Orthopedics;  Laterality: Right;    Current Medications: Current Meds  Medication Sig   Cholecalciferol (VITAMIN D3 PO) Take by mouth.   diclofenac Sodium (VOLTAREN) 1 % GEL Apply 2 grams topically 4 (four) times daily.   ibuprofen (ADVIL) 600 MG tablet Take 1 tablet (600 mg total) by mouth every 8 (eight) hours as needed.    lisinopril-hydrochlorothiazide (ZESTORETIC) 10-12.5 MG tablet TAKE 1 TABLET BY MOUTH ONCE DAILY.   pravastatin (PRAVACHOL) 40 MG tablet Take 1 tablet (40 mg total) by mouth daily.   triamcinolone cream (KENALOG) 0.1 % Apply 1 application topically 2 (two) times daily.     Allergies:   Citrus, Hydrocodone, Metronidazole, Mobic [meloxicam], and Tetracyclines & related   Social History   Socioeconomic History   Marital status: Single    Spouse name: Not on file   Number of children: 2   Years of education: Not on file   Highest education level: Not on file  Occupational History   Occupation: disabled  Tobacco Use   Smoking status: Every Day    Packs/day: 0.30    Types: Cigarettes    Last attempt to quit: 07/05/2016    Years since quitting: 5.5   Smokeless tobacco: Never   Tobacco comments:    1 pack every 3 days  Substance and Sexual Activity   Alcohol use: Yes    Comment: occ    Drug use: Yes    Frequency: 3.0 times per week    Types: Marijuana    Comment: occasionally   Sexual activity: Yes    Birth control/protection: Surgical  Other Topics Concern   Not on file  Social History Narrative   Not  on file   Social Determinants of Health   Financial Resource Strain: Low Risk  (10/01/2021)   Overall Financial Resource Strain (CARDIA)    Difficulty of Paying Living Expenses: Not very hard  Food Insecurity: No Food Insecurity (01/17/2022)   Hunger Vital Sign    Worried About Running Out of Food in the Last Year: Never true    Ran Out of Food in the Last Year: Never true  Transportation Needs: No Transportation Needs (10/01/2021)   PRAPARE - Hydrologist (Medical): No    Lack of Transportation (Non-Medical): No  Physical Activity: Insufficiently Active (10/01/2021)   Exercise Vital Sign    Days of Exercise per Week: 7 days    Minutes of Exercise per Session: 10 min  Stress: No Stress Concern Present (10/01/2021)   [place]    Feeling of Stress : Not at all  Social Connections: Socially Isolated (10/01/2021)   Social Connection and Isolation Panel [NHANES]    Frequency of Communication with Friends and Family: Three times a week    Frequency of Social Gatherings with Friends and Family: Twice a week    Attends Religious Services: Never    Marine scientist or Organizations: No    Attends Music therapist: Never    Marital Status: Never married     Family History: The patient's family history includes Breast cancer in her maternal grandmother; Diabetes in her brother, father, mother, and sister; Healthy in her sister; Heart failure in her mother; Hypertension in her mother; Prostate cancer in her father. There is no history of Colon cancer, Rectal cancer, Esophageal cancer, Liver cancer, or Colon polyps.  Father- CABG deceased Mother- CABG deceased Brother- ?MI deceased Brother- PPM, deceased at 77 Brother- CHF, 48 or 67 Sister- CHF    ROS:   Please see the history of present illness.     All other systems reviewed and are negative.  EKGs/Labs/Other Studies Reviewed:    The following studies were reviewed today:   EKG:  EKG is  ordered today.  The ekg ordered today demonstrates   01/21/2022- NSR with PACs  Recent Labs: 01/16/2022: ALT 8; BUN 17; Creatinine, Ser 1.09; Hemoglobin 13.1; Platelets 452; Potassium 4.4; Sodium 143; TSH 1.850   Recent Lipid Panel    Component Value Date/Time   CHOL 174 07/15/2021 0926   TRIG 186 (H) 07/15/2021 0926   HDL 54 07/15/2021 0926   CHOLHDL 2.7 07/12/2020 1158   LDLCALC 88 07/15/2021 0926     Risk Assessment/Calculations:     Physical Exam:    VS:   Vitals:   01/21/22 1111  BP: 132/86  Pulse: 84  SpO2: 98%     Wt Readings from Last 3 Encounters:  01/21/22 244 lb (110.7 kg)  01/16/22 244 lb (110.7 kg)  07/15/21 255 lb (115.7 kg)     GEN:  Well nourished, well  developed in no acute distress HEENT: Normal NECK: No JVD; No carotid bruits LYMPHATICS: No lymphadenopathy CARDIAC: RRR, no murmurs, rubs, gallops RESPIRATORY:  Clear to auscultation without rales, wheezing or rhonchi  ABDOMEN: Soft, non-tender, non-distended MUSCULOSKELETAL:  No edema; No deformity  SKIN: Warm and dry NEUROLOGIC:  Alert and oriented x 3 PSYCHIATRIC:  Normal affect   ASSESSMENT:    Sinus arrhythmia: This is benign. No further work up is indicated  Family Hx: strong family hx of CHF. Will obtain an echo  Mood/  Smoking cessation: recommend considering wellbutrin, we discuss this.  HTN: ok, on lisinopril-HCTz 10-12.5 mg daily. Recommend ambulatory BP PLAN:    In order of problems listed above:  TTE           Medication Adjustments/Labs and Tests Ordered: Current medicines are reviewed at length with the patient today.  Concerns regarding medicines are outlined above.  Orders Placed This Encounter  Procedures   EKG 12-Lead   ECHOCARDIOGRAM COMPLETE   No orders of the defined types were placed in this encounter.   Patient Instructions  Medication Instructions:  No Changes In Medications at this time.  *If you need a refill on your cardiac medications before your next appointment, please call your pharmacy*  Lab Work: None Ordered At This Time.  If you have labs (blood work) drawn today and your tests are completely normal, you will receive your results only by: [place] (if you have MyChart) OR A paper copy in the mail If you have any lab test that is abnormal or we need to change your treatment, we will call you to review the results.  Testing/Procedures: Your physician has requested that you have an echocardiogram. Echocardiography is a painless test that uses sound waves to create images of your heart. It provides your doctor with information about the size and shape of your heart and how well your heart's chambers and valves are working.  You may receive an ultrasound enhancing agent through an IV if needed to better visualize your heart during the echo.This procedure takes approximately one hour. There are no restrictions for this procedure. This will take place at the [address], [address].   Follow-Up: At [facility], you and your health needs are our priority.  As part of our continuing mission to provide you with exceptional heart care, we have created designated Provider Care Teams.  These Care Teams include your primary Cardiologist (physician) and Advanced Practice Providers (APPs -  Physician Assistants and Nurse Practitioners) who all work together to provide you with the care you need, when you need it.  Your next appointment:   AS NEEDED   The format for your next appointment:   In Person  Provider:   Janina Mayo, MD     Other Instructions Please utilize your blood pressure cuff  and check blood pressures daily or a few times per week. If they are on average > 130/80 mmHg then let your PCP know.   We discussed considering wellbutrin to help you quit smoking, you can discuss with your PCP    Signed, Janina Mayo, MD  01/21/2022 11:34 AM    [place]

## 2022-01-22 ENCOUNTER — Telehealth: Payer: Self-pay | Admitting: Emergency Medicine

## 2022-01-22 NOTE — Telephone Encounter (Signed)
Copied from [place] [id number]. Topic: General - Other >> Jan 22, 2022  9:28 AM Chapman Fitch wrote: Reason for CRM: Pt needs another paper for the handicap place card / please advise when she can pick this up

## 2022-01-24 NOTE — Telephone Encounter (Signed)
Pt called back for a status update on request

## 2022-01-24 NOTE — Telephone Encounter (Signed)
Pt was called and informed that paperwork is ready for pick up.

## 2022-02-04 ENCOUNTER — Other Ambulatory Visit: Payer: Self-pay

## 2022-02-06 ENCOUNTER — Other Ambulatory Visit: Payer: Self-pay

## 2022-02-13 ENCOUNTER — Other Ambulatory Visit: Payer: Self-pay

## 2022-02-13 ENCOUNTER — Ambulatory Visit (INDEPENDENT_AMBULATORY_CARE_PROVIDER_SITE_OTHER): Payer: 59 | Admitting: Primary Care

## 2022-02-13 ENCOUNTER — Ambulatory Visit: Payer: Self-pay

## 2022-02-13 NOTE — Telephone Encounter (Addendum)
Appointment with dr. Wynetta Emery 0/01/2023

## 2022-02-13 NOTE — Telephone Encounter (Signed)
  Chief Complaint: cyst  Symptoms: small cyst on L hand wrist area, dime size Frequency: 1-2 weeks  Pertinent Negatives: Patient denies pain, itching or redness Disposition: '[]'$ ED /'[]'$ Urgent Care (no appt availability in office) / '[]'$ Appointment(In office/virtual)/ '[]'$  [facility]/ '[]'$ Home Care/ '[]'$ Refused Recommended Disposition /'[x]'$ [facility]/ '[]'$  Follow-up with PCP Additional Notes: pt states she noticed area come up and unsure what it is but soft to touch and no pain. Pt just wanting it checked out of safe side. No appts currently until 03/10/22 so pt was told about location of [facility] on 02/17/22. Pt states she plans to go there right now if no cancellations come available.   Summary: soft knot on left hand wrist   Pt stated noticed a soft knot on her wrist on her left hand about a week ago or two. Pt stated it is not painful she is just concerned.  Pt seeking clinical advice.  No appointment are available.         Reason for Disposition  [1] Small swelling or lump AND [2] unexplained AND [3] present > 1 week  Answer Assessment - Initial Assessment Questions 1. APPEARANCE of SWELLING: "What does it look like?"     Small soft knot  2. SIZE: "How large is the swelling?" (e.g., inches, cm; or compare to size of pinhead, tip of pen, eraser, coin, pea, grape, ping pong ball)      Dime  3. LOCATION: "Where is the swelling located?"     L hand wrist area  4. ONSET: "When did the swelling start?"     1-2 weeks ago  5. COLOR: "What color is it?" "Is there more than one color?"     Skin colored  6. PAIN: "Is there any pain?" If Yes, ask: "How bad is the pain?" (e.g., scale 1-10; or mild, moderate, severe)     - NONE (0): no pain   - MILD (1-3): doesn't interfere with normal activities    - MODERATE (4-7): interferes with normal activities or awakens from sleep    - SEVERE (8-10): excruciating pain, unable to do any normal activities     No pan  Protocols used: Skin Lump or  Localized Swelling-A-AH

## 2022-02-14 ENCOUNTER — Telehealth (HOSPITAL_BASED_OUTPATIENT_CLINIC_OR_DEPARTMENT_OTHER): Payer: 59 | Admitting: Internal Medicine

## 2022-02-14 DIAGNOSIS — R2232 Localized swelling, mass and lump, left upper limb: Secondary | ICD-10-CM

## 2022-02-14 NOTE — Progress Notes (Signed)
Patient ID: Kimberly Sosa, female   DOB: [birth year], 62 y.o.   MRN: [id number] Virtual Visit via Video Note  I connected with Kimberly Sosa on 02/14/2022 at 8:18 AM by a video enabled telemedicine application and verified that I am speaking with the correct person using two identifiers.  Location: Patient: home Provider: Office   I discussed the limitations of evaluation and management by telemedicine and the availability of in person appointments. The patient expressed understanding and agreed to proceed.  History of Present Illness: 62 y.o. year old female with a history of hypertension, hypercholesterolemia, degenerative disease of the lumbar spine here   This is an urgent care visit. Patient had called in yesterday complaining of a knot on her left wrist that she was concerned about.  Patient states the knot is on the wrist distal to the thumb and has been present for about 2 weeks.  It is soft, nontender without any erythema.  It has not increased in size.  She thinks it is about the size of a dime.  It "pops up" when she uses her hand.   Observations/Objective: Older African-American female in no acute distress.  She was able to hold her hand up to the camera.  I see what appears to be circular soft tissue mass palmar surface of the wrist radial aspect.  Assessment and Plan: 1. Mass of left wrist Advised that this is likely a Baker's cyst.  These can regress spontaneously or can increase or stayed the same.  Advised that if she notices any changes like increased in size, pain or discomfort with movement of the wrist joint, we can refer her to orthopedics for further evaluation.  Patient states she will also go to the mobile unit on Monday to have it looked at in person.  She understands that if this changes, she will need to see orthopedics for which we can submit the referral once she let us know.   Follow Up Instructions: PRN   I discussed the assessment and treatment  plan with the patient. The patient was provided an opportunity to ask questions and all were answered. The patient agreed with the plan and demonstrated an understanding of the instructions.   The patient was advised to call back or seek an in-person evaluation if the symptoms worsen or if the condition fails to improve as anticipated.  I spent 10 minutes dedicated to the care of this patient on the date of this encounter to include previsit review of of record, face-to-face time with the patient discussing diagnosis and management.  This note has been created with Surveyor, quantity. Any transcriptional errors are unintentional.  Karle Plumber, MD

## 2022-02-21 ENCOUNTER — Ambulatory Visit (HOSPITAL_COMMUNITY): Payer: 59 | Attending: Cardiovascular Disease

## 2022-02-21 DIAGNOSIS — Z8249 Family history of ischemic heart disease and other diseases of the circulatory system: Secondary | ICD-10-CM | POA: Insufficient documentation

## 2022-02-21 DIAGNOSIS — I358 Other nonrheumatic aortic valve disorders: Secondary | ICD-10-CM | POA: Diagnosis not present

## 2022-02-21 LAB — ECHOCARDIOGRAM COMPLETE
AR max vel: 2.62 cm2
AV Area VTI: 2.44 cm2
AV Area mean vel: 2.42 cm2
AV Mean grad: 6 mmHg
AV Peak grad: 10.2 mmHg
Ao pk vel: 1.6 m/s
Area-P 1/2: 4.39 cm2
Calc EF: 58.8 %
MV M vel: 3.15 m/s
MV Peak grad: 39.8 mmHg
S' Lateral: 2.5 cm
Single Plane A2C EF: 60.8 %
Single Plane A4C EF: 59.4 %

## 2022-03-31 ENCOUNTER — Ambulatory Visit
Admission: RE | Admit: 2022-03-31 | Discharge: 2022-03-31 | Disposition: A | Discharge status: Home / Self Care | Payer: 59 | Source: Ambulatory Visit | Attending: Family Medicine | Admitting: Family Medicine

## 2022-03-31 ENCOUNTER — Other Ambulatory Visit: Payer: Self-pay

## 2022-03-31 DIAGNOSIS — E2839 Other primary ovarian failure: Secondary | ICD-10-CM

## 2022-03-31 DIAGNOSIS — Z780 Asymptomatic menopausal state: Secondary | ICD-10-CM | POA: Diagnosis not present

## 2022-04-01 ENCOUNTER — Other Ambulatory Visit: Payer: Self-pay | Admitting: Family Medicine

## 2022-04-01 ENCOUNTER — Other Ambulatory Visit: Payer: Self-pay

## 2022-04-01 DIAGNOSIS — M19072 Primary osteoarthritis, left ankle and foot: Secondary | ICD-10-CM

## 2022-04-01 MED ORDER — IBUPROFEN 600 MG PO TABS
600.0000 mg | ORAL_TABLET | Freq: Three times a day (TID) | ORAL | 2 refills | Status: DC | PRN
  Filled 2022-04-01: qty 60, 20d supply, fill #0
  Filled 2022-07-10: qty 60, 20d supply, fill #1
  Filled 2022-09-16: qty 60, 20d supply, fill #2

## 2022-04-01 NOTE — Telephone Encounter (Signed)
Requested Prescriptions  Pending Prescriptions Disp Refills   ibuprofen (ADVIL) 600 MG tablet 60 tablet 2    Sig: Take 1 tablet (600 mg total) by mouth every 8 (eight) hours as needed.     Analgesics:  NSAIDS Failed - 04/01/2022  9:14 AM      Failed - Manual Review: Labs are only required if the patient has taken medication for more than 8 weeks.      Failed - Cr in normal range and within 360 days    Creat  Date Value Ref Range Status  03/20/2016 0.83 0.50 - 1.05 mg/dL Final    Comment:      For patients > or = 62 years of age: The upper reference limit for Creatinine is approximately 13% higher for people identified as African-American.      Creatinine, Ser  Date Value Ref Range Status  01/16/2022 1.09 (H) 0.57 - 1.00 mg/dL Final   Creatinine, Urine  Date Value Ref Range Status  03/20/2016 150 20 - 320 mg/dL Final         Failed - PLT in normal range and within 360 days    Platelets  Date Value Ref Range Status  01/16/2022 452 (H) 150 - 450 x10E3/uL Final         Passed - HGB in normal range and within 360 days    Hemoglobin  Date Value Ref Range Status  01/16/2022 13.1 11.1 - 15.9 g/dL Final         Passed - HCT in normal range and within 360 days    Hematocrit  Date Value Ref Range Status  01/16/2022 40.2 34.0 - 46.6 % Final         Passed - eGFR is 30 or above and within 360 days    GFR, Est African American  Date Value Ref Range Status  03/20/2016 >89 >=60 mL/min Final   GFR calc Af Amer  Date Value Ref Range Status  12/19/2019 80 >59 mL/min/1.73 Final    Comment:    **In accordance with recommendations from the NKF-ASN Task force,**   [facility] is in the process of updating its eGFR calculation to the   2021 CKD-EPI creatinine equation that estimates kidney function   without a race variable.    GFR, Est Non African American  Date Value Ref Range Status  03/20/2016 80 >=60 mL/min Final   GFR calc non Af Amer  Date Value Ref Range Status   12/19/2019 69 >59 mL/min/1.73 Final   eGFR  Date Value Ref Range Status  01/16/2022 58 (L) >59 mL/min/1.73 Final         Passed - Patient is not pregnant      Passed - Valid encounter within last 12 months    Recent Outpatient Visits           1 month ago Mass of left wrist   [place] Ladell Pier, MD   2 months ago Leukocytosis, unspecified type   [place], Charlane Ferretti, MD   8 months ago Smoking greater than 20 pack years   [place] Charlott Rakes, MD   1 year ago Prediabetes   [place] Charlott Rakes, MD   1 year ago Sciatica of right side   [place], Enobong, MD       Future Appointments  In 3 months Charlott Rakes, MD [place]

## 2022-04-01 NOTE — Telephone Encounter (Signed)
Medication Refill - Medication: ibuprofen (ADVIL) 600 MG tablet   Has the patient contacted their pharmacy? Yes.   (Agent: If no, request that the patient contact the pharmacy for the refill. If patient does not wish to contact the pharmacy document the reason why and proceed with request.) (Agent: If yes, when and what did the pharmacy advise?)  Preferred Pharmacy (with phone number or street name):  [place] Phone: [phone]  Fax: [phone]     Has the patient been seen for an appointment in the last year OR does the patient have an upcoming appointment? Yes.    Agent: Please be advised that RX refills may take up to 3 business days. We ask that you follow-up with your pharmacy.

## 2022-04-21 ENCOUNTER — Other Ambulatory Visit: Payer: Self-pay

## 2022-07-10 ENCOUNTER — Other Ambulatory Visit: Payer: Self-pay

## 2022-07-21 ENCOUNTER — Ambulatory Visit: Payer: Medicare Other | Admitting: Family Medicine

## 2022-07-23 ENCOUNTER — Encounter: Payer: Self-pay | Admitting: Family Medicine

## 2022-07-23 ENCOUNTER — Ambulatory Visit: Payer: 59 | Attending: Family Medicine | Admitting: Family Medicine

## 2022-07-23 ENCOUNTER — Other Ambulatory Visit: Payer: Self-pay

## 2022-07-23 VITALS — BP 134/79 | HR 82 | Temp 98.0°F | Ht 67.0 in | Wt 247.2 lb

## 2022-07-23 DIAGNOSIS — I10 Essential (primary) hypertension: Secondary | ICD-10-CM | POA: Diagnosis not present

## 2022-07-23 DIAGNOSIS — F17200 Nicotine dependence, unspecified, uncomplicated: Secondary | ICD-10-CM | POA: Insufficient documentation

## 2022-07-23 DIAGNOSIS — F17210 Nicotine dependence, cigarettes, uncomplicated: Secondary | ICD-10-CM | POA: Diagnosis not present

## 2022-07-23 DIAGNOSIS — M1711 Unilateral primary osteoarthritis, right knee: Secondary | ICD-10-CM

## 2022-07-23 DIAGNOSIS — M47816 Spondylosis without myelopathy or radiculopathy, lumbar region: Secondary | ICD-10-CM | POA: Diagnosis not present

## 2022-07-23 DIAGNOSIS — L80 Vitiligo: Secondary | ICD-10-CM | POA: Diagnosis not present

## 2022-07-23 DIAGNOSIS — E7800 Pure hypercholesterolemia, unspecified: Secondary | ICD-10-CM | POA: Diagnosis not present

## 2022-07-23 DIAGNOSIS — R7303 Prediabetes: Secondary | ICD-10-CM

## 2022-07-23 MED ORDER — NICOTINE 7 MG/24HR TD PT24
7.0000 mg | MEDICATED_PATCH | Freq: Every day | TRANSDERMAL | 3 refills | Status: DC
Start: 2022-07-23 — End: 2023-09-24
  Filled 2022-07-23: qty 28, 28d supply, fill #0

## 2022-07-23 MED ORDER — PRAVASTATIN SODIUM 40 MG PO TABS
40.0000 mg | ORAL_TABLET | Freq: Every day | ORAL | 1 refills | Status: DC
  Filled 2022-07-23 – 2022-10-07 (×2): qty 90, 90d supply, fill #0
  Filled 2023-01-08: qty 90, 90d supply, fill #1

## 2022-07-23 MED ORDER — LISINOPRIL-HYDROCHLOROTHIAZIDE 10-12.5 MG PO TABS
1.0000 | ORAL_TABLET | Freq: Every day | ORAL | 1 refills | Status: DC
  Filled 2022-07-23: qty 90, 90d supply, fill #0
  Filled 2022-10-07: qty 90, 90d supply, fill #1

## 2022-07-23 MED ORDER — CYCLOBENZAPRINE HCL 10 MG PO TABS
10.0000 mg | ORAL_TABLET | Freq: Every evening | ORAL | 1 refills | Status: AC | PRN
  Filled 2022-07-23: qty 90, 90d supply, fill #0

## 2022-07-23 NOTE — Patient Instructions (Signed)
Vitiligo Vitiligo is a long-term, or chronic, skin disease that causes loss of skin color in patches (depigmentation). In this condition, areas of the skin that lose pigment turn milky white. This condition affects all types of skin but is most apparent in people with darker skin. Some people may also lose color in their hair or eyes or inside their mouths. Vitiligo usually starts before age 62, but it can occur at any age. There are two main types of vitiligo: Nonsegmental or bilateral vitiligo. This is the more common type. It affects both sides of the body, usually on the face, hands, arms, knees, or feet. This type starts suddenly and may come and go over your lifetime. Segmental or unilateral vitiligo. This type happens on only one side of the body, often on the face, arm, or leg. Some people also lose hair color. This type may start at an early age, get worse for a while, and then stop. Vitiligo is not spread from person to person. It is not dangerous to your health but can cause embarrassment and emotional stress. Some people with this condition may have a higher risk for hearing or vision loss. What are the causes? The exact cause of vitiligo is unknown. It may be an autoimmune disease. This is a type of disease that makes your body's defense system (immune system) mistakenly attack normal cells instead of germs. If your immune system attacks color-producing cells (melanocytes), it may cause vitiligo. When these cells die, you lose color. Sometimes, vitiligo is passed down through families, so it may also be a genetic condition. What increases the risk? The following factors may make you more likely to develop this condition: Having a family history of the condition. Having another autoimmune disease, such as rheumatoid arthritis, autoimmune thyroid disease, or type 1 diabetes. What are the signs or symptoms?  The only sign of this condition is a loss of color in your skin, hair, or eye, or  inside of your mouth. How is this diagnosed? This condition may be diagnosed by patches of depigmentation on your skin. You may also have blood tests to check for other types of autoimmune disease. How is this treated? There is no cure for this condition, and it is not dangerous to your health. However, you may choose to treat vitiligo if having it makes you feel uncomfortable or stressed about your appearance. Treatment will not cure your condition, but it may restore some skin color or prevent your condition from getting worse. Treatment options include: Makeup or self-tanning lotions. These can add color in depigmented areas. Hair dye may be used for white patches of hair. Skin creams, such as steroid creams, vitamin D creams, and creams that block the immune system. Ultraviolet light treatment. Medicines that cause depigmentation. These may be used to lighten darker skin so that white patches will be less visible. Surgery to remove pigmented skin from one area and place it in depigmented areas (skin grafting) or to add color with surgical tattooing. Follow these instructions at home: Take over-the-counter and prescription medicines only as told by your health care provider. Protect your skin from the sun. Sunburn can make vitiligo worse. Use sunscreen with an SPF of 30 or higher. Cover areas of affected skin with clothing or wear a hat when you are out in the sun. Manage stress. Stress can make this condition worse. Seek counseling and support if having vitiligo makes you feel anxious or depressed. Keep all follow-up visits. This is important. Contact a   health care provider if: Your condition is getting worse. You are struggling with depression or anxiety. You notice any changes in hearing or vision. Get help right away if: Your condition is quickly getting worse. If you ever feel like you may hurt yourself or others, or have thoughts about taking your own life, get help right away. Go to  your nearest emergency department or: Call your local emergency services (911 in the [place] Call a suicide crisis helpline, such as the National Suicide Prevention Lifeline at 1-800-273-8255 or 988 in the [place] This is open 24 hours a day in the [place] Text the Crisis Text Line at 741741 (in the [place] Summary Vitiligo is a long-term (chronic) skin disease that causes loss of skin color in patches. Some people with this condition may also lose color in their hair or eyes or inside their mouths. This condition is diagnosed by the signs of depigmentation. Blood testing may be done to look for other types of autoimmune disease. There is no cure for this condition, and it is not dangerous to your health. If your condition makes you feel uncomfortable or stressed about your appearance, you may consider treatment, such as skin creams or medicines. This information is not intended to replace advice given to you by your health care provider. Make sure you discuss any questions you have with your health care provider. Document Revised: 08/15/2020 Document Reviewed: 07/11/2019 Elsevier Patient Education  2024 Elsevier Inc.  

## 2022-07-23 NOTE — Progress Notes (Signed)
Back spasms wants to restart tramadol Knot on inside of wrist Discoloration of skin on forehead.

## 2022-07-23 NOTE — Progress Notes (Signed)
Subjective:  Patient ID: Kimberly Sosa, female    DOB: 29-Jul-1960  Age: 62 y.o. MRN: [id number]  CC: Hypertension   HPI Kimberly Sosa is a 62 y.o. year old female with a history of  hypertension, hypercholesterolemia, degenerative disease of the lumbar spine, nicotine dependence.  Interval History:  She uses a right knee brace and Ibuprofen for it and these have been beneficial. She would like some Tramadol for back spasms as Ibuprofen has been ineffective. She states she does not want knee surgery. She has noticed swellings in her left wrist.  She does not want any medications  to quit smoking as she would like to do it on her own. She is open to using Nicoderm and has cut back to 5 Cigarettes/day. She uses THC as it helps with back spasm. She is requesting dermatology referral for evaluation of her vitiligo as it has become more pronounced since she returned from the beach. Endorses adherence with her antihypertensive and her statin. Past Medical History:  Diagnosis Date   Active smoker    Allergy    Anemia    past hx    Back pain    Carpal tunnel syndrome    LEFT HAND   DJD (degenerative joint disease), lumbar    Hypercholesterolemia    Hypertension    Kidney stone    Pinched nerve in neck     Past Surgical History:  Procedure Laterality Date   APPENDECTOMY     BREAST BIOPSY Right 07/02/2018   COLONOSCOPY     POLYPECTOMY     ROTATOR CUFF REPAIR     TOTAL HIP ARTHROPLASTY Left 04/08/2016   Procedure: LEFT TOTAL HIP ARTHROPLASTY ANTERIOR APPROACH;  Surgeon: Durene Romans, MD;  Location: [facility];  Service: Orthopedics;  Laterality: Left;  Requests 70 mins   TOTAL HIP ARTHROPLASTY Right 07/08/2016   Procedure: RIGHT TOTAL HIP ARTHROPLASTY ANTERIOR APPROACH;  Surgeon: Durene Romans, MD;  Location: [facility];  Service: Orthopedics;  Laterality: Right;    Family History  Problem Relation Age of Onset   Diabetes Mother    Hypertension Mother    Heart failure  Mother    Prostate cancer Father    Diabetes Father    Diabetes Sister    Diabetes Brother        3 brothers deceased - CHF   Healthy Sister    Breast cancer Maternal Grandmother    Colon cancer Neg Hx    Rectal cancer Neg Hx    Esophageal cancer Neg Hx    Liver cancer Neg Hx    Colon polyps Neg Hx     Social History   Socioeconomic History   Marital status: Single    Spouse name: Not on file   Number of children: 2   Years of education: Not on file   Highest education level: Not on file  Occupational History   Occupation: disabled  Tobacco Use   Smoking status: Every Day    Packs/day: .3    Types: Cigarettes    Last attempt to quit: 07/05/2016    Years since quitting: 6.0   Smokeless tobacco: Never   Tobacco comments:    1 pack every 3 days  Substance and Sexual Activity   Alcohol use: Yes    Comment: occ    Drug use: Yes    Frequency: 3.0 times per week    Types: Marijuana    Comment: occasionally   Sexual activity: Yes    Birth  control/protection: Surgical  Other Topics Concern   Not on file  Social History Narrative   Not on file   Social Determinants of Health   Financial Resource Strain: Low Risk  (10/01/2021)   Overall Financial Resource Strain (CARDIA)    Difficulty of Paying Living Expenses: Not very hard  Food Insecurity: No Food Insecurity (01/17/2022)   Hunger Vital Sign    Worried About Running Out of Food in the Last Year: Never true    Ran Out of Food in the Last Year: Never true  Transportation Needs: No Transportation Needs (10/01/2021)   PRAPARE - Administrator, Civil Service (Medical): No    Lack of Transportation (Non-Medical): No  Physical Activity: Insufficiently Active (10/01/2021)   Exercise Vital Sign    Days of Exercise per Week: 7 days    Minutes of Exercise per Session: 10 min  Stress: No Stress Concern Present (10/01/2021)   [employer] of Occupational Health - Occupational Stress Questionnaire    Feeling of  Stress : Not at all  Social Connections: Socially Isolated (10/01/2021)   Social Connection and Isolation Panel [NHANES]    Frequency of Communication with Friends and Family: Three times a week    Frequency of Social Gatherings with Friends and Family: Twice a week    Attends Religious Services: Never    Database administrator or Organizations: No    Attends Engineer, structural: Never    Marital Status: Never married    Allergies  Allergen Reactions   Citrus Itching    Peaches  Oranges    Hydrocodone Other (See Comments)    Causes Headaches   Metronidazole Itching   Mobic [Meloxicam] Other (See Comments)    Causes Headaches   Tetracyclines & Related Swelling    All over body swelling    Outpatient Medications Prior to Visit  Medication Sig Dispense Refill   Cholecalciferol (VITAMIN D3 PO) Take by mouth.     diclofenac Sodium (VOLTAREN) 1 % GEL Apply 2 grams topically 4 (four) times daily. 100 g 1   ibuprofen (ADVIL) 600 MG tablet Take 1 tablet (600 mg total) by mouth every 8 (eight) hours as needed. 60 tablet 2   triamcinolone cream (KENALOG) 0.1 % Apply 1 application topically 2 (two) times daily. 30 g 1   lisinopril-hydrochlorothiazide (ZESTORETIC) 10-12.5 MG tablet TAKE 1 TABLET BY MOUTH ONCE DAILY. 90 tablet 1   pravastatin (PRAVACHOL) 40 MG tablet Take 1 tablet (40 mg total) by mouth daily. 90 tablet 1   No facility-administered medications prior to visit.     ROS Review of Systems  Constitutional:  Negative for activity change and appetite change.  HENT:  Negative for sinus pressure and sore throat.   Respiratory:  Negative for chest tightness, shortness of breath and wheezing.   Cardiovascular:  Negative for chest pain and palpitations.  Gastrointestinal:  Negative for abdominal distention, abdominal pain and constipation.  Genitourinary: Negative.   Musculoskeletal: Negative.   Psychiatric/Behavioral:  Negative for behavioral problems and dysphoric  mood.     Objective:  BP 134/79   Pulse 82   Temp 98 F (36.7 C) (Oral)   Ht 5\' 7"  (1.702 m)   Wt 247 lb 3.2 oz (112.1 kg)   SpO2 100%   BMI 38.72 kg/m      07/23/2022   11:18 AM 07/23/2022   10:51 AM 01/21/2022   11:11 AM  BP/Weight  Systolic BP 134 144 132  Diastolic BP  79 81 86  Wt. (Lbs)  247.2 244  BMI  38.72 kg/m2 37.65 kg/m2      Physical Exam Constitutional:      Appearance: She is well-developed.  Cardiovascular:     Rate and Rhythm: Normal rate.     Heart sounds: Normal heart sounds. No murmur heard. Pulmonary:     Effort: Pulmonary effort is normal.     Breath sounds: Normal breath sounds. No wheezing or rales.  Chest:     Chest wall: No tenderness.  Abdominal:     General: Bowel sounds are normal. There is no distension.     Palpations: Abdomen is soft. There is no mass.     Tenderness: There is no abdominal tenderness.  Musculoskeletal:        General: Normal range of motion.     Right lower leg: No edema.     Left lower leg: No edema.     Comments: Crepitus on range of motion of right knee. Knee brace on right knee.  Neurological:     Mental Status: She is alert and oriented to person, place, and time.  Psychiatric:        Mood and Affect: Mood normal.        Latest Ref Rng & Units 01/16/2022    3:26 PM 07/15/2021    9:26 AM 01/14/2021   10:09 AM  CMP  Glucose 70 - 99 mg/dL 098  119  94   BUN 8 - 27 mg/dL 17  26  20    Creatinine 0.57 - 1.00 mg/dL 1.47  8.29  5.62   Sodium 134 - 144 mmol/L 143  146  142   Potassium 3.5 - 5.2 mmol/L 4.4  4.5  4.5   Chloride 96 - 106 mmol/L 104  109  104   CO2 20 - 29 mmol/L 23  21  22    Calcium 8.7 - 10.3 mg/dL 13.0  86.5  78.4   Total Protein 6.0 - 8.5 g/dL 7.1  6.7  6.4   Total Bilirubin 0.0 - 1.2 mg/dL 0.2  0.3  0.2   Alkaline Phos 44 - 121 IU/L 120  112  113   AST 0 - 40 IU/L 11  12  17    ALT 0 - 32 IU/L 8  12  16      Lipid Panel     Component Value Date/Time   CHOL 174 07/15/2021 0926    TRIG 186 (H) 07/15/2021 0926   HDL 54 07/15/2021 0926   CHOLHDL 2.7 07/12/2020 1158   LDLCALC 88 07/15/2021 0926    CBC    Component Value Date/Time   WBC 14.6 (H) 01/16/2022 1526   WBC 14.2 (H) 07/09/2016 0509   RBC 4.44 01/16/2022 1526   RBC 3.10 (L) 07/09/2016 0509   HGB 13.1 01/16/2022 1526   HCT 40.2 01/16/2022 1526   PLT 452 (H) 01/16/2022 1526   MCV 91 01/16/2022 1526   MCH 29.5 01/16/2022 1526   MCH 29.0 07/09/2016 0509   MCHC 32.6 01/16/2022 1526   MCHC 33.0 07/09/2016 0509   RDW 13.2 01/16/2022 1526   LYMPHSABS 5.1 (H) 01/16/2022 1526   EOSABS 0.2 01/16/2022 1526   BASOSABS 0.1 01/16/2022 1526    Lab Results  Component Value Date   HGBA1C 6.1 (H) 01/16/2022    Assessment & Plan:  1. Vitiligo Symptoms have worsened per patient - Ambulatory referral to Dermatology  2. Cigarette nicotine dependence without complication Spent 3 minutes counseling on smoking  cessation and hazardous effect of smoking.  She would like to work on quitting on her own and declines medications but is open to nicotine patches - nicotine (NICODERM CQ) 7 mg/24hr patch; Place 1 patch (7 mg total) onto the skin daily.  Dispense: 28 patch; Refill: 3  3. Essential hypertension Controlled Continue current regimen Counseled on blood pressure goal of less than 130/80, low-sodium, DASH diet, medication compliance, 150 minutes of moderate intensity exercise per week. Discussed medication compliance, adverse effects. - CMP14+EGFR; Future - lisinopril-hydrochlorothiazide (ZESTORETIC) 10-12.5 MG tablet; TAKE 1 TABLET BY MOUTH ONCE DAILY.  Dispense: 90 tablet; Refill: 1  4. Hypercholesterolemia Controlled Low-cholesterol diet - LP+Non-HDL Cholesterol; Future - pravastatin (PRAVACHOL) 40 MG tablet; Take 1 tablet (40 mg total) by mouth daily.  Dispense: 90 tablet; Refill: 1  5. Prediabetes Labs reveal prediabetes with an A1c of 6.1.  Working on a low carbohydrate diet, exercise, weight loss is  recommended in order to prevent progression to type 2 diabetes mellitus.  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  Dispense: 90 tablet; Refill: 1  7. Primary osteoarthritis of right knee Controlled on ibuprofen Continue to use knee brace She declines referral for cortisone injections   Health Care Maintenance: Up-to-date Low-dose CT for lung cancer screening due in 08/2022. Meds ordered this encounter  Medications   cyclobenzaprine (FLEXERIL) 10 MG tablet    Sig: Take 1 tablet (10 mg total) by mouth at bedtime as needed for muscle spasms.    Dispense:  90 tablet    Refill:  1   lisinopril-hydrochlorothiazide (ZESTORETIC) 10-12.5 MG tablet    Sig: TAKE 1 TABLET BY MOUTH ONCE DAILY.    Dispense:  90 tablet    Refill:  1   pravastatin (PRAVACHOL) 40 MG tablet    Sig: Take 1 tablet (40 mg total) by mouth daily.    Dispense:  90 tablet    Refill:  1   nicotine (NICODERM CQ) 7 mg/24hr patch    Sig: Place 1 patch (7 mg total) onto the skin daily.    Dispense:  28 patch    Refill:  3    Follow-up: Return in about 6 months (around 01/22/2023) for Chronic medical conditions.       Hoy Register, MD, FAAFP. [facility] [place], [place] [phone]   07/23/2022, 11:57 AM

## 2022-07-24 ENCOUNTER — Ambulatory Visit: Payer: 59 | Attending: Family Medicine

## 2022-07-24 DIAGNOSIS — R7303 Prediabetes: Secondary | ICD-10-CM | POA: Diagnosis not present

## 2022-07-24 DIAGNOSIS — I10 Essential (primary) hypertension: Secondary | ICD-10-CM | POA: Diagnosis not present

## 2022-07-24 DIAGNOSIS — E7800 Pure hypercholesterolemia, unspecified: Secondary | ICD-10-CM

## 2022-07-25 LAB — CMP14+EGFR
ALT: 9 IU/L (ref 0–32)
AST: 10 IU/L (ref 0–40)
Albumin: 4.2 g/dL (ref 3.9–4.9)
Alkaline Phosphatase: 117 IU/L (ref 44–121)
BUN/Creatinine Ratio: 19 (ref 12–28)
BUN: 18 mg/dL (ref 8–27)
Bilirubin Total: <0.2 mg/dL (ref 0.0–1.2)
CO2: 21 mmol/L (ref 20–29)
Calcium: 9.6 mg/dL (ref 8.7–10.3)
Chloride: 109 mmol/L — ABNORMAL HIGH (ref 96–106)
Creatinine, Ser: 0.96 mg/dL (ref 0.57–1.00)
Globulin, Total: 2.3 g/dL (ref 1.5–4.5)
Glucose: 97 mg/dL (ref 70–99)
Potassium: 4.6 mmol/L (ref 3.5–5.2)
Sodium: 144 mmol/L (ref 134–144)
Total Protein: 6.5 g/dL (ref 6.0–8.5)
eGFR: 67 mL/min/{1.73_m2} (ref >59)

## 2022-07-25 LAB — LP+NON-HDL CHOLESTEROL
Cholesterol, Total: 160 mg/dL (ref 100–199)
HDL: 61 mg/dL (ref >39)
LDL Chol Calc (NIH): 86 mg/dL (ref 0–99)
Total Non-HDL-Chol (LDL+VLDL): 99 mg/dL (ref 0–129)
Triglycerides: 68 mg/dL (ref 0–149)
VLDL Cholesterol Cal: 13 mg/dL (ref 5–40)

## 2022-07-25 LAB — HEMOGLOBIN A1C
Est. average glucose Bld gHb Est-mCnc: 134 mg/dL
Hgb A1c MFr Bld: 6.3 % — ABNORMAL HIGH (ref 4.8–5.6)

## 2022-09-09 NOTE — Progress Notes (Unsigned)
Subjective:   Arlissa Emrey Bero is a 62 y.o. female who presents for Medicare Annual (Subsequent) preventive examination.  Visit Complete: {VISITMETHOD:[phone]}  Patient Medicare AWV questionnaire was completed by the patient on ***; I have confirmed that all information answered by patient is correct and no changes since this date.  Vital Signs: {telehealth vitals:30100}  Review of Systems    ***       Objective:    There were no vitals filed for this visit. There is no height or weight on file to calculate BMI.     01/17/2022    9:55 AM 10/01/2021   10:23 AM 01/24/2021   10:24 AM 07/12/2020   10:57 AM 08/23/2019    9:59 AM 12/19/2016    9:05 AM 12/04/2016   10:19 AM  Advanced Directives  Does Patient Have a Medical Advance Directive? No No No No No No No  Would patient like information on creating a medical advance directive? Yes (ED - Information included in AVS) Yes (ED - Information included in AVS) No - Patient declined    No - Patient declined    Current Medications (verified) Outpatient Encounter Medications as of 09/10/2022  Medication Sig   Cholecalciferol (VITAMIN D3 PO) Take by mouth.   cyclobenzaprine (FLEXERIL) 10 MG tablet Take 1 tablet (10 mg total) by mouth at bedtime as needed for muscle spasms.   diclofenac Sodium (VOLTAREN) 1 % GEL Apply 2 grams topically 4 (four) times daily.   ibuprofen (ADVIL) 600 MG tablet Take 1 tablet (600 mg total) by mouth every 8 (eight) hours as needed.   lisinopril-hydrochlorothiazide (ZESTORETIC) 10-12.5 MG tablet TAKE 1 TABLET BY MOUTH ONCE DAILY.   nicotine (NICODERM CQ) 7 mg/24hr patch Place 1 patch (7 mg total) onto the skin daily.   pravastatin (PRAVACHOL) 40 MG tablet Take 1 tablet (40 mg total) by mouth daily.   triamcinolone cream (KENALOG) 0.1 % Apply 1 application topically 2 (two) times daily.   No facility-administered encounter medications on file as of 09/10/2022.    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   Procedure: LEFT TOTAL HIP ARTHROPLASTY ANTERIOR APPROACH;  Surgeon: Durene Romans, MD;  Location: [facility];  Service: Orthopedics;  Laterality: Left;  Requests 70 mins   TOTAL HIP ARTHROPLASTY Right 07/08/2016   Procedure: RIGHT TOTAL HIP ARTHROPLASTY ANTERIOR APPROACH;  Surgeon: Durene Romans, MD;  Location: [facility];  Service: Orthopedics;  Laterality: Right;   Family History  Problem Relation Age of Onset   Diabetes Mother    Hypertension Mother    Heart failure Mother    Prostate cancer Father    Diabetes Father    Diabetes Sister    Diabetes Brother        3 brothers deceased - CHF   Healthy Sister  Breast cancer Maternal Grandmother    Colon cancer Neg Hx    Rectal cancer Neg Hx    Esophageal cancer Neg Hx    Liver cancer Neg Hx    Colon polyps Neg Hx    Social History   Socioeconomic History   Marital status: Single    Spouse name: Not on file   Number of children: 2   Years of education: Not on file   Highest education level: Not on file  Occupational History   Occupation: disabled  Tobacco Use   Smoking status: Every Day    Current packs/day: 0.00    Types: Cigarettes    Last attempt to quit: 07/05/2016    Years since quitting: 6.1   Smokeless tobacco: Never   Tobacco comments:    1 pack every 3 days  Substance and Sexual Activity   Alcohol use: Yes    Comment: occ    Drug use: Yes    Frequency: 3.0  times per week    Types: Marijuana    Comment: occasionally   Sexual activity: Yes    Birth control/protection: Surgical  Other Topics Concern   Not on file  Social History Narrative   Not on file   Social Determinants of Health   Financial Resource Strain: Low Risk  (10/01/2021)   Overall Financial Resource Strain (CARDIA)    Difficulty of Paying Living Expenses: Not very hard  Food Insecurity: No Food Insecurity (01/17/2022)   Hunger Vital Sign    Worried About Running Out of Food in the Last Year: Never true    Ran Out of Food in the Last Year: Never true  Transportation Needs: No Transportation Needs (10/01/2021)   PRAPARE - Administrator, Civil Service (Medical): No    Lack of Transportation (Non-Medical): No  Physical Activity: Insufficiently Active (10/01/2021)   Exercise Vital Sign    Days of Exercise per Week: 7 days    Minutes of Exercise per Session: 10 min  Stress: No Stress Concern Present (10/01/2021)   [employer] of Occupational Health - Occupational Stress Questionnaire    Feeling of Stress : Not at all  Social Connections: Socially Isolated (10/01/2021)   Social Connection and Isolation Panel [NHANES]    Frequency of Communication with Friends and Family: Three times a week    Frequency of Social Gatherings with Friends and Family: Twice a week    Attends Religious Services: Never    Database administrator or Organizations: No    Attends Engineer, structural: Never    Marital Status: Never married    Tobacco Counseling Ready to quit: Not Answered Counseling given: Not Answered Tobacco comments: 1 pack every 3 days   Clinical Intake:                        Activities of Daily Living    01/17/2022    9:56 AM 10/01/2021   10:24 AM  In your present state of health, do you have any difficulty performing the following activities:  Hearing? 1 0  Vision? 0 0  Difficulty concentrating or making decisions? 0 0   Walking or climbing stairs? 1 0  Dressing or bathing? 0 0  Doing errands, shopping? 0 0  Preparing Food and eating ? N N  Using the Toilet? N N  In the past six months, have you accidently leaked urine? N N  Do you have problems with loss of bowel  control? N N  Managing your Medications? N N  Managing your Finances? N N  Housekeeping or managing your Housekeeping? N N    Patient Care Team: Hoy Register, MD as PCP - General (Family Medicine) Maisie Fus, MD as PCP - Cardiology (Cardiology)  Indicate any recent Medical Services you may have received from other than [facility] providers in the past year (date may be approximate).     Assessment:   This is a routine wellness examination for Criselda.  Hearing/Vision screen No results found.  Dietary issues and exercise activities discussed:     Goals Addressed   None    Depression Screen    07/23/2022   10:57 AM 10/01/2021   10:24 AM 07/15/2021    8:44 AM 01/14/2021    9:38 AM 07/12/2020   10:57 AM 05/30/2020    1:51 PM 05/23/2020   11:24 AM  PHQ 2/9 Scores  PHQ - 2 Score 0 0 0 0 0 0 0  PHQ- 9 Score 0  0 0  0     Fall Risk    07/23/2022   10:54 AM 01/17/2022    9:56 AM 10/01/2021   10:23 AM 07/15/2021    8:44 AM 01/14/2021    9:30 AM  Fall Risk   Falls in the past year? 0 0 0 0 0  Number falls in past yr: 0 0 0 0 0  Injury with Fall? 0 0 0 0 0  Risk for fall due to : No Fall Risks No Fall Risks No Fall Risks    Follow up   Education provided      MEDICARE RISK AT HOME:   TIMED UP AND GO:  Was the test performed?  No    Cognitive Function:    01/17/2022    9:58 AM 10/01/2021   10:29 AM  MMSE - Mini Mental State Exam  Not completed:  Unable to complete  Orientation to time 5   Orientation to Place 5   Registration 3   Attention/ Calculation 5   Recall 3   Language- name 2 objects 2   Language- repeat 1   Language- follow 3 step command 3   Language- read & follow direction 1   Write a sentence 1    Copy design 1   Total score 30         01/17/2022   10:01 AM 10/01/2021   10:29 AM  6CIT Screen  What Year? 0 points 0 points  What month? 0 points 0 points  What time? 0 points 0 points  Count back from 20 0 points 0 points  Months in reverse 0 points 0 points  Repeat phrase  0 points  Total Score  0 points    Immunizations Immunization History  Administered Date(s) Administered   PNEUMOCOCCAL CONJUGATE-20 07/12/2020    {TDAP status:2101805}  {Flu Vaccine status:2101806}  Pneumococcal vaccine status: Up to date  Covid-19 vaccine status: Information provided on how to obtain vaccines.   Qualifies for Shingles Vaccine? Yes   Zostavax completed No   Shingrix Completed?: No.    Education has been provided regarding the importance of this vaccine. Patient has been advised to call insurance company to determine out of pocket expense if they have not yet received this vaccine. Advised may also receive vaccine at local pharmacy or Health Dept. Verbalized acceptance and understanding.  Screening Tests Health Maintenance  Topic Date Due   DTaP/Tdap/Td (1 - Tdap) Never done   Zoster  Vaccines- Shingrix (1 of 2) Never done   COVID-19 Vaccine (1 - 2023-24 season) Never done   Lung Cancer Screening  08/09/2022   INFLUENZA VACCINE  09/04/2022   Medicare Annual Wellness (AWV)  01/18/2023   PAP SMEAR-Modifier  07/13/2023   MAMMOGRAM  12/21/2023   Colonoscopy  05/26/2027   Hepatitis C Screening  Completed   HIV Screening  Completed   HPV VACCINES  Aged Out    Health Maintenance  Health Maintenance Due  Topic Date Due   DTaP/Tdap/Td (1 - Tdap) Never done   Zoster Vaccines- Shingrix (1 of 2) Never done   COVID-19 Vaccine (1 - 2023-24 season) Never done   Lung Cancer Screening  08/09/2022   INFLUENZA VACCINE  09/04/2022    Colorectal cancer screening: Type of screening: Colonoscopy. Completed 05/25/20. Repeat every 7 years  Mammogram status: Completed 12/20/21. Repeat  every year  Lung Cancer Screening: (Low Dose CT Chest recommended if Age 3-80 years, 20 pack-year currently smoking OR have quit w/in 15years.) does qualify.   Lung Cancer Screening Referral: ***  Additional Screening:  Hepatitis C Screening: does qualify; Completed 08/26/16  Vision Screening: Recommended annual ophthalmology exams for early detection of glaucoma and other disorders of the eye. Is the patient up to date with their annual eye exam?  {YES/NO:21197} Who is the provider or what is the name of the office in which the patient attends annual eye exams? *** If pt is not established with a provider, would they like to be referred to a provider to establish care? {YES/NO:21197}.   Dental Screening: Recommended annual dental exams for proper oral hygiene  Community Resource Referral / Chronic Care Management: CRR required this visit?  {YES/NO:21197}  CCM required this visit?  {CCM Required choices:[id number]}     Plan:     I have personally reviewed and noted the following in the patient's chart:   Medical and social history Use of alcohol, tobacco or illicit drugs  Current medications and supplements including opioid prescriptions. {Opioid Prescriptions:[id number]} Functional ability and status Nutritional status Physical activity Advanced directives List of other physicians Hospitalizations, surgeries, and ER visits in previous 12 months Vitals Screenings to include cognitive, depression, and falls Referrals and appointments  In addition, I have reviewed and discussed with patient certain preventive protocols, quality metrics, and best practice recommendations. A written personalized care plan for preventive services as well as general preventive health recommendations were provided to patient.     Kandis Fantasia [place], [place]   06/08/2128   After Visit Summary: {CHL AMB AWV After Visit Summary:[id number]}  Nurse Notes: ***

## 2022-09-09 NOTE — Patient Instructions (Incomplete)
Kimberly Sosa , Thank you for taking time to come for your Medicare Wellness Visit. I appreciate your ongoing commitment to your health goals. Please review the following plan we discussed and let me know if I can assist you in the future.   Referrals/Orders/Follow-Ups/Clinician Recommendations: Aim for 30 minutes of exercise or brisk walking, 6-8 glasses of water, and 5 servings of fruits and vegetables each day.  This is a list of the screening recommended for you and due dates:  Health Maintenance  Topic Date Due   DTaP/Tdap/Td vaccine (1 - Tdap) Never done   Zoster (Shingles) Vaccine (1 of 2) Never done   COVID-19 Vaccine (1 - 2023-24 season) Never done   Screening for Lung Cancer  08/09/2022   Flu Shot  09/04/2022   Medicare Annual Wellness Visit  01/18/2023   Pap Smear  07/13/2023   Mammogram  12/21/2023   Colon Cancer Screening  05/26/2027   Hepatitis C Screening  Completed   HIV Screening  Completed   HPV Vaccine  Aged Out    Advanced directives: (ACP Link)Information on Advanced Care Planning can be found at [facility] [place] Advance Health Care Directives Advance Health Care Directives ([email or web address]   Next Medicare Annual Wellness Visit scheduled for next year: Yes  Preventive Care 40-64 Years, Female Preventive care refers to lifestyle choices and visits with your health care provider that can promote health and wellness. What does preventive care include? A yearly physical exam. This is also called an annual well check. Dental exams once or twice a year. Routine eye exams. Ask your health care provider how often you should have your eyes checked. Personal lifestyle choices, including: Daily care of your teeth and gums. Regular physical activity. Eating a healthy diet. Avoiding tobacco and drug use. Limiting alcohol use. Practicing safe sex. Taking low-dose aspirin daily starting at age 72. Taking vitamin and mineral supplements as recommended by your  health care provider. What happens during an annual well check? The services and screenings done by your health care provider during your annual well check will depend on your age, overall health, lifestyle risk factors, and family history of disease. Counseling  Your health care provider may ask you questions about your: Alcohol use. Tobacco use. Drug use. Emotional well-being. Home and relationship well-being. Sexual activity. Eating habits. Work and work Astronomer. Method of birth control. Menstrual cycle. Pregnancy history. Screening  You may have the following tests or measurements: Height, weight, and BMI. Blood pressure. Lipid and cholesterol levels. These may be checked every 5 years, or more frequently if you are over 78 years old. Skin check. Lung cancer screening. You may have this screening every year starting at age 89 if you have a 30-pack-year history of smoking and currently smoke or have quit within the past 15 years. Fecal occult blood test (FOBT) of the stool. You may have this test every year starting at age 27. Flexible sigmoidoscopy or colonoscopy. You may have a sigmoidoscopy every 5 years or a colonoscopy every 10 years starting at age 46. Hepatitis C blood test. Hepatitis B blood test. Sexually transmitted disease (STD) testing. Diabetes screening. This is done by checking your blood sugar (glucose) after you have not eaten for a while (fasting). You may have this done every 1-3 years. Mammogram. This may be done every 1-2 years. Talk to your health care provider about when you should start having regular mammograms. This may depend on whether you have a family history of breast  cancer. BRCA-related cancer screening. This may be done if you have a family history of breast, ovarian, tubal, or peritoneal cancers. Pelvic exam and Pap test. This may be done every 3 years starting at age 14. Starting at age 15, this may be done every 5 years if you have a Pap test  in combination with an HPV test. Bone density scan. This is done to screen for osteoporosis. You may have this scan if you are at high risk for osteoporosis. Discuss your test results, treatment options, and if necessary, the need for more tests with your health care provider. Vaccines  Your health care provider may recommend certain vaccines, such as: Influenza vaccine. This is recommended every year. Tetanus, diphtheria, and acellular pertussis (Tdap, Td) vaccine. You may need a Td booster every 10 years. Zoster vaccine. You may need this after age 50. Pneumococcal 13-valent conjugate (PCV13) vaccine. You may need this if you have certain conditions and were not previously vaccinated. Pneumococcal polysaccharide (PPSV23) vaccine. You may need one or two doses if you smoke cigarettes or if you have certain conditions. Talk to your health care provider about which screenings and vaccines you need and how often you need them. This information is not intended to replace advice given to you by your health care provider. Make sure you discuss any questions you have with your health care provider. Document Released: 02/16/2015 Document Revised: 10/10/2015 Document Reviewed: 11/21/2014 Elsevier Interactive Patient Education  2017 [employer].    Fall Prevention in the Home Falls can cause injuries. They can happen to people of all ages. There are many things you can do to make your home safe and to help prevent falls. What can I do on the outside of my home? Regularly fix the edges of walkways and driveways and fix any cracks. Remove anything that might make you trip as you walk through a door, such as a raised step or threshold. Trim any bushes or trees on the path to your home. Use bright outdoor lighting. Clear any walking paths of anything that might make someone trip, such as rocks or tools. Regularly check to see if handrails are loose or broken. Make sure that both sides of any steps have  handrails. Any raised decks and porches should have guardrails on the edges. Have any leaves, snow, or ice cleared regularly. Use sand or salt on walking paths during winter. Clean up any spills in your garage right away. This includes oil or grease spills. What can I do in the bathroom? Use night lights. Install grab bars by the toilet and in the tub and shower. Do not use towel bars as grab bars. Use non-skid mats or decals in the tub or shower. If you need to sit down in the shower, use a plastic, non-slip stool. Keep the floor dry. Clean up any water that spills on the floor as soon as it happens. Remove soap buildup in the tub or shower regularly. Attach bath mats securely with double-sided non-slip rug tape. Do not have throw rugs and other things on the floor that can make you trip. What can I do in the bedroom? Use night lights. Make sure that you have a light by your bed that is easy to reach. Do not use any sheets or blankets that are too big for your bed. They should not hang down onto the floor. Have a firm chair that has side arms. You can use this for support while you get dressed. Do not  have throw rugs and other things on the floor that can make you trip. What can I do in the kitchen? Clean up any spills right away. Avoid walking on wet floors. Keep items that you use a lot in easy-to-reach places. If you need to reach something above you, use a strong step stool that has a grab bar. Keep electrical cords out of the way. Do not use floor polish or wax that makes floors slippery. If you must use wax, use non-skid floor wax. Do not have throw rugs and other things on the floor that can make you trip. What can I do with my stairs? Do not leave any items on the stairs. Make sure that there are handrails on both sides of the stairs and use them. Fix handrails that are broken or loose. Make sure that handrails are as long as the stairways. Check any carpeting to make sure  that it is firmly attached to the stairs. Fix any carpet that is loose or worn. Avoid having throw rugs at the top or bottom of the stairs. If you do have throw rugs, attach them to the floor with carpet tape. Make sure that you have a light switch at the top of the stairs and the bottom of the stairs. If you do not have them, ask someone to add them for you. What else can I do to help prevent falls? Wear shoes that: Do not have high heels. Have rubber bottoms. Are comfortable and fit you well. Are closed at the toe. Do not wear sandals. If you use a stepladder: Make sure that it is fully opened. Do not climb a closed stepladder. Make sure that both sides of the stepladder are locked into place. Ask someone to hold it for you, if possible. Clearly mark and make sure that you can see: Any grab bars or handrails. First and last steps. Where the edge of each step is. Use tools that help you move around (mobility aids) if they are needed. These include: Canes. Walkers. Scooters. Crutches. Turn on the lights when you go into a dark area. Replace any light bulbs as soon as they burn out. Set up your furniture so you have a clear path. Avoid moving your furniture around. If any of your floors are uneven, fix them. If there are any pets around you, be aware of where they are. Review your medicines with your doctor. Some medicines can make you feel dizzy. This can increase your chance of falling. Ask your doctor what other things that you can do to help prevent falls. This information is not intended to replace advice given to you by your health care provider. Make sure you discuss any questions you have with your health care provider. Document Released: 11/16/2008 Document Revised: 06/28/2015 Document Reviewed: 02/24/2014 Elsevier Interactive Patient Education  2017 [employer].

## 2022-09-10 ENCOUNTER — Ambulatory Visit: Payer: 59 | Attending: Family Medicine

## 2022-09-10 VITALS — Ht 67.0 in | Wt 247.0 lb

## 2022-09-10 DIAGNOSIS — Z0000 Encounter for general adult medical examination without abnormal findings: Secondary | ICD-10-CM

## 2022-09-16 ENCOUNTER — Other Ambulatory Visit: Payer: Self-pay

## 2022-10-07 ENCOUNTER — Other Ambulatory Visit: Payer: Self-pay

## 2022-11-14 ENCOUNTER — Other Ambulatory Visit: Payer: Self-pay | Admitting: Family Medicine

## 2022-11-14 DIAGNOSIS — Z1231 Encounter for screening mammogram for malignant neoplasm of breast: Secondary | ICD-10-CM

## 2022-12-04 ENCOUNTER — Other Ambulatory Visit: Payer: Self-pay

## 2022-12-04 ENCOUNTER — Other Ambulatory Visit: Payer: Self-pay | Admitting: Family Medicine

## 2022-12-04 DIAGNOSIS — M19072 Primary osteoarthritis, left ankle and foot: Secondary | ICD-10-CM

## 2022-12-04 MED ORDER — IBUPROFEN 600 MG PO TABS
600.0000 mg | ORAL_TABLET | Freq: Three times a day (TID) | ORAL | 0 refills | Status: DC | PRN
Start: 2022-12-04 — End: 2023-01-23
  Filled 2022-12-04: qty 60, 20d supply, fill #0

## 2022-12-05 ENCOUNTER — Other Ambulatory Visit: Payer: Self-pay

## 2022-12-22 ENCOUNTER — Ambulatory Visit: Payer: 59

## 2023-01-08 ENCOUNTER — Other Ambulatory Visit: Payer: Self-pay

## 2023-01-12 ENCOUNTER — Other Ambulatory Visit: Payer: Self-pay | Admitting: Family Medicine

## 2023-01-12 ENCOUNTER — Other Ambulatory Visit: Payer: Self-pay

## 2023-01-12 DIAGNOSIS — I10 Essential (primary) hypertension: Secondary | ICD-10-CM

## 2023-01-12 MED ORDER — LISINOPRIL-HYDROCHLOROTHIAZIDE 10-12.5 MG PO TABS
1.0000 | ORAL_TABLET | Freq: Every day | ORAL | 0 refills | Status: DC
Start: 2023-01-12 — End: 2023-03-25
  Filled 2023-01-12: qty 90, 90d supply, fill #0

## 2023-01-21 ENCOUNTER — Ambulatory Visit
Admission: RE | Admit: 2023-01-21 | Discharge: 2023-01-21 | Disposition: A | Discharge status: Home / Self Care | Payer: 59 | Source: Ambulatory Visit | Attending: Family Medicine | Admitting: Family Medicine

## 2023-01-21 DIAGNOSIS — Z1231 Encounter for screening mammogram for malignant neoplasm of breast: Secondary | ICD-10-CM

## 2023-01-22 ENCOUNTER — Ambulatory Visit: Payer: 59 | Admitting: Family Medicine

## 2023-01-23 ENCOUNTER — Other Ambulatory Visit: Payer: Self-pay | Admitting: Family Medicine

## 2023-01-23 ENCOUNTER — Other Ambulatory Visit: Payer: Self-pay

## 2023-01-23 DIAGNOSIS — M19072 Primary osteoarthritis, left ankle and foot: Secondary | ICD-10-CM

## 2023-01-23 MED ORDER — IBUPROFEN 600 MG PO TABS
600.0000 mg | ORAL_TABLET | Freq: Three times a day (TID) | ORAL | 0 refills | Status: DC | PRN
Start: 2023-01-23 — End: 2023-03-23
  Filled 2023-01-23: qty 60, 20d supply, fill #0

## 2023-03-23 ENCOUNTER — Other Ambulatory Visit: Payer: Self-pay | Admitting: Family Medicine

## 2023-03-23 ENCOUNTER — Other Ambulatory Visit: Payer: Self-pay

## 2023-03-23 DIAGNOSIS — M19072 Primary osteoarthritis, left ankle and foot: Secondary | ICD-10-CM

## 2023-03-23 MED ORDER — IBUPROFEN 600 MG PO TABS
600.0000 mg | ORAL_TABLET | Freq: Three times a day (TID) | ORAL | 0 refills | Status: DC | PRN
  Filled 2023-03-23: qty 60, 20d supply, fill #0

## 2023-03-23 NOTE — Telephone Encounter (Signed)
Requested medication (s) are due for refill today: yes   Requested medication (s) are on the active medication list: yes   Last refill:  01/23/23 #60 0 refills   Future visit scheduled: yes in 2 days   Notes to clinic:  last labs 01/16/22. Protocol failed. No refills remain. Do you want to refill Rx?     Requested Prescriptions  Pending Prescriptions Disp Refills   ibuprofen (ADVIL) 600 MG tablet 60 tablet 0    Sig: Take 1 tablet (600 mg total) by mouth every 8 (eight) hours as needed.     Analgesics:  NSAIDS Failed - 03/23/2023  2:41 PM      Failed - Manual Review: Labs are only required if the patient has taken medication for more than 8 weeks.      Failed - HGB in normal range and within 360 days    Hemoglobin  Date Value Ref Range Status  01/16/2022 13.1 11.1 - 15.9 g/dL Final         Failed - PLT in normal range and within 360 days    Platelets  Date Value Ref Range Status  01/16/2022 452 (H) 150 - 450 x10E3/uL Final         Failed - HCT in normal range and within 360 days    Hematocrit  Date Value Ref Range Status  01/16/2022 40.2 34.0 - 46.6 % Final         Passed - Cr in normal range and within 360 days    Creat  Date Value Ref Range Status  03/20/2016 0.83 0.50 - 1.05 mg/dL Final    Comment:      For patients > or = 63 years of age: The upper reference limit for Creatinine is approximately 13% higher for people identified as African-American.      Creatinine, Ser  Date Value Ref Range Status  07/24/2022 0.96 0.57 - 1.00 mg/dL Final   Creatinine, Urine  Date Value Ref Range Status  03/20/2016 150 20 - 320 mg/dL Final         Passed - eGFR is 30 or above and within 360 days    GFR, Est African American  Date Value Ref Range Status  03/20/2016 >89 >=60 mL/min Final   GFR calc Af Amer  Date Value Ref Range Status  12/19/2019 80 >59 mL/min/1.73 Final    Comment:    **In accordance with recommendations from the NKF-ASN Task force,**   [facility] is  in the process of updating its eGFR calculation to the   2021 CKD-EPI creatinine equation that estimates kidney function   without a race variable.    GFR, Est Non African American  Date Value Ref Range Status  03/20/2016 80 >=60 mL/min Final   GFR calc non Af Amer  Date Value Ref Range Status  12/19/2019 69 >59 mL/min/1.73 Final   eGFR  Date Value Ref Range Status  07/24/2022 67 >59 mL/min/1.73 Final         Passed - Patient is not pregnant      Passed - Valid encounter within last 12 months    Recent Outpatient Visits           8 months ago Vitiligo   [facility] [place]. [facility] Hoy Register, MD   1 year ago Mass of left wrist   [facility] [place] [facility] Of [place]. [facility] [place], [place] B,  MD   1 year ago Leukocytosis, unspecified type   [facility] - [facility] Dept Of [place]. [facility] Hoy Register, MD   1 year ago Smoking greater than 20 pack years   [facility] [place] [facility] Of [place]. [facility] Hoy Register, MD   2 years ago Prediabetes   [facility] [place] - [facility] Of [place]. [facility] Hoy Register, MD       Future Appointments             In 2 days Hoy Register, MD [facility] [place] - A Dept Of [place]. [facility]

## 2023-03-23 NOTE — Telephone Encounter (Signed)
Copied from CRM [id number]. Topic: Clinical - Medication Refill >> Mar 23, 2023  9:01 AM Elle L wrote: Most Recent Primary Care Visit:  Provider: Anthoney Harada  Department: [facility]  Visit Type: MEDICARE AWV, SEQUENTIAL  Date: 09/10/2022  Medication: Ibuprofen (ADVIL) 600 MG tablet  Has the patient contacted their pharmacy? Yes  Is this the correct pharmacy for this prescription? Yes If no, delete pharmacy and type the correct one.  This is the patient's preferred pharmacy:  [facility] [address] [place] [address] Phone: [phone] Fax: [phone]   Has the prescription been filled recently? No  Is the patient out of the medication? Yes  Has the patient been seen for an appointment in the last year OR does the patient have an upcoming appointment? Yes  Can we respond through MyChart? Yes  Agent: Please be advised that Rx refills may take up to 3 business days. We ask that you follow-up with your pharmacy.

## 2023-03-25 ENCOUNTER — Ambulatory Visit: Payer: 59 | Attending: Family Medicine | Admitting: Family Medicine

## 2023-03-25 ENCOUNTER — Other Ambulatory Visit: Payer: Self-pay

## 2023-03-25 ENCOUNTER — Encounter: Payer: Self-pay | Admitting: Family Medicine

## 2023-03-25 DIAGNOSIS — F17210 Nicotine dependence, cigarettes, uncomplicated: Secondary | ICD-10-CM | POA: Diagnosis not present

## 2023-03-25 DIAGNOSIS — R7303 Prediabetes: Secondary | ICD-10-CM

## 2023-03-25 DIAGNOSIS — M1711 Unilateral primary osteoarthritis, right knee: Secondary | ICD-10-CM | POA: Diagnosis not present

## 2023-03-25 DIAGNOSIS — M47816 Spondylosis without myelopathy or radiculopathy, lumbar region: Secondary | ICD-10-CM

## 2023-03-25 DIAGNOSIS — E7800 Pure hypercholesterolemia, unspecified: Secondary | ICD-10-CM

## 2023-03-25 DIAGNOSIS — I10 Essential (primary) hypertension: Secondary | ICD-10-CM

## 2023-03-25 MED ORDER — LISINOPRIL-HYDROCHLOROTHIAZIDE 10-12.5 MG PO TABS
1.0000 | ORAL_TABLET | Freq: Every day | ORAL | 1 refills | Status: DC
  Filled 2023-03-25 – 2023-04-07 (×4): qty 90, 90d supply, fill #0
  Filled 2023-04-10 – 2023-07-01 (×2): qty 90, 90d supply, fill #1

## 2023-03-25 MED ORDER — PRAVASTATIN SODIUM 40 MG PO TABS
40.0000 mg | ORAL_TABLET | Freq: Every day | ORAL | 1 refills | Status: DC
  Filled 2023-03-25 – 2023-04-07 (×3): qty 90, 90d supply, fill #0

## 2023-03-25 NOTE — Progress Notes (Signed)
Virtual Visit via Video Note  I connected with Kimberly Sosa, on 03/25/2023 at 2:58 PM by video enabled telemedicine device and verified that I am speaking with the correct person using two identifiers.   Consent: I discussed the limitations, risks, security and privacy concerns of performing an evaluation and management service by telemedicine and the availability of in person appointments. I also discussed with the patient that there may be a patient responsible charge related to this service. The patient expressed understanding and agreed to proceed.   Location of Patient: Home  Location of Provider: Home Office   Persons participating in Telemedicine visit: Will Getsemani Lindon Farrington-CMA Dr. Alvis Lemmings     History of Present Illness: Kimberly Sosa is a 63 y.o. year old female  with a history of  hypertension, hypercholesterolemia, degenerative disease of the lumbar spine, nicotine dependence, knee osteoarthritis, Prediabetes   Discussed the use of AI scribe software for clinical note transcription with the patient, who gave verbal consent to proceed.   The patient, with a history of back and knee pain, reports that the weather has been exacerbating her symptoms. She has been managing the pain with ibuprofen, which she takes in the morning with her other medications. She tries to skip a day between doses, as she does not like taking pills frequently. She also has a prescription for Flexeril, a muscle relaxer, but she has only taken about five pills since she received the prescription. She tries not to take the ibuprofen and Flexeril together, as she knows it will make her drowsy. She reports that the ibuprofen seems to work better for her pain.  She is adherent with her and hypertensive and statin.  She is due for blood work to check her kidney function and cholesterol levels, and to screen for prediabetes. She plans to come in for these tests next  Monday.  The patient also shares some personal news: her son recently had a baby girl, making her a grandparent for the third time. She expresses excitement about this new addition to her family. However, she also mentions that she is still trying to quit smoking. She is currently using patches to help with this, but she admits that she still smokes one or two cigarettes a day.  Greater than 20-pack-year history of smoking.     Past Medical History:  Diagnosis Date   Active smoker    Allergy    Anemia    past hx    Back pain    Carpal tunnel syndrome    LEFT HAND   DJD (degenerative joint disease), lumbar    Hypercholesterolemia    Hypertension    Kidney stone    Pinched nerve in neck     Allergies  Allergen Reactions   Citrus Itching    Peaches  Oranges    Hydrocodone Other (See Comments)    Causes Headaches   Metronidazole Itching   Mobic [Meloxicam] Other (See Comments)    Causes Headaches   Tetracyclines & Related Swelling    All over body swelling    Current Outpatient Medications on File Prior to Visit  Medication Sig Dispense Refill   Cholecalciferol (VITAMIN D3 PO) Take by mouth.     cyclobenzaprine (FLEXERIL) 10 MG tablet Take 1 tablet (10 mg total) by mouth at bedtime as needed for muscle spasms. 90 tablet 1   diclofenac Sodium (VOLTAREN) 1 % GEL Apply 2 grams topically 4 (four) times daily. 100 g 1  ibuprofen (ADVIL) 600 MG tablet Take 1 tablet (600 mg total) by mouth every 8 (eight) hours as needed. 60 tablet 0   lisinopril-hydrochlorothiazide (ZESTORETIC) 10-12.5 MG tablet TAKE 1 TABLET BY MOUTH ONCE DAILY. 90 tablet 0   nicotine (NICODERM CQ) 7 mg/24hr patch Place 1 patch (7 mg total) onto the skin daily. 28 patch 3   pravastatin (PRAVACHOL) 40 MG tablet Take 1 tablet (40 mg total) by mouth daily. 90 tablet 1   triamcinolone cream (KENALOG) 0.1 % Apply 1 application topically 2 (two) times daily. 30 g 1   No current facility-administered medications on file  prior to visit.    ROS: See HPI  Observations/Objective: Awake, alert, oriented x3 Not in acute distress Normal mood      Latest Ref Rng & Units 07/24/2022    9:52 AM 01/16/2022    3:26 PM 07/15/2021    9:26 AM  CMP  Glucose 70 - 99 mg/dL 97  409  811   BUN 8 - 27 mg/dL 18  17  26    Creatinine 0.57 - 1.00 mg/dL 9.14  7.82  9.56   Sodium 134 - 144 mmol/L 144  143  146   Potassium 3.5 - 5.2 mmol/L 4.6  4.4  4.5   Chloride 96 - 106 mmol/L 109  104  109   CO2 20 - 29 mmol/L 21  23  21    Calcium 8.7 - 10.3 mg/dL 9.6  21.3  08.6   Total Protein 6.0 - 8.5 g/dL 6.5  7.1  6.7   Total Bilirubin 0.0 - 1.2 mg/dL <5.7  0.2  0.3   Alkaline Phos 44 - 121 IU/L 117  120  112   AST 0 - 40 IU/L 10  11  12    ALT 0 - 32 IU/L 9  8  12      Lipid Panel     Component Value Date/Time   CHOL 160 07/24/2022 0952   TRIG 68 07/24/2022 0952   HDL 61 07/24/2022 0952   CHOLHDL 2.7 07/12/2020 1158   LDLCALC 86 07/24/2022 0952   LABVLDL 13 07/24/2022 0952    Lab Results  Component Value Date   HGBA1C 6.3 (H) 07/24/2022     Assessment and Plan:     Chronic Back and Knee Pain Weather exacerbates symptoms. Ibuprofen provides relief, but patient is cautious about consistent use. Flexeril available but rarely used due to sedating effects. -Continue ibuprofen as needed for pain. -Consider use of Flexeril as needed   Hyperlipidemia Medication refill due. -Refill cholesterol medication for 90 days. -Lipid panel ordered  Prediabetes Due for blood work to monitor status. -A1C was 6.3 in 07/2022 -Schedule fasting blood work for prediabetes monitoring on 03/30/2023.  Tobacco Use Patient acknowledges need to quit\ -Encourage continued efforts to quit smoking.  Greater 20-pack-year history of smoking Due for annual CT scan for lung screening due to smoking history. -Order CT scan of lungs. [facility] will reach out to patient for scheduling.  General Health Maintenance / Followup  Plans -Schedule follow-up appointment when patient comes in for blood work on 03/30/2023. -Dermatology appointment scheduled for 04/28/2023 to evaluate potential tomato allergy.        Follow Up Instructions: 6 Months for chronic disease management   I discussed the assessment and treatment plan with the patient. The patient was provided an opportunity to ask questions and all were answered. The patient agreed with the plan and demonstrated an understanding of the instructions.   The  patient was advised to call back or seek an in-person evaluation if the symptoms worsen or if the condition fails to improve as anticipated.     I provided 15 minutes total of Telehealth time during this encounter including median intraservice time, reviewing previous notes, investigations, ordering medications, medical decision making, coordinating care and patient verbalized understanding at the end of the visit.     Hoy Register, MD, FAAFP. [facility] [place], [place] [phone]   03/25/2023, 2:58 PM

## 2023-03-30 ENCOUNTER — Ambulatory Visit: Payer: 59 | Attending: Family Medicine

## 2023-03-30 ENCOUNTER — Other Ambulatory Visit: Payer: Self-pay

## 2023-03-30 DIAGNOSIS — R7303 Prediabetes: Secondary | ICD-10-CM

## 2023-03-30 DIAGNOSIS — I10 Essential (primary) hypertension: Secondary | ICD-10-CM

## 2023-03-31 ENCOUNTER — Encounter: Payer: Self-pay | Admitting: Family Medicine

## 2023-03-31 LAB — LP+NON-HDL CHOLESTEROL
Cholesterol, Total: 173 mg/dL (ref 100–199)
HDL: 66 mg/dL (ref >39)
LDL Chol Calc (NIH): 93 mg/dL (ref 0–99)
Total Non-HDL-Chol (LDL+VLDL): 107 mg/dL (ref 0–129)
Triglycerides: 75 mg/dL (ref 0–149)
VLDL Cholesterol Cal: 14 mg/dL (ref 5–40)

## 2023-03-31 LAB — HEMOGLOBIN A1C
Est. average glucose Bld gHb Est-mCnc: 137 mg/dL
Hgb A1c MFr Bld: 6.4 % — ABNORMAL HIGH (ref 4.8–5.6)

## 2023-03-31 LAB — CMP14+EGFR
ALT: 13 [IU]/L (ref 0–32)
AST: 10 [IU]/L (ref 0–40)
Albumin: 4.1 g/dL (ref 3.9–4.9)
Alkaline Phosphatase: 111 [IU]/L (ref 44–121)
BUN/Creatinine Ratio: 19 (ref 12–28)
BUN: 21 mg/dL (ref 8–27)
Bilirubin Total: 0.2 mg/dL (ref 0.0–1.2)
CO2: 22 mmol/L (ref 20–29)
Calcium: 9.6 mg/dL (ref 8.7–10.3)
Chloride: 108 mmol/L — ABNORMAL HIGH (ref 96–106)
Creatinine, Ser: 1.12 mg/dL — ABNORMAL HIGH (ref 0.57–1.00)
Globulin, Total: 2.4 g/dL (ref 1.5–4.5)
Glucose: 99 mg/dL (ref 70–99)
Potassium: 4.6 mmol/L (ref 3.5–5.2)
Sodium: 145 mmol/L — ABNORMAL HIGH (ref 134–144)
Total Protein: 6.5 g/dL (ref 6.0–8.5)
eGFR: 56 mL/min/{1.73_m2} — ABNORMAL LOW (ref >59)

## 2023-04-07 ENCOUNTER — Other Ambulatory Visit: Payer: Self-pay

## 2023-04-09 ENCOUNTER — Other Ambulatory Visit: Payer: Self-pay

## 2023-04-10 ENCOUNTER — Other Ambulatory Visit: Payer: Self-pay

## 2023-04-23 ENCOUNTER — Ambulatory Visit
Admission: RE | Admit: 2023-04-23 | Discharge: 2023-04-23 | Disposition: A | Discharge status: Home / Self Care | Payer: 59 | Source: Ambulatory Visit | Attending: Family Medicine | Admitting: Family Medicine

## 2023-04-23 DIAGNOSIS — Z122 Encounter for screening for malignant neoplasm of respiratory organs: Secondary | ICD-10-CM | POA: Diagnosis not present

## 2023-04-23 DIAGNOSIS — F17210 Nicotine dependence, cigarettes, uncomplicated: Secondary | ICD-10-CM

## 2023-06-01 ENCOUNTER — Encounter: Payer: Self-pay | Admitting: Family Medicine

## 2023-06-01 ENCOUNTER — Other Ambulatory Visit: Payer: Self-pay | Admitting: Family Medicine

## 2023-06-01 ENCOUNTER — Telehealth: Payer: Self-pay | Admitting: Family Medicine

## 2023-06-01 DIAGNOSIS — I2510 Atherosclerotic heart disease of native coronary artery without angina pectoris: Secondary | ICD-10-CM

## 2023-06-01 NOTE — Telephone Encounter (Signed)
 Copied from CRM [id number]. Topic: Clinical - Medication Question >> Jun 01, 2023 10:36 AM Lotus Round B wrote: Reason for CRM: pt called in to see if she can get something to clear her congestion up . She said she has been doing a home remedy with honey and lemon and its working a tiny bit but would like to see if she can get a medication so get rid of it all . If possible to call the patient or she said to send a my chart message

## 2023-06-01 NOTE — Telephone Encounter (Signed)
 Will forward to provider for recommendations

## 2023-06-02 MED ORDER — GUAIFENESIN ER 600 MG PO TB12
600.0000 mg | ORAL_TABLET | Freq: Two times a day (BID) | ORAL | 0 refills | Status: AC
  Filled 2023-06-02 – 2023-06-04 (×2): qty 20, 10d supply, fill #0

## 2023-06-02 NOTE — Addendum Note (Signed)
 Addended by: Cambrey Lupi on: 06/02/2023 09:31 PM   Modules accepted: Orders

## 2023-06-02 NOTE — Telephone Encounter (Signed)
 I have sent a Prescription for Mucinex to her Pharmacy

## 2023-06-03 ENCOUNTER — Other Ambulatory Visit: Payer: Self-pay

## 2023-06-03 NOTE — Telephone Encounter (Signed)
 Reached out to pt and made aware that rx was sent to the pharmacy

## 2023-06-04 ENCOUNTER — Encounter (HOSPITAL_BASED_OUTPATIENT_CLINIC_OR_DEPARTMENT_OTHER): Payer: Self-pay

## 2023-06-04 ENCOUNTER — Emergency Department (HOSPITAL_BASED_OUTPATIENT_CLINIC_OR_DEPARTMENT_OTHER)
Admission: EM | Admit: 2023-06-04 | Discharge: 2023-06-04 | Disposition: A | Discharge status: Home / Self Care | Attending: Emergency Medicine | Admitting: Emergency Medicine

## 2023-06-04 ENCOUNTER — Other Ambulatory Visit: Payer: Self-pay

## 2023-06-04 DIAGNOSIS — Z79899 Other long term (current) drug therapy: Secondary | ICD-10-CM | POA: Insufficient documentation

## 2023-06-04 DIAGNOSIS — I10 Essential (primary) hypertension: Secondary | ICD-10-CM | POA: Insufficient documentation

## 2023-06-04 DIAGNOSIS — T7840XA Allergy, unspecified, initial encounter: Secondary | ICD-10-CM | POA: Diagnosis not present

## 2023-06-04 MED ORDER — PREDNISONE 50 MG PO TABS: 60.0000 mg | ORAL_TABLET | Freq: Once | ORAL | Status: DC

## 2023-06-04 MED ORDER — DIPHENHYDRAMINE HCL 25 MG PO CAPS
25.0000 mg | ORAL_CAPSULE | Freq: Four times a day (QID) | ORAL | Status: DC | PRN
  Administered 2023-06-04: 25 mg via ORAL
  Filled 2023-06-04: qty 1

## 2023-06-04 MED ORDER — EPINEPHRINE 0.3 MG/0.3ML IJ SOAJ
0.3000 mg | INTRAMUSCULAR | 0 refills | Status: AC | PRN
  Filled 2023-06-04: qty 2, 30d supply, fill #0

## 2023-06-04 NOTE — ED Triage Notes (Signed)
 Reports bee sting to R nostril. Swelling noted to R side of nose and cheek. Denies SHOB, difficulty breathing, hives.

## 2023-06-04 NOTE — Discharge Instructions (Addendum)
 You were given a small dose of Benadryl  while in the emergency department.  You may repeat this dose later on tonight.  You are also given a prescription for an EpiPen , please read instructions carefully prior to using.  If you experience any shortness of breath, diarrhea, difficulty breathing you will need to use this EpiPen  and call 911

## 2023-06-04 NOTE — ED Provider Notes (Signed)
 [place] [facility] Provider Note   CSN: [id number] Arrival date & time: 06/04/23  1732     History HTN Chief Complaint  Patient presents with   Allergic Reaction    Kimberly Sosa is a 63 y.o. female.  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  She reports she was exiting her apartment when suddenly a honeybee stung her on the right nostril, there is swelling to the area, she reports she felt that her face was likely worsening with swelling.  She did not take any medication for improvement in symptoms.  She does not have any shortness of breath, no chest pain, no diarrhea, no abdominal pain.  No difficulty breathing.  The history is provided by the patient.  Allergic Reaction      Home Medications Prior to Admission medications   Medication Sig Start Date End Date Taking? Authorizing Provider  EPINEPHrine  0.3 mg/0.3 mL IJ SOAJ injection Inject 0.3 mg into the muscle as needed for anaphylaxis. 06/04/23  Yes Oleg Oleson, PA-C  Cholecalciferol (VITAMIN D3 PO) Take by mouth.    [provider]  cyclobenzaprine  (FLEXERIL ) 10 MG tablet Take 1 tablet (10 mg total) by mouth at bedtime as needed for muscle spasms. 07/23/22   Newlin, Enobong, MD  diclofenac  Sodium (VOLTAREN ) 1 % GEL Apply 2 grams topically 4 (four) times daily. 07/15/21   Newlin, Enobong, MD  guaiFENesin  (MUCINEX ) 600 MG 12 hr tablet Take 1 tablet (600 mg total) by mouth 2 (two) times daily. 06/02/23   Newlin, Enobong, MD  ibuprofen  (ADVIL ) 600 MG tablet Take 1 tablet (600 mg total) by mouth every 8 (eight) hours as needed. 03/23/23   Newlin, Enobong, MD  lisinopril -hydrochlorothiazide  (ZESTORETIC ) 10-12.5 MG tablet TAKE 1 TABLET BY MOUTH ONCE DAILY. 03/25/23   Newlin, Enobong, MD  nicotine  (NICODERM CQ ) 7 mg/24hr patch Place 1 patch (7 mg total) onto the skin daily.  07/23/22   Newlin, Enobong, MD  pravastatin  (PRAVACHOL ) 40 MG tablet Take 1 tablet (40 mg total) by mouth daily. 03/25/23   Newlin, Enobong, MD  triamcinolone  cream (KENALOG ) 0.1 % Apply 1 application topically 2 (two) times daily. 05/30/20   Newlin, Enobong, MD      Allergies    Beeswax, Citrus, Hydrocodone, Metronidazole , Mobic [meloxicam], and Tetracyclines & related    Review of Systems   Review of Systems  Constitutional:  Negative for chills and fever.  Respiratory:  Negative for shortness of breath.   Cardiovascular:  Negative for chest pain.  Skin:  Positive for wound.  All other systems reviewed and are negative.   Physical Exam Updated Vital Signs BP (!) 185/95 (BP Location: Right Wrist)   Pulse (!) 114   Temp 98.8 F (37.1 C) (Oral)   Resp 18   Ht 5\' 6"  (1.676 m)   Wt 112.9 kg   SpO2 95%   BMI 40.19 kg/m  Physical Exam Vitals and nursing note reviewed.  Constitutional:      Appearance: Normal appearance.  HENT:     Head: Normocephalic and atraumatic.     Nose: Nose normal.     Right Turbinates: Not enlarged or swollen.     Left Turbinates: Not enlarged or swollen.      Mouth/Throat:     Mouth: Mucous membranes are moist.     Comments: Oropharynx is visualized without any swelling. Eyes:  Pupils: Pupils are equal, round, and reactive to light.  Cardiovascular:     Rate and Rhythm: Normal rate.  Pulmonary:     Effort: Pulmonary effort is normal.     Breath sounds: No wheezing or rales.  Abdominal:     General: Abdomen is flat.     Palpations: Abdomen is soft.  Musculoskeletal:     Cervical back: Normal range of motion and neck supple.  Neurological:     Mental Status: She is alert.     ED Results / Procedures / Treatments   Labs (all labs ordered are listed, but only abnormal results are displayed) Labs Reviewed - No data to display  EKG None  Radiology No results found.  Procedures Procedures    Medications Ordered in ED Medications   diphenhydrAMINE  (BENADRYL ) capsule 25 mg (25 mg Oral Given 06/04/23 1852)    ED Course/ Medical Decision Making/ A&P                                 Medical Decision Making Risk Prescription drug management.    Patient presents the ED with a chief complaint of bee sting which occurred an hour prior to arrival to the emergency department.  She reports she was stung by a bee, did not take any medication for improvement symptoms.  Has swelling to the right nostril but this is localized, there is no further swelling noted.  No hives, no rashes noted to the face or arms.  She is not complaining of any shortness of breath, no chest pain.  Her lungs are clear to auscultation without any wheezing, there is no difficulty breathing.  Given oral Benadryl  at this time to help with symptoms.  I do not feel the patient needs further medication at this time as it seems to be a minor staying.  Discussed continuing symptomatic treatment at home with further Benadryl , will need to follow-up with PCP as needed.  She was also dispensed an epinephrine  pen, we discussed when she would need to use this.  She is agreeable to plan treatment, return precautions discussed at length.  Patient stable for discharge.  Portions of this note were generated with Scientist, clinical (histocompatibility and immunogenetics). Dictation errors may occur despite best attempts at proofreading.   Final Clinical Impression(s) / ED Diagnoses Final diagnoses:  Allergic reaction, initial encounter    Rx / DC Orders ED Discharge Orders          Ordered    EPINEPHrine  0.3 mg/0.3 mL IJ SOAJ injection  As needed        06/04/23 1849              Daysy Santini, PA-C 06/04/23 1854    Wynetta Heckle, MD 06/05/23 2327

## 2023-06-05 ENCOUNTER — Other Ambulatory Visit: Payer: Self-pay

## 2023-06-05 DIAGNOSIS — L219 Seborrheic dermatitis, unspecified: Secondary | ICD-10-CM | POA: Diagnosis not present

## 2023-06-05 DIAGNOSIS — L299 Pruritus, unspecified: Secondary | ICD-10-CM | POA: Diagnosis not present

## 2023-06-05 MED ORDER — KETOCONAZOLE 2 % EX CREA
TOPICAL_CREAM | Freq: Every day | CUTANEOUS | 11 refills | Status: AC
  Filled 2023-06-05: qty 60, 30d supply, fill #0
  Filled 2023-07-01: qty 60, 30d supply, fill #1

## 2023-06-05 MED ORDER — TACROLIMUS 0.1 % EX OINT
TOPICAL_OINTMENT | CUTANEOUS | 6 refills | Status: AC
Start: 2023-06-05 — End: ?
  Filled 2023-06-05: qty 100, 30d supply, fill #0

## 2023-06-08 ENCOUNTER — Other Ambulatory Visit: Payer: Self-pay

## 2023-06-09 ENCOUNTER — Other Ambulatory Visit: Payer: Self-pay

## 2023-06-11 ENCOUNTER — Other Ambulatory Visit: Payer: Self-pay

## 2023-06-12 ENCOUNTER — Other Ambulatory Visit: Payer: Self-pay

## 2023-06-15 ENCOUNTER — Other Ambulatory Visit: Payer: Self-pay

## 2023-06-16 ENCOUNTER — Other Ambulatory Visit: Payer: Self-pay

## 2023-06-17 ENCOUNTER — Other Ambulatory Visit: Payer: Self-pay

## 2023-06-18 ENCOUNTER — Ambulatory Visit

## 2023-06-18 ENCOUNTER — Other Ambulatory Visit: Payer: Self-pay

## 2023-06-18 VITALS — BP 160/80 | HR 77 | Ht 66.0 in | Wt 250.0 lb

## 2023-06-18 DIAGNOSIS — I2510 Atherosclerotic heart disease of native coronary artery without angina pectoris: Secondary | ICD-10-CM | POA: Diagnosis not present

## 2023-06-18 DIAGNOSIS — F17211 Nicotine dependence, cigarettes, in remission: Secondary | ICD-10-CM

## 2023-06-18 DIAGNOSIS — E7800 Pure hypercholesterolemia, unspecified: Secondary | ICD-10-CM | POA: Diagnosis not present

## 2023-06-18 DIAGNOSIS — Z8249 Family history of ischemic heart disease and other diseases of the circulatory system: Secondary | ICD-10-CM | POA: Diagnosis not present

## 2023-06-18 DIAGNOSIS — I10 Essential (primary) hypertension: Secondary | ICD-10-CM

## 2023-06-18 MED ORDER — ASPIRIN 81 MG PO TBEC: 81.0000 mg | DELAYED_RELEASE_TABLET | Freq: Every day | ORAL | 3 refills | Status: AC

## 2023-06-18 MED ORDER — PRAVASTATIN SODIUM 80 MG PO TABS
80.0000 mg | ORAL_TABLET | Freq: Every evening | ORAL | 3 refills | Status: AC
  Filled 2023-06-18: qty 90, 90d supply, fill #0
  Filled 2023-09-18: qty 90, 90d supply, fill #1
  Filled 2023-12-18: qty 90, 90d supply, fill #2

## 2023-06-18 NOTE — Assessment & Plan Note (Signed)
 Recent lipid panel 03-30-2023 with total cholesterol 173, HDL 66, LDL 93, triglycerides 75.  In the setting of coronary atherosclerosis as noted on CT chest imaging, would recommend targeting LDL levels close to 55 mg/dL or below.  She has been motivated and modified her diet and switching to Mediterranean diet.  Advised her to start exercising regularly. Continue pravastatin , titrate up the dose to 80 mg once daily.  If follow-up blood work lipid panel at next visit in 6 months not at goal, will switch to Crestor and consider adding Zetia.

## 2023-06-18 NOTE — Progress Notes (Signed)
 Cardiology Consultation:    Date:  06/18/2023   ID:  Kimberly Sosa, DOB 1960/09/14, MRN [id number]  PCP:  Joaquin Mulberry, MD  Cardiologist:  Angelena Kells, MD   Referring MD: Joaquin Mulberry, MD   No chief complaint on file.    ASSESSMENT AND PLAN:   [name] 63 year old woman with history of hypertension, hyperlipidemia, prediabetes, renal stones, CKD stage III, anemia, smokes tobacco now recently quit 2 weeks ago, sinus arrhythmia on prior EKGs, now with coronary and aortic atherosclerosis noted on lung cancer screening study from March 2025.   Problem List Items Addressed This Visit     Hypertension   Uncontrolled blood pressure readings in the office today. Mentions typically well-controlled. Advised to keep a log of blood pressure readings at home and if consistently above 130/80 mmHg will need further escalation of antihypertensive therapy.  Continue lisinopril -hydrochlorothiazide  10 mg - 12.5 mg once daily. If blood pressures remain uncontrolled this dose can be doubled up.      Relevant Medications   pravastatin  (PRAVACHOL ) 80 MG tablet   aspirin  EC 81 MG tablet   Other Relevant Orders   EKG 12-Lead (Completed)   Hypercholesterolemia   Recent lipid panel 03-30-2023 with total cholesterol 173, HDL 66, LDL 93, triglycerides 75.  In the setting of coronary atherosclerosis as noted on CT chest imaging, would recommend targeting LDL levels close to 55 mg/dL or below.  She has been motivated and modified her diet and switching to Mediterranean diet.  Advised her to start exercising regularly. Continue pravastatin , titrate up the dose to 80 mg once daily.  If follow-up blood work lipid panel at next visit in 6 months not at goal, will switch to Crestor and consider adding Zetia.        Relevant Medications   pravastatin  (PRAVACHOL ) 80 MG tablet   aspirin  EC 81 MG tablet   Nicotine  dependence   Has recently quit smoking 2 weeks ago.   Congratulated her regarding this.  Advised to stay motivated and pick up alternate healthy habits to avoid going back to smoking.        Coronary atherosclerosis   Significant three-vessel coronary atherosclerosis reported on lung cancer screening CT chest imaging from March 2025.  She does have cardiovascular risk factors in the form of age, hypertension, smoking.  Remains asymptomatic at this time. Good functional status at baseline.  Discussed the importance of secondary prevention. Start aspirin  81 mg once daily. She is currently on statin therapy with pravastatin  40 mg once daily. Will escalate the dose to 80 mg once daily.  Advised her to notify us  if any significant symptoms of chest pain or shortness of breath or change in functional capacity noted.       Relevant Medications   pravastatin  (PRAVACHOL ) 80 MG tablet   aspirin  EC 81 MG tablet   Other Visit Diagnoses       Family history of cardiomyopathy    -  Primary   Relevant Orders   EKG 12-Lead (Completed)         History of Present Illness:    Kimberly Sosa is a 63 y.o. female who is being seen today for the evaluation of coronary and aortic atherosclerosis noted on recent lung cancer CT chest imaging from March 2025 at the request of Newlin, Enobong, MD.  Had a visit with Dr. Amanda Jungling in cardiology 01/21/2022 for evaluation of sinus arrhythmia and palpitations at night.  Echocardiogram done at that time showed no  significant abnormalities in January 2024  Has a history of hypertension, hyperlipidemia, prediabetes, renal stones, CKD stage III, anemia, smokes tobacco now recently quit 2 weeks ago, sinus arrhythmia on prior EKGs, now with coronary and aortic atherosclerosis noted on lung cancer screening study from March 2025.  Pleasant woman here for the visit by herself.  Lives with her daughter son-in-law and grandkids.  Retired.  Keeps busy with her grandkids.  Does not regularly exercise but stays  active.  Has had hip replacements bilateral and hence cannot jog but does walk regularly.  Denies any symptoms of chest pain, shortness of breath, orthopnea, paroxysmal nocturnal dyspnea.  No palpitations.  No lightheadedness, syncopal or near syncopal episodes.  No pedal edema.  No blood in urine or stools.  Mentions blood pressures routinely at other healthcare visits and when checked out of office have been well-controlled.  Today's elevated blood pressure reading she feels is due to significant anxiety.  Quit smoking 2 weeks ago cold [place] after sinus infection.  Has been doing well. Compliant with her medications.  EKG in the clinic sinus rhythm 77/min, PR interval 150 ms, QRS duration 80 ms, no significant ST-T changes to suggest ischemia.  Echocardiogram from January 2024 noted normal biventricular function LVEF 60 to 65%, aortic valve mildly sclerotic without stenosis.  Trace mitral regurgitation.  Blood work from 03-30-2023 reviewed Hemoglobin A1c 6.4, prediabetes CMP with BUN 21, creatinine 1.12, EGFR 56, suggest CKD stage III Lipid panel with total cholesterol 173, HDL 66, LDL 93, triglycerides 75. Last thyroid  panel available is from 01/16/2022 TSH normal 1.85, T3 total 129, normal, free T40.99 Normal. m   Past Medical History:  Diagnosis Date   Active smoker    Allergy    Anemia    past hx    Back pain    Carpal tunnel syndrome    LEFT HAND   DJD (degenerative joint disease), lumbar    Hypercholesterolemia    Hypertension    Kidney stone    Pinched nerve in neck     Past Surgical History:  Procedure Laterality Date   APPENDECTOMY     BREAST BIOPSY Right 07/02/2018   COLONOSCOPY     POLYPECTOMY     ROTATOR CUFF REPAIR     TOTAL HIP ARTHROPLASTY Left 04/08/2016   Procedure: LEFT TOTAL HIP ARTHROPLASTY ANTERIOR APPROACH;  Surgeon: Claiborne Crew, MD;  Location: [facility];  Service: Orthopedics;  Laterality: Left;  Requests 70 mins   TOTAL HIP ARTHROPLASTY Right  07/08/2016   Procedure: RIGHT TOTAL HIP ARTHROPLASTY ANTERIOR APPROACH;  Surgeon: Claiborne Crew, MD;  Location: [facility];  Service: Orthopedics;  Laterality: Right;    Current Medications: Current Meds  Medication Sig   aspirin  EC 81 MG tablet Take 1 tablet (81 mg total) by mouth daily. Swallow whole.   Cholecalciferol (VITAMIN D3 PO) Take by mouth.   cyclobenzaprine  (FLEXERIL ) 10 MG tablet Take 1 tablet (10 mg total) by mouth at bedtime as needed for muscle spasms.   diclofenac  Sodium (VOLTAREN ) 1 % GEL Apply 2 grams topically 4 (four) times daily.   EPINEPHrine  0.3 mg/0.3 mL IJ SOAJ injection Inject 0.3 mg into the muscle as needed for anaphylaxis.   guaiFENesin  (MUCINEX ) 600 MG 12 hr tablet Take 1 tablet (600 mg total) by mouth 2 (two) times daily.   ibuprofen  (ADVIL ) 600 MG tablet Take 1 tablet (600 mg total) by mouth every 8 (eight) hours as needed.   ketoconazole  (NIZORAL ) 2 % cream Apply topically daily.  lisinopril -hydrochlorothiazide  (ZESTORETIC ) 10-12.5 MG tablet TAKE 1 TABLET BY MOUTH ONCE DAILY.   nicotine  (NICODERM CQ ) 7 mg/24hr patch Place 1 patch (7 mg total) onto the skin daily.   pravastatin  (PRAVACHOL ) 80 MG tablet Take 1 tablet (80 mg total) by mouth every evening.   PREDNISONE  PO Take 40 mg by mouth daily.   tacrolimus  (PROTOPIC ) 0.1 % ointment Apply 1 Application topically 2 (two) times a day.   triamcinolone  cream (KENALOG ) 0.1 % Apply 1 application topically 2 (two) times daily.   [DISCONTINUED] pravastatin  (PRAVACHOL ) 40 MG tablet Take 1 tablet (40 mg total) by mouth daily.     Allergies:   Beeswax, Citrus, Hydrocodone, Metronidazole , Mobic [meloxicam], and Tetracyclines & related   Social History   Socioeconomic History   Marital status: Single    Spouse name: Not on file   Number of children: 2   Years of education: Not on file   Highest education level: Not on file  Occupational History   Occupation: disabled  Tobacco Use   Smoking status: Former     Current packs/day: 0.00    Types: Cigarettes    Quit date: 07/05/2016    Years since quitting: 6.9   Smokeless tobacco: Never   Tobacco comments:    1 pack every 3 days  Substance and Sexual Activity   Alcohol use: Yes    Comment: occ    Drug use: Yes    Frequency: 3.0 times per week    Types: Marijuana    Comment: occasionally   Sexual activity: Yes    Birth control/protection: Surgical  Other Topics Concern   Not on file  Social History Narrative   Not on file   Social Drivers of Health   Financial Resource Strain: Low Risk  (10/01/2021)   Overall Financial Resource Strain (CARDIA)    Difficulty of Paying Living Expenses: Not very hard  Food Insecurity: No Food Insecurity (09/10/2022)   Hunger Vital Sign    Worried About Running Out of Food in the Last Year: Never true    Ran Out of Food in the Last Year: Never true  Transportation Needs: No Transportation Needs (09/10/2022)   PRAPARE - Administrator, Civil Service (Medical): No    Lack of Transportation (Non-Medical): No  Physical Activity: Sufficiently Active (09/10/2022)   Exercise Vital Sign    Days of Exercise per Week: 5 days    Minutes of Exercise per Session: 30 min  Stress: No Stress Concern Present (09/10/2022)   [employer] of Occupational Health - Occupational Stress Questionnaire    Feeling of Stress : Not at all  Social Connections: Socially Isolated (09/10/2022)   Social Connection and Isolation Panel [NHANES]    Frequency of Communication with Friends and Family: More than three times a week    Frequency of Social Gatherings with Friends and Family: Three times a week    Attends Religious Services: Never    Active Member of Clubs or Organizations: No    Attends Banker Meetings: Never    Marital Status: Never married     Family History: The patient's family history includes Breast cancer in her maternal grandmother; Congestive Heart Failure in her mother and sister; Diabetes in  her brother, brother, father, mother, and sister; Heart disease in her brother and sister; Heart failure in her brother, brother, and mother; Hypertension in her mother; Prostate cancer in her father. There is no history of Colon cancer, Rectal cancer, Esophageal cancer, Liver cancer,  or Colon polyps. ROS:   Please see the history of present illness.    All 14 point review of systems negative except as described per history of present illness.  EKGs/Labs/Other Studies Reviewed:    The following studies were reviewed today:   EKG:  EKG Interpretation Date/Time:  Thursday Jun 18 2023 15:35:02 EDT Ventricular Rate:  77 PR Interval:  150 QRS Duration:  80 QT Interval:  370 QTC Calculation: 418 R Axis:   12  Text Interpretation: Normal sinus rhythm Normal ECG When compared with ECG of 16-Jan-2022 16:01, No significant change was found Confirmed by Bertha Broad reddy [phone]) on 06/18/2023 3:42:54 PM    Recent Labs: 03/30/2023: ALT 13; BUN 21; Creatinine, Ser 1.12; Potassium 4.6; Sodium 145  Recent Lipid Panel    Component Value Date/Time   CHOL 173 03/30/2023 0951   TRIG 75 03/30/2023 0951   HDL 66 03/30/2023 0951   CHOLHDL 2.7 07/12/2020 1158   LDLCALC 93 03/30/2023 0951    Physical Exam:    VS:  BP (!) 160/80   Pulse 77   Ht 5\' 6"  (1.676 m)   Wt 250 lb (113.4 kg)   SpO2 99%   BMI 40.35 kg/m     Wt Readings from Last 3 Encounters:  06/18/23 250 lb (113.4 kg)  06/04/23 249 lb (112.9 kg)  09/10/22 247 lb (112 kg)     GENERAL:  Well nourished, well developed in no acute distress NECK: No JVD; No carotid bruits CARDIAC: RRR, S1 and S2 present, no murmurs, no rubs, no gallops CHEST:  Clear to auscultation without rales, wheezing or rhonchi  Extremities: No pitting pedal edema. Pulses bilaterally symmetric with radial 2+ and dorsalis pedis 2+ NEUROLOGIC:  Alert and oriented x 3  Medication Adjustments/Labs and Tests Ordered: Current medicines are reviewed at length  with the patient today.  Concerns regarding medicines are outlined above.  Orders Placed This Encounter  Procedures   EKG 12-Lead   Meds ordered this encounter  Medications   pravastatin  (PRAVACHOL ) 80 MG tablet    Sig: Take 1 tablet (80 mg total) by mouth every evening.    Dispense:  90 tablet    Refill:  3   aspirin  EC 81 MG tablet    Sig: Take 1 tablet (81 mg total) by mouth daily. Swallow whole.    Dispense:  90 tablet    Refill:  3    Signed, Ira Dougher reddy Alvia Jablonski, MD, MPH, [facility]. 06/18/2023 4:34 PM    [facility]

## 2023-06-18 NOTE — Assessment & Plan Note (Signed)
 Significant three-vessel coronary atherosclerosis reported on lung cancer screening CT chest imaging from March 2025.  She does have cardiovascular risk factors in the form of age, hypertension, smoking.  Remains asymptomatic at this time. Good functional status at baseline.  Discussed the importance of secondary prevention. Start aspirin  81 mg once daily. She is currently on statin therapy with pravastatin  40 mg once daily. Will escalate the dose to 80 mg once daily.  Advised her to notify us  if any significant symptoms of chest pain or shortness of breath or change in functional capacity noted.

## 2023-06-18 NOTE — Patient Instructions (Signed)
 Medication Instructions:  Your physician has recommended you make the following change in your medication:   START: Aspirin  81 mg daily START: Pravastatin  80 daily  *If you need a refill on your cardiac medications before your next appointment, please call your pharmacy*  Lab Work: None If you have labs (blood work) drawn today and your tests are completely normal, you will receive your results only by: MyChart Message (if you have MyChart) OR A paper copy in the mail If you have any lab test that is abnormal or we need to change your treatment, we will call you to review the results.  Testing/Procedures: None  Follow-Up: At [facility], you and your health needs are our priority.  As part of our continuing mission to provide you with exceptional heart care, our providers are all part of one team.  This team includes your primary Cardiologist (physician) and Advanced Practice Providers or APPs (Physician Assistants and Nurse Practitioners) who all work together to provide you with the care you need, when you need it.  Your next appointment:   6 month(s)  Provider:   Bertha Broad, MD    We recommend signing up for the patient portal called "MyChart".  Sign up information is provided on this After Visit Summary.  MyChart is used to connect with patients for Virtual Visits (Telemedicine).  Patients are able to view lab/test results, encounter notes, upcoming appointments, etc.  Non-urgent messages can be sent to your provider as well.   To learn more about what you can do with MyChart, go to [email or web address].   Other Instructions None

## 2023-06-18 NOTE — Assessment & Plan Note (Addendum)
 Has recently quit smoking 2 weeks ago.  Congratulated her regarding this.  Advised to stay motivated and pick up alternate healthy habits to avoid going back to smoking.

## 2023-06-18 NOTE — Assessment & Plan Note (Signed)
 Uncontrolled blood pressure readings in the office today. Mentions typically well-controlled. Advised to keep a log of blood pressure readings at home and if consistently above 130/80 mmHg will need further escalation of antihypertensive therapy.  Continue lisinopril -hydrochlorothiazide  10 mg - 12.5 mg once daily. If blood pressures remain uncontrolled this dose can be doubled up.

## 2023-06-22 ENCOUNTER — Other Ambulatory Visit: Payer: Self-pay

## 2023-06-23 ENCOUNTER — Other Ambulatory Visit: Payer: Self-pay

## 2023-06-23 MED ORDER — HYDROCORTISONE 2.5 % EX CREA
TOPICAL_CREAM | Freq: Two times a day (BID) | CUTANEOUS | 11 refills | Status: AC | PRN
  Filled 2023-06-23: qty 30, 15d supply, fill #0

## 2023-06-24 ENCOUNTER — Other Ambulatory Visit: Payer: Self-pay

## 2023-07-01 ENCOUNTER — Other Ambulatory Visit: Payer: Self-pay

## 2023-07-02 ENCOUNTER — Other Ambulatory Visit: Payer: Self-pay

## 2023-07-06 ENCOUNTER — Other Ambulatory Visit: Payer: Self-pay

## 2023-07-08 ENCOUNTER — Other Ambulatory Visit: Payer: Self-pay

## 2023-07-09 ENCOUNTER — Other Ambulatory Visit: Payer: Self-pay

## 2023-07-16 ENCOUNTER — Other Ambulatory Visit: Payer: Self-pay

## 2023-07-17 ENCOUNTER — Ambulatory Visit

## 2023-07-21 ENCOUNTER — Other Ambulatory Visit: Payer: Self-pay

## 2023-07-21 ENCOUNTER — Encounter (HOSPITAL_BASED_OUTPATIENT_CLINIC_OR_DEPARTMENT_OTHER): Payer: Self-pay | Admitting: Emergency Medicine

## 2023-07-21 ENCOUNTER — Emergency Department (HOSPITAL_BASED_OUTPATIENT_CLINIC_OR_DEPARTMENT_OTHER)

## 2023-07-21 ENCOUNTER — Ambulatory Visit: Payer: Self-pay

## 2023-07-21 ENCOUNTER — Emergency Department (HOSPITAL_BASED_OUTPATIENT_CLINIC_OR_DEPARTMENT_OTHER): Admission: EM | Admit: 2023-07-21 | Discharge: 2023-07-21 | Disposition: A | Discharge status: Home / Self Care

## 2023-07-21 DIAGNOSIS — T148XXA Other injury of unspecified body region, initial encounter: Secondary | ICD-10-CM

## 2023-07-21 DIAGNOSIS — S60222A Contusion of left hand, initial encounter: Secondary | ICD-10-CM | POA: Diagnosis not present

## 2023-07-21 DIAGNOSIS — S6992XA Unspecified injury of left wrist, hand and finger(s), initial encounter: Secondary | ICD-10-CM | POA: Diagnosis not present

## 2023-07-21 DIAGNOSIS — X58XXXA Exposure to other specified factors, initial encounter: Secondary | ICD-10-CM | POA: Insufficient documentation

## 2023-07-21 DIAGNOSIS — M79642 Pain in left hand: Secondary | ICD-10-CM | POA: Diagnosis not present

## 2023-07-21 DIAGNOSIS — Z7982 Long term (current) use of aspirin: Secondary | ICD-10-CM | POA: Diagnosis not present

## 2023-07-21 HISTORY — DX: Unspecified osteoarthritis, unspecified site: M19.90

## 2023-07-21 NOTE — Telephone Encounter (Signed)
 FYI Only or Action Required?: FYI only for provider  Patient was last seen in primary care on 03/25/2023 by Kimberly Mulberry, MD. Called Nurse Triage reporting Hand Pain. Symptoms began yesterday. Interventions attempted: OTC medications: Tylenol . Symptoms are: unchanged.  Triage Disposition: See PCP When Office is Open (Within 3 Days)  ** Appointment offered, there is no availability until July. Pt. Will go UC.     Patient/caregiver understands and will follow disposition?: Yes             Copied from CRM [id number]. Topic: Clinical - Red Word Triage >> Jul 21, 2023  5:31 PM Kevelyn M wrote: Red Word that prompted transfer to Nurse Triage: Left hand, knuckle in between middle and index finger. Patient noticed a blood bruise. Swollen and painful. Reason for Disposition  [1] MODERATE pain (e.g., interferes with normal activities) AND [2] present > 3 days  Answer Assessment - Initial Assessment Questions 1. ONSET: When did the pain start?   X 1 day  2. LOCATION: Where is the pain located?     Left hand, index finger, knuckle.   3. PAIN: How bad is the pain? (Scale 1-10; or mild, moderate, severe)   - MILD (1-3): doesn't interfere with normal activities   - MODERATE (4-7): interferes with normal activities (e.g., work or school) or awakens from sleep   - SEVERE (8-10): excruciating pain, unable to use hand at all     6/10   4. WORK OR EXERCISE: Has there been any recent work or exercise that involved this part (i.e., hand or wrist) of the body?    No 5. CAUSE: What do you think is causing the pain?     Unknown  6. AGGRAVATING FACTORS: What makes the pain worse? (e.g., using computer)     She reports if her hand is flat, the pain lessens, when using the hand it worsens  7. OTHER SYMPTOMS: Do you have any other symptoms? (e.g., neck pain, swelling, rash, numbness, fever)     No    She does not remember hitting her hand. She noticed a spot on index knuckle, she  reports a blood bruise. She is taking Tylenol  for the pain.  Protocols used: Hand and Wrist Pain-A-AH

## 2023-07-21 NOTE — Discharge Instructions (Signed)
 Your x-ray did not show any broken bones.  You have a bruise.  May take over-the-counter Tylenol  alternating with ibuprofen  for pain.  Please follow-up with your primary doctor.  Return immediately felt fevers, chills, redness to the hand, severe pain, numbness tingling changes in sensation or any new or worsening symptoms that are concerning to you.

## 2023-07-21 NOTE — ED Provider Notes (Signed)
 [place] [facility] AT [facility] Provider Note   CSN: [id number] Arrival date & time: 07/21/23  4098     Patient presents with: Hand Problem   Kimberly Sosa is a 63 y.o. female.   63 year old female presents emergency department with left hand pain with a bruise to the dorsum of her hand.  Noticed symptoms this morning.  Denies any trauma that she can recall.  No numbness tingling changes in sensation.        Prior to Admission medications   Medication Sig Start Date End Date Taking? Authorizing Provider  aspirin  EC 81 MG tablet Take 1 tablet (81 mg total) by mouth daily. Swallow whole. 06/18/23   Madireddy, Daymon Evans, MD  Cholecalciferol (VITAMIN D3 PO) Take by mouth.    [provider]  cyclobenzaprine  (FLEXERIL ) 10 MG tablet Take 1 tablet (10 mg total) by mouth at bedtime as needed for muscle spasms. 07/23/22   Newlin, Enobong, MD  diclofenac  Sodium (VOLTAREN ) 1 % GEL Apply 2 grams topically 4 (four) times daily. 07/15/21   Newlin, Enobong, MD  EPINEPHrine  0.3 mg/0.3 mL IJ SOAJ injection Inject 0.3 mg into the muscle as needed for anaphylaxis. 06/04/23   Soto, Johana, PA-C  guaiFENesin  (MUCINEX ) 600 MG 12 hr tablet Take 1 tablet (600 mg total) by mouth 2 (two) times daily. 06/02/23   Newlin, Enobong, MD  hydrocortisone  2.5 % cream Apply topically to affected areas up to 2 (two) times daily as needed. 06/23/23     ibuprofen  (ADVIL ) 600 MG tablet Take 1 tablet (600 mg total) by mouth every 8 (eight) hours as needed. 03/23/23   Newlin, Enobong, MD  ketoconazole  (NIZORAL ) 2 % cream Apply topically daily. 06/05/23     lisinopril -hydrochlorothiazide  (ZESTORETIC ) 10-12.5 MG tablet TAKE 1 TABLET BY MOUTH ONCE DAILY. 03/25/23   Newlin, Enobong, MD  nicotine  (NICODERM CQ ) 7 mg/24hr patch Place 1 patch (7 mg total) onto the skin daily. 07/23/22   Newlin, Enobong, MD  pravastatin  (PRAVACHOL ) 80 MG tablet Take 1 tablet (80 mg total) by mouth every evening.  06/18/23   Madireddy, Daymon Evans, MD  PREDNISONE  PO Take 40 mg by mouth daily.    [provider]  tacrolimus  (PROTOPIC ) 0.1 % ointment Apply 1 Application topically 2 (two) times a day. 06/05/23     triamcinolone  cream (KENALOG ) 0.1 % Apply 1 application topically 2 (two) times daily. 05/30/20   Newlin, Enobong, MD    Allergies: Beeswax, Citrus, Hydrocodone, Metronidazole , Mobic [meloxicam], and Tetracyclines & related    Review of Systems  Updated Vital Signs BP (!) 142/75 (BP Location: Right Arm)   Pulse 86   Temp 98.4 F (36.9 C) (Oral)   Resp 20   Ht 5' 7 (1.702 m)   Wt 112.9 kg   SpO2 96%   BMI 39.00 kg/m   Physical Exam Vitals and nursing note reviewed.  Constitutional:      Appearance: She is obese.  HENT:     Head: Normocephalic.     Nose: Nose normal.   Cardiovascular:     Pulses: Normal pulses.  Pulmonary:     Effort: Pulmonary effort is normal.  Abdominal:     General: Abdomen is flat.   Musculoskeletal:     Comments: Some bruising to the dorsum of her left hand between the knuckles of middle and ring finger.  Able to make a fist okay sign cross fingers over spread fingers out and wave.  Brisk capillary refill.  2+  radial pulse.  Normal sensation.  No anatomic snuffbox tenderness   Skin:    Capillary Refill: Capillary refill takes less than 2 seconds.   Neurological:     Mental Status: She is alert.   Psychiatric:     Comments: Anxious     (all labs ordered are listed, but only abnormal results are displayed) Labs Reviewed - No data to display  EKG: None  Radiology: DG Hand 2 View Left Result Date: 07/21/2023 EXAM: 2 VIEW(S) XRAY OF THE LEFT HAND 07/21/2023 07:29:00 PM COMPARISON: None available. CLINICAL HISTORY: Pain to base of middle and ring finger. Pt states pain at the base of 2nd and 3rd digit. No known injury. Pain started yesterday. Swollen raised area between 2nd and 3rd digit at the base. FINDINGS: BONES AND JOINTS: No acute  fracture. No focal osseous lesion. No joint dislocation. SOFT TISSUES: The soft tissues are unremarkable. IMPRESSION: 1. No acute osseous abnormality. Electronically signed by: Zadie Herter MD 07/21/2023 07:34 PM EDT RP Workstation: [id number]     Procedures   Medications Ordered in the ED - No data to display  Clinical Course as of 07/21/23 1940  Tue Jul 21, 2023  1937 DG Hand 2 View Left IMPRESSION: 1. No acute osseous abnormality.   [TY]    Clinical Course User Index [TY] Rolinda Climes, DO                                 Medical Decision Making 64 year old female with left hand pain.  Afebrile vital signs reassuring.  X-ray without fracture.  Exam neurovascularly intact.  Appears to have a bruise, to the hand but does not recall specific trauma or event.  Discussed supportive care with expectant management.  Stable for discharge to follow-up with primary doctor.  Amount and/or Complexity of Data Reviewed Radiology: ordered. Decision-making details documented in ED Course.      Final diagnoses:  None    ED Discharge Orders     None          Rolinda Climes, DO 07/21/23 1940

## 2023-07-21 NOTE — ED Triage Notes (Signed)
 Pt states she noticed a bruise to left hand with swelling that she noticed this am.  Some discomfort yesterday.  No known injuries.  No obvious deformities.

## 2023-07-22 ENCOUNTER — Other Ambulatory Visit: Payer: Self-pay

## 2023-07-22 NOTE — Telephone Encounter (Signed)
 Patient was seen in the ED for evaluation 07/21/2023

## 2023-07-24 ENCOUNTER — Other Ambulatory Visit: Payer: Self-pay

## 2023-07-27 ENCOUNTER — Other Ambulatory Visit: Payer: Self-pay

## 2023-09-16 ENCOUNTER — Ambulatory Visit: Payer: 59 | Attending: Family Medicine

## 2023-09-16 VITALS — Ht 67.0 in | Wt 249.0 lb

## 2023-09-16 DIAGNOSIS — Z0000 Encounter for general adult medical examination without abnormal findings: Secondary | ICD-10-CM

## 2023-09-16 NOTE — Patient Instructions (Signed)
 Kimberly Sosa , Thank you for taking time out of your busy schedule to complete your Annual Wellness Visit with me. I enjoyed our conversation and look forward to speaking with you again next year. I, as well as your care team,  appreciate your ongoing commitment to your health goals. Please review the following plan we discussed and let me know if I can assist you in the future. Your Game plan/ To Do List    Referrals: If you haven't heard from the office you've been referred to, please reach out to them at the phone provided.   Follow up Visits: We will see or speak with you next year for your Next Medicare AWV with our clinical staff Have you seen your provider in the last 6 months (3 months if uncontrolled diabetes)? Yes  Clinician Recommendations:  Aim for 30 minutes of exercise or brisk walking, 6-8 glasses of water, and 5 servings of fruits and vegetables each day.       This is a list of the screenings recommended for you:  Health Maintenance  Topic Date Due   Zoster (Shingles) Vaccine (1 of 2) Never done   COVID-19 Vaccine (1 - 2024-25 season) Never done   Flu Shot  09/04/2023   DTaP/Tdap/Td vaccine (1 - Tdap) 03/24/2024*   Screening for Lung Cancer  04/22/2024   Medicare Annual Wellness Visit  09/15/2024   Mammogram  01/20/2025   Pap with HPV screening  07/12/2025   Colon Cancer Screening  05/26/2027   Pneumococcal Vaccine for high risk medical condition  Completed   Pneumococcal Vaccine for age over 36  Completed   Hepatitis C Screening  Completed   HIV Screening  Completed   Hepatitis B Vaccine  Aged Out   HPV Vaccine  Aged Out   Meningitis B Vaccine  Aged Out  *Topic was postponed. The date shown is not the original due date.    Advanced directives: (Declined) Advance directive discussed with you today. Even though you declined this today, please call our office should you change your mind, and we can give you the proper paperwork for you to fill out. Advance Care  Planning is important because it:  [x]  Makes sure you receive the medical care that is consistent with your values, goals, and preferences  [x]  It provides guidance to your family and loved ones and reduces their decisional burden about whether or not they are making the right decisions based on your wishes.  Follow the link provided in your after visit summary or read over the paperwork we have mailed to you to help you started getting your Advance Directives in place. If you need assistance in completing these, please reach out to us  so that we can help you!  See attachments for Preventive Care and Fall Prevention Tips.

## 2023-09-16 NOTE — Progress Notes (Addendum)
 Because this visit was a virtual/telehealth visit,  certain criteria was not obtained, such a blood pressure, CBG if applicable, and timed get up and go. Any medications not marked as taking were not mentioned during the medication reconciliation part of the visit. Any vitals not documented were not able to be obtained due to this being a telehealth visit or patient was unable to self-report a recent blood pressure reading due to a lack of equipment at home via telehealth. Vitals that have been documented are verbally provided by the patient.   Subjective:   Kimberly Sosa is a 63 y.o. who presents for a Medicare Wellness preventive visit.  As a reminder, Annual Wellness Visits don't include a physical exam, and some assessments may be limited, especially if this visit is performed virtually. We may recommend an in-person follow-up visit with your provider if needed.  Visit Complete: Virtual I connected with  Kimberly Sosa on 09/16/23 by a audio enabled telemedicine application and verified that I am speaking with the correct person using two identifiers.  Patient Location: Home  Provider Location: Home Office  I discussed the limitations of evaluation and management by telemedicine. The patient expressed understanding and agreed to proceed.  Vital Signs: Because this visit was a virtual/telehealth visit, some criteria may be missing or patient reported. Any vitals not documented were not able to be obtained and vitals that have been documented are patient reported.  VideoDeclined- This patient declined Librarian, academic. Therefore the visit was completed with audio only.  Persons Participating in Visit: Patient.  AWV Questionnaire: No: Patient Medicare AWV questionnaire was not completed prior to this visit.  Cardiac Risk Factors include: advanced age (>97men, >87 women);hypertension;dyslipidemia;obesity (BMI >30kg/m2)     Objective:     Today's Vitals   09/16/23 0913  Weight: 249 lb (112.9 kg)  Height: 5' 7 (1.702 m)  PainSc: 5   PainLoc: Knee   Body mass index is 39 kg/m.     09/16/2023    9:19 AM 07/21/2023    6:49 PM 06/04/2023    5:44 PM 09/10/2022    8:59 AM 01/17/2022    9:55 AM 10/01/2021   10:23 AM 01/24/2021   10:24 AM  Advanced Directives  Does Patient Have a Medical Advance Directive? No No No No No No No  Would patient like information on creating a medical advance directive? No - Patient declined No - Patient declined  Yes (MAU/Ambulatory/Procedural Areas - Information given) Yes (ED - Information included in AVS) Yes (ED - Information included in AVS) No - Patient declined    Current Medications (verified) Outpatient Encounter Medications as of 09/16/2023  Medication Sig   aspirin  EC 81 MG tablet Take 1 tablet (81 mg total) by mouth daily. Swallow whole.   Cholecalciferol (VITAMIN D3 PO) Take by mouth.   cyclobenzaprine  (FLEXERIL ) 10 MG tablet Take 1 tablet (10 mg total) by mouth at bedtime as needed for muscle spasms.   diclofenac  Sodium (VOLTAREN ) 1 % GEL Apply 2 grams topically 4 (four) times daily.   EPINEPHrine  0.3 mg/0.3 mL IJ SOAJ injection Inject 0.3 mg into the muscle as needed for anaphylaxis.   guaiFENesin  (MUCINEX ) 600 MG 12 hr tablet Take 1 tablet (600 mg total) by mouth 2 (two) times daily.   hydrocortisone  2.5 % cream Apply topically to affected areas up to 2 (two) times daily as needed.   ibuprofen  (ADVIL ) 600 MG tablet Take 1 tablet (600 mg total) by mouth  every 8 (eight) hours as needed.   ketoconazole  (NIZORAL ) 2 % cream Apply topically daily.   lisinopril -hydrochlorothiazide  (ZESTORETIC ) 10-12.5 MG tablet TAKE 1 TABLET BY MOUTH ONCE DAILY.   nicotine  (NICODERM CQ ) 7 mg/24hr patch Place 1 patch (7 mg total) onto the skin daily.   pravastatin  (PRAVACHOL ) 80 MG tablet Take 1 tablet (80 mg total) by mouth every evening.   PREDNISONE  PO Take 40 mg by mouth daily.   tacrolimus   (PROTOPIC ) 0.1 % ointment Apply 1 Application topically 2 (two) times a day.   triamcinolone  cream (KENALOG ) 0.1 % Apply 1 application topically 2 (two) times daily.   No facility-administered encounter medications on file as of 09/16/2023.    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   Procedure: LEFT TOTAL HIP ARTHROPLASTY ANTERIOR APPROACH;  Surgeon: Donnice Car, MD;  Location: [facility];  Service: Orthopedics;  Laterality: Left;  Requests 70 mins   TOTAL HIP ARTHROPLASTY Right 07/08/2016   Procedure: RIGHT TOTAL HIP ARTHROPLASTY ANTERIOR APPROACH;  Surgeon: Car Donnice, MD;  Location: [facility];  Service: Orthopedics;  Laterality: Right;   Family History  Problem Relation Age of Onset   Diabetes Mother    Hypertension Mother    Heart failure Mother    Congestive Heart Failure Mother    Prostate cancer Father    Diabetes Father    Diabetes Sister    Congestive Heart Failure Sister    Heart disease Sister    Diabetes Brother        3 brothers deceased - CHF   Heart failure Brother        pacemaker   Heart failure Brother    Diabetes Brother    Heart disease Brother    Breast cancer Maternal Grandmother    Colon cancer Neg Hx    Rectal cancer Neg Hx    Esophageal cancer Neg Hx    Liver cancer Neg Hx    Colon polyps Neg Hx    Social History   Socioeconomic History   Marital status: Single    Spouse name: Not  on file   Number of children: 2   Years of education: Not on file   Highest education level: Not on file  Occupational History   Occupation: disabled  Tobacco Use   Smoking status: Former    Current packs/day: 0.00    Types: Cigarettes    Quit date: 06/01/2023    Years since quitting: 0.2   Smokeless tobacco: Never   Tobacco comments:    1 pack every 3 days  Substance and Sexual Activity   Alcohol use: Yes    Alcohol/week: 2.0 standard drinks of alcohol    Types: 2 Shots of liquor per week    Comment: occ    Drug use: Yes  Types: Marijuana   Sexual activity: Yes    Birth control/protection: Surgical  Other Topics Concern   Not on file  Social History Narrative   Not on file   Social Drivers of Health   Financial Resource Strain: Low Risk  (09/16/2023)   Overall Financial Resource Strain (CARDIA)    Difficulty of Paying Living Expenses: Not very hard  Food Insecurity: No Food Insecurity (09/16/2023)   Hunger Vital Sign    Worried About Running Out of Food in the Last Year: Never true    Ran Out of Food in the Last Year: Never true  Transportation Needs: No Transportation Needs (09/16/2023)   PRAPARE - Administrator, Civil Service (Medical): No    Lack of Transportation (Non-Medical): No  Physical Activity: Sufficiently Active (09/16/2023)   Exercise Vital Sign    Days of Exercise per Week: 7 days    Minutes of Exercise per Session: 30 min  Stress: No Stress Concern Present (09/16/2023)   [employer] of Occupational Health - Occupational Stress Questionnaire    Feeling of Stress: Not at all  Social Connections: Socially Isolated (09/16/2023)   Social Connection and Isolation Panel    Frequency of Communication with Friends and Family: More than three times a week    Frequency of Social Gatherings with Friends and Family: Three times a week    Attends Religious Services: Never    Active Member of Clubs or Organizations: No    Attends Tax inspector Meetings: Never    Marital Status: Never married    Tobacco Counseling Counseling given: Not Answered Tobacco comments: 1 pack every 3 days    Clinical Intake:  Pre-visit preparation completed: Yes  Pain : 0-10 Pain Score: 5  (TYLENOL )     Nutritional Risks: None Diabetes: No  Lab Results  Component Value Date   HGBA1C 6.4 (H) 03/30/2023   HGBA1C 6.3 (H) 07/24/2022   HGBA1C 6.1 (H) 01/16/2022     How often do you need to have someone help you when you read instructions, pamphlets, or other written materials from your doctor or pharmacy?: 1 - Never  Interpreter Needed?: No  Information entered by :: Roz Fuller, LPN.   Activities of Daily Living     09/16/2023    9:19 AM  In your present state of health, do you have any difficulty performing the following activities:  Hearing? 0  Vision? 0  Difficulty concentrating or making decisions? 0  Comment BSE: GAMES ON PHONE, 2 YR OLD GRANDDAUGHTER, READING, ETC.  Walking or climbing stairs? 0  Dressing or bathing? 0  Doing errands, shopping? 0  Preparing Food and eating ? N  Using the Toilet? N  In the past six months, have you accidently leaked urine? N  Do you have problems with loss of bowel control? N  Managing your Medications? N  Managing your Finances? N  Housekeeping or managing your Housekeeping? N    Patient Care Team: Delbert Clam, MD as PCP - General (Family Medicine) Alvan Ronal BRAVO, MD (Inactive) as PCP - Cardiology (Cardiology) Nicholaus Almarie LABOR, MD as Referring Physician (Optometry)  I have updated your Care Teams any recent Medical Services you may have received from other providers in the past year.     Assessment:   This is a routine wellness examination for Khiley.  Hearing/Vision screen Hearing Screening - Comments:: Denies hearing difficulties.  Vision Screening - Comments:: Wears rx glasses - up to date with routine eye  exams with [facility]     Goals Addressed             This Visit's Progress    09/16/23: My goal is to continue with natural healing, stay independent and active.         Depression Screen     09/16/2023    9:22 AM 03/25/2023    2:46 PM 09/10/2022    8:58 AM 07/23/2022   10:57 AM 10/01/2021   10:24 AM 07/15/2021    8:44 AM 01/14/2021    9:38 AM  PHQ 2/9 Scores  PHQ - 2 Score 0 0 0 0 0 0 0  PHQ- 9 Score 0  0 0  0 0    Fall Risk     09/16/2023    9:18 AM 03/25/2023    2:46 PM 09/10/2022    8:59 AM 07/23/2022   10:54 AM 01/17/2022    9:56 AM  Fall Risk   Falls in the past year? 0 0 0 0 0  Number falls in past yr: 0 0 0 0 0  Injury with Fall? 0 0 0 0 0  Risk for fall due to : No Fall Risks No Fall Risks No Fall Risks No Fall Risks No Fall Risks  Follow up Falls evaluation completed Falls evaluation completed Falls prevention discussed;Education provided;Falls evaluation completed      MEDICARE RISK AT HOME:  Medicare Risk at Home Any stairs in or around the home?: No If so, are there any without handrails?: No Home free of loose throw rugs in walkways, pet beds, electrical cords, etc?: Yes Adequate lighting in your home to reduce risk of falls?: Yes Life alert?: No Use of a cane, walker or w/c?: No Grab bars in the bathroom?: Yes Shower chair or bench in shower?: No Elevated toilet seat or a handicapped toilet?: No  TIMED UP AND GO:  Was the test performed?  No  Cognitive Function: 6CIT completed    09/16/2023    9:22 AM 01/17/2022    9:58 AM 10/01/2021   10:29 AM  MMSE - Mini Mental State Exam  Not completed: Unable to complete  Unable to complete  Orientation to time  5   Orientation to Place  5   Registration  3   Attention/ Calculation  5   Recall  3   Language- name 2 objects  2   Language- repeat  1   Language- follow 3 step command  3   Language- read & follow direction  1   Write a sentence  1   Copy design  1   Total score  30         09/16/2023    9:27 AM 09/10/2022     8:59 AM 01/17/2022   10:01 AM 10/01/2021   10:29 AM  6CIT Screen  What Year? 0 points 0 points 0 points 0 points  What month? 0 points 0 points 0 points 0 points  What time? 0 points 0 points 0 points 0 points  Count back from 20 0 points 0 points 0 points 0 points  Months in reverse 0 points 0 points 0 points 0 points  Repeat phrase 0 points 0 points  0 points  Total Score 0 points 0 points  0 points    Immunizations Immunization History  Administered Date(s) Administered   PNEUMOCOCCAL CONJUGATE-20 07/12/2020    Screening Tests Health Maintenance  Topic Date Due   Zoster Vaccines- Shingrix (1 of 2) Never  done   COVID-19 Vaccine (1 - 2024-25 season) Never done   INFLUENZA VACCINE  09/04/2023   DTaP/Tdap/Td (1 - Tdap) 03/24/2024 (Originally 05/13/1979)   Lung Cancer Screening  04/22/2024   Medicare Annual Wellness (AWV)  09/15/2024   MAMMOGRAM  01/20/2025   Cervical Cancer Screening (HPV/Pap Cotest)  07/12/2025   Colonoscopy  05/26/2027   Pneumococcal Vaccine: 19-49 Years  Completed   Pneumococcal Vaccine: 50+ Years  Completed   Hepatitis C Screening  Completed   HIV Screening  Completed   Hepatitis B Vaccines  Aged Out   HPV VACCINES  Aged Out   Meningococcal B Vaccine  Aged Out    Health Maintenance  Health Maintenance Due  Topic Date Due   Zoster Vaccines- Shingrix (1 of 2) Never done   COVID-19 Vaccine (1 - 2024-25 season) Never done   INFLUENZA VACCINE  09/04/2023   Health Maintenance Items Addressed: Yes Patient aware of current care gaps.  Immunization record was verified by [facility] and updated in patient's chart.  Additional Screening:  Vision Screening: Recommended annual ophthalmology exams for early detection of glaucoma and other disorders of the eye. Would you like a referral to an eye doctor? No    Dental Screening: Recommended annual dental exams for proper oral hygiene  Community Resource Referral / Chronic Care Management: CRR required this visit?   No   CCM required this visit?  No   Plan:    I have personally reviewed and noted the following in the patient's chart:   Medical and social history Use of alcohol, tobacco or illicit drugs  Current medications and supplements including opioid prescriptions. Patient is not currently taking opioid prescriptions. Functional ability and status Nutritional status Physical activity Advanced directives List of other physicians Hospitalizations, surgeries, and ER visits in previous 12 months Vitals Screenings to include cognitive, depression, and falls Referrals and appointments  In addition, I have reviewed and discussed with patient certain preventive protocols, quality metrics, and best practice recommendations. A written personalized care plan for preventive services as well as general preventive health recommendations were provided to patient.   Roz LOISE Fuller, LPN   [id number]/[id number]   After Visit Summary: (MyChart) Due to this being a telephonic visit, the after visit summary with patients personalized plan was offered to patient via MyChart   Notes: Patient declined all vaccines due to potential side effects.

## 2023-09-18 ENCOUNTER — Other Ambulatory Visit: Payer: Self-pay

## 2023-09-23 ENCOUNTER — Telehealth: Payer: Self-pay | Admitting: Family Medicine

## 2023-09-23 NOTE — Telephone Encounter (Signed)
 pt confirmed appt

## 2023-09-24 ENCOUNTER — Ambulatory Visit: Payer: 59 | Attending: Family Medicine | Admitting: Family Medicine

## 2023-09-24 ENCOUNTER — Other Ambulatory Visit: Payer: Self-pay

## 2023-09-24 ENCOUNTER — Encounter: Payer: Self-pay | Admitting: Family Medicine

## 2023-09-24 VITALS — BP 151/76 | HR 72 | Ht 67.0 in | Wt 254.0 lb

## 2023-09-24 DIAGNOSIS — M19071 Primary osteoarthritis, right ankle and foot: Secondary | ICD-10-CM

## 2023-09-24 DIAGNOSIS — R7303 Prediabetes: Secondary | ICD-10-CM

## 2023-09-24 DIAGNOSIS — I10 Essential (primary) hypertension: Secondary | ICD-10-CM

## 2023-09-24 DIAGNOSIS — I2510 Atherosclerotic heart disease of native coronary artery without angina pectoris: Secondary | ICD-10-CM

## 2023-09-24 DIAGNOSIS — I2583 Coronary atherosclerosis due to lipid rich plaque: Secondary | ICD-10-CM | POA: Diagnosis not present

## 2023-09-24 LAB — POCT GLYCOSYLATED HEMOGLOBIN (HGB A1C): HbA1c, POC (prediabetic range): 5.9 % (ref 5.7–6.4)

## 2023-09-24 MED ORDER — LISINOPRIL-HYDROCHLOROTHIAZIDE 20-25 MG PO TABS
1.0000 | ORAL_TABLET | Freq: Every day | ORAL | 1 refills | Status: DC
  Filled 2023-09-24: qty 90, 90d supply, fill #0

## 2023-09-24 NOTE — Patient Instructions (Signed)
 VISIT SUMMARY:  Today, we discussed your progress and health management after quitting smoking. We reviewed your blood pressure, heart health, arthritis pain, cholesterol levels, prediabetes, and cannabis use. We also talked about your current medications and lifestyle changes.  YOUR PLAN:  -HYPERTENSION: Hypertension means high blood pressure. Your blood pressure is still elevated, so we are increasing your lisinopril -hydrochlorothiazide  dose to 20/25 mg daily. Please continue taking your current medications, including pravastatin  and aspirin , and consider getting a home blood pressure monitor to track your readings.  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 Continue taking pravastatin  and low-dose aspirin  as prescribed. Keep up with your lifestyle changes, including smoking cessation and dietary adjustments.  -OSTEOARTHRITIS WITH CHRONIC BACK, KNEE, AND FOOT PAIN: Osteoarthritis is a type of arthritis that occurs when the protective cartilage that cushions the ends of your bones wears down over time. Continue using Voltaren  gel and your knee brace for pain relief. Regular exercise, as tolerated, is also recommended.  -HYPERLIPIDEMIA: Hyperlipidemia means having high levels of fats (lipids) in your blood. Your cholesterol levels were normal in February, and you should continue taking pravastatin  to manage your cholesterol and reduce cardiovascular risk.  -PREDIABETES: Prediabetes means your blood sugar levels are higher than normal but not high enough to be classified as diabetes. Continue making dietary changes to reduce carbohydrate intake and increase your consumption of vegetables. Be moderate with starchy foods.  -CANNABIS USE: You use cannabis rolled in cigarillo paper, which may contribute to nicotine  intake. We are exploring alternatives to help manage your symptoms without  increasing nicotine  exposure.  -HISTORY OF TOBACCO USE: You have successfully quit smoking since June 01, 2023, which significantly reduces your health risks. Continue to avoid smoking and seek support if needed.  INSTRUCTIONS:  Please follow up with your cardiologist as scheduled and consider getting a home blood pressure monitor to track your readings. Continue with your current medications and lifestyle changes. If you have any concerns or experience any new symptoms, please contact our office.

## 2023-09-24 NOTE — Progress Notes (Signed)
 Subjective:  Patient ID: Kimberly Sosa, female    DOB: [birth year]  Age: 63 y.o. MRN: [id number]  CC: Medical Management of Chronic Issues (Pain in toe on right foot)     Discussed the use of AI scribe software for clinical note transcription with the patient, who gave verbal consent to proceed.  History of Present Illness Kimberly Sosa is a 63 year old female with a history of  hypertension, hypercholesterolemia, degenerative disease of the lumbar spine, nicotine  dependence (quit in 05/2023) , knee osteoarthritis, Prediabetes  and coronary artery disease who presents for follow-up after quitting smoking.  She quit smoking cigarettes on April 28th and has not experienced cravings since. She continues to smoke marijuana using cigarillo paper, which she believes helps with spasms and nicotine  cravings. She was informed of age advanced coronary artery calcification, 3V CAD after a CT scan and is on pravastatin  80 mg and low-dose aspirin  81 mg. She is attempting lifestyle changes, including exercises from YouTube, but is limited by weather and personal circumstances.  Her blood pressure has been elevated since May 2025. She has been taking her antihypertensive medication regularly, but not today awaiting lab work. She lacks a home blood pressure monitor. She switched from ibuprofen  to Tylenol  for pain management and is currently on lisinopril /hydrochlorothiazide  10/12.5 mg.  She manages foot arthritis with Voltaren  gel and uses a knee brace for support. She experiences stiffness and pain, especially in cold weather, and stays active with home exercises and caring for her granddaughter.  She does experience some stiffness in the toes of her right foot which have been on for over a month.  She has dry eye syndrome, using eye drops, and is reducing carbohydrate intake, focusing on vegetables like broccoli and cauliflower, while avoiding foods like spinach that cause discomfort.    Past  Medical History:  Diagnosis Date   Active smoker    Allergy    Anemia    past hx    Arthritis    Back pain    Carpal tunnel syndrome    LEFT HAND   DJD (degenerative joint disease), lumbar    Hypercholesterolemia    Hypertension    Kidney stone    Pinched nerve in neck     Past Surgical History:  Procedure Laterality Date   APPENDECTOMY     BREAST BIOPSY Right 07/02/2018   COLONOSCOPY     POLYPECTOMY     ROTATOR CUFF REPAIR     TOTAL HIP ARTHROPLASTY Left 04/08/2016   Procedure: LEFT TOTAL HIP ARTHROPLASTY ANTERIOR APPROACH;  Surgeon: Donnice Car, MD;  Location: [facility];  Service: Orthopedics;  Laterality: Left;  Requests 70 mins   TOTAL HIP ARTHROPLASTY Right 07/08/2016   Procedure: RIGHT TOTAL HIP ARTHROPLASTY ANTERIOR APPROACH;  Surgeon: Car Donnice, MD;  Location: [facility];  Service: Orthopedics;  Laterality: Right;    Family History  Problem Relation Age of Onset   Diabetes Mother    Hypertension Mother    Heart failure Mother    Congestive Heart Failure Mother    Prostate cancer Father    Diabetes Father    Diabetes Sister    Congestive Heart Failure Sister    Heart disease Sister    Diabetes Brother        3 brothers deceased - CHF   Heart failure Brother        pacemaker   Heart failure Brother    Diabetes Brother    Heart disease Brother  Breast cancer Maternal Grandmother    Colon cancer Neg Hx    Rectal cancer Neg Hx    Esophageal cancer Neg Hx    Liver cancer Neg Hx    Colon polyps Neg Hx     Social History   Socioeconomic History   Marital status: Single    Spouse name: Not on file   Number of children: 2   Years of education: Not on file   Highest education level: Not on file  Occupational History   Occupation: disabled  Tobacco Use   Smoking status: Former    Current packs/day: 0.00    Types: Cigarettes    Quit date: 06/01/2023    Years since quitting: 0.3   Smokeless tobacco: Never   Tobacco comments:    1 pack every 3 days   Substance and Sexual Activity   Alcohol use: Yes    Alcohol/week: 2.0 standard drinks of alcohol    Types: 2 Shots of liquor per week    Comment: occ    Drug use: Yes    Types: Marijuana   Sexual activity: Yes    Birth control/protection: Surgical  Other Topics Concern   Not on file  Social History Narrative   Not on file   Social Drivers of Health   Financial Resource Strain: Low Risk  (09/16/2023)   Overall Financial Resource Strain (CARDIA)    Difficulty of Paying Living Expenses: Not very hard  Food Insecurity: No Food Insecurity (09/16/2023)   Hunger Vital Sign    Worried About Running Out of Food in the Last Year: Never true    Ran Out of Food in the Last Year: Never true  Transportation Needs: No Transportation Needs (09/16/2023)   PRAPARE - Administrator, Civil Service (Medical): No    Lack of Transportation (Non-Medical): No  Physical Activity: Sufficiently Active (09/16/2023)   Exercise Vital Sign    Days of Exercise per Week: 7 days    Minutes of Exercise per Session: 30 min  Stress: No Stress Concern Present (09/16/2023)   [employer] of Occupational Health - Occupational Stress Questionnaire    Feeling of Stress: Not at all  Social Connections: Socially Isolated (09/16/2023)   Social Connection and Isolation Panel    Frequency of Communication with Friends and Family: More than three times a week    Frequency of Social Gatherings with Friends and Family: Three times a week    Attends Religious Services: Never    Active Member of Clubs or Organizations: No    Attends Banker Meetings: Never    Marital Status: Never married    Allergies  Allergen Reactions   Beeswax    Citrus Itching    Peaches  Oranges    Hydrocodone Other (See Comments)    Causes Headaches   Metronidazole  Itching   Mobic [Meloxicam] Other (See Comments)    Causes Headaches   Tetracyclines & Related Swelling    All over body swelling    Outpatient  Medications Prior to Visit  Medication Sig Dispense Refill   aspirin  EC 81 MG tablet Take 1 tablet (81 mg total) by mouth daily. Swallow whole. 90 tablet 3   Cholecalciferol (VITAMIN D3 PO) Take by mouth.     cyclobenzaprine  (FLEXERIL ) 10 MG tablet Take 1 tablet (10 mg total) by mouth at bedtime as needed for muscle spasms. 90 tablet 1   diclofenac  Sodium (VOLTAREN ) 1 % GEL Apply 2 grams topically 4 (four) times daily.  100 g 1   EPINEPHrine  0.3 mg/0.3 mL IJ SOAJ injection Inject 0.3 mg into the muscle as needed for anaphylaxis. 2 each 0   guaiFENesin  (MUCINEX ) 600 MG 12 hr tablet Take 1 tablet (600 mg total) by mouth 2 (two) times daily. 20 tablet 0   hydrocortisone  2.5 % cream Apply topically to affected areas up to 2 (two) times daily as needed. 30 g 11   ketoconazole  (NIZORAL ) 2 % cream Apply topically daily. 60 g 11   pravastatin  (PRAVACHOL ) 80 MG tablet Take 1 tablet (80 mg total) by mouth every evening. 90 tablet 3   tacrolimus  (PROTOPIC ) 0.1 % ointment Apply 1 Application topically 2 (two) times a day. 100 g 6   triamcinolone  cream (KENALOG ) 0.1 % Apply 1 application topically 2 (two) times daily. 30 g 1   lisinopril -hydrochlorothiazide  (ZESTORETIC ) 10-12.5 MG tablet TAKE 1 TABLET BY MOUTH ONCE DAILY. 90 tablet 1   ibuprofen  (ADVIL ) 600 MG tablet Take 1 tablet (600 mg total) by mouth every 8 (eight) hours as needed. (Patient not taking: Reported on 09/24/2023) 60 tablet 0   nicotine  (NICODERM CQ ) 7 mg/24hr patch Place 1 patch (7 mg total) onto the skin daily. (Patient not taking: Reported on 09/24/2023) 28 patch 3   PREDNISONE  PO Take 40 mg by mouth daily. (Patient not taking: Reported on 09/24/2023)     No facility-administered medications prior to visit.     ROS Review of Systems  Constitutional:  Negative for activity change and appetite change.  HENT:  Negative for sinus pressure and sore throat.   Respiratory:  Negative for chest tightness, shortness of breath and wheezing.    Cardiovascular:  Negative for chest pain and palpitations.  Gastrointestinal:  Negative for abdominal distention, abdominal pain and constipation.  Genitourinary: Negative.   Musculoskeletal:        See HPI  Psychiatric/Behavioral:  Negative for behavioral problems and dysphoric mood.     Objective:  BP (!) 151/76   Pulse 72   Ht 5' 7 (1.702 m)   Wt 254 lb (115.2 kg)   SpO2 100%   BMI 39.78 kg/m      09/24/2023   11:26 AM 09/24/2023   10:46 AM 09/16/2023    9:13 AM  BP/Weight  Systolic BP 151 162   Diastolic BP 76 88   Wt. (Lbs)  254 249  BMI  39.78 kg/m2 39 kg/m2      Physical Exam Constitutional:      Appearance: She is well-developed.  Cardiovascular:     Rate and Rhythm: Normal rate.     Heart sounds: Normal heart sounds. No murmur heard. Pulmonary:     Effort: Pulmonary effort is normal.     Breath sounds: Normal breath sounds. No wheezing or rales.  Chest:     Chest wall: No tenderness.  Abdominal:     General: Bowel sounds are normal. There is no distension.     Palpations: Abdomen is soft. There is no mass.     Tenderness: There is no abdominal tenderness.  Musculoskeletal:     Right lower leg: No edema.     Left lower leg: No edema.     Comments: Slight tenderness on range of motion of right knee.  Knee brace in place Right foot is normal with no tenderness on palpation or range of motion Left lower extremity is normal  Neurological:     Mental Status: She is alert and oriented to person, place, and time.  Psychiatric:  Mood and Affect: Mood normal.        Latest Ref Rng & Units 03/30/2023    9:51 AM 07/24/2022    9:52 AM 01/16/2022    3:26 PM  CMP  Glucose 70 - 99 mg/dL 99  97  885   BUN 8 - 27 mg/dL 21  18  17    Creatinine 0.57 - 1.00 mg/dL 8.87  9.03  8.90   Sodium 134 - 144 mmol/L 145  144  143   Potassium 3.5 - 5.2 mmol/L 4.6  4.6  4.4   Chloride 96 - 106 mmol/L 108  109  104   CO2 20 - 29 mmol/L 22  21  23    Calcium  8.7 - 10.3  mg/dL 9.6  9.6  89.6   Total Protein 6.0 - 8.5 g/dL 6.5  6.5  7.1   Total Bilirubin 0.0 - 1.2 mg/dL 0.2  <9.7  0.2   Alkaline Phos 44 - 121 IU/L 111  117  120   AST 0 - 40 IU/L 10  10  11    ALT 0 - 32 IU/L 13  9  8      Lipid Panel     Component Value Date/Time   CHOL 173 03/30/2023 0951   TRIG 75 03/30/2023 0951   HDL 66 03/30/2023 0951   CHOLHDL 2.7 07/12/2020 1158   LDLCALC 93 03/30/2023 0951    CBC    Component Value Date/Time   WBC 14.6 (H) 01/16/2022 1526   WBC 14.2 (H) 07/09/2016 0509   RBC 4.44 01/16/2022 1526   RBC 3.10 (L) 07/09/2016 0509   HGB 13.1 01/16/2022 1526   HCT 40.2 01/16/2022 1526   PLT 452 (H) 01/16/2022 1526   MCV 91 01/16/2022 1526   MCH 29.5 01/16/2022 1526   MCH 29.0 07/09/2016 0509   MCHC 32.6 01/16/2022 1526   MCHC 33.0 07/09/2016 0509   RDW 13.2 01/16/2022 1526   LYMPHSABS 5.1 (H) 01/16/2022 1526   EOSABS 0.2 01/16/2022 1526   BASOSABS 0.1 01/16/2022 1526    Lab Results  Component Value Date   HGBA1C 5.9 09/24/2023       Assessment & Plan Hypertension Blood pressure remains elevated. Target BP <130/80 to reduce cardiovascular risk. - Increase lisinopril -hydrochlorothiazide  to 20/25 mg daily. - Encourage home blood pressure monitoring. - Advise continuation of current medications including pravastatin  and aspirin .   Atherosclerotic heart disease with coronary artery calcification Advanced coronary artery calcification on CT. Cardiologist managing. Smoking cessation achieved. - Continue pravastatin  and low-dose aspirin  as prescribed. - Encourage lifestyle modifications including smoking cessation and dietary changes.  Osteoarthritis of right foot  Chronic pain due to osteoarthritis. Uses Voltaren  gel and knee brace for relief. - Continue use of Voltaren  gel. - Encourage use of knee brace. - Advise on regular exercise as tolerated.   Prediabetes - Encourage dietary changes to reduce carbohydrate intake and increase  vegetables. - Advise moderation in starchy foods.       Healthcare maintenance Up-to-date on colon cancer screening, colon cancer screening, breast cancer screening  Meds ordered this encounter  Medications   lisinopril -hydrochlorothiazide  (ZESTORETIC ) 20-25 MG tablet    Sig: Take 1 tablet by mouth daily.    Dispense:  90 tablet    Refill:  1    Dose increased    Follow-up: No follow-ups on file.       Corrina Sabin, MD, FAAFP. [facility] [place], [place] [phone]   09/24/2023, 11:30 AM

## 2023-09-25 ENCOUNTER — Ambulatory Visit: Payer: Self-pay | Admitting: Family Medicine

## 2023-09-25 LAB — CMP14+EGFR
ALT: 15 IU/L (ref 0–32)
AST: 13 IU/L (ref 0–40)
Albumin: 4.4 g/dL (ref 3.9–4.9)
Alkaline Phosphatase: 108 IU/L (ref 44–121)
BUN/Creatinine Ratio: 16 (ref 12–28)
BUN: 18 mg/dL (ref 8–27)
Bilirubin Total: 0.2 mg/dL (ref 0.0–1.2)
CO2: 21 mmol/L (ref 20–29)
Calcium: 10.1 mg/dL (ref 8.7–10.3)
Chloride: 104 mmol/L (ref 96–106)
Creatinine, Ser: 1.12 mg/dL — ABNORMAL HIGH (ref 0.57–1.00)
Globulin, Total: 2.5 g/dL (ref 1.5–4.5)
Glucose: 99 mg/dL (ref 70–99)
Potassium: 4.7 mmol/L (ref 3.5–5.2)
Sodium: 142 mmol/L (ref 134–144)
Total Protein: 6.9 g/dL (ref 6.0–8.5)
eGFR: 55 mL/min/1.73 — ABNORMAL LOW (ref >59)

## 2023-10-21 ENCOUNTER — Ambulatory Visit: Payer: Self-pay

## 2023-10-21 NOTE — Telephone Encounter (Signed)
 Noted

## 2023-10-21 NOTE — Telephone Encounter (Signed)
 FYI Only or Action Required?: Action required by provider: clinical question for provider and update on patient condition.  Patient was last seen in primary care on 09/24/2023 by Delbert Clam, MD.  Called Nurse Triage reporting Dizziness.  Symptoms began yesterday.  Interventions attempted: Other: ate a piece of candy, drank cold water and reduced her lisinopril -hydrochlorothiazide  back to her original dosage.  Symptoms are: lightheaded/dizziness (mild) gradually improving.  Triage Disposition: Call PCP Now (overriding See Physician Within 24 Hours)  Patient/caregiver understands and will follow disposition?: Yes        Copied from CRM [id number]. Topic: Clinical - Red Word Triage >> Oct 21, 2023  3:26 PM Dedra B wrote: Red Word that prompted transfer to Nurse Triage: Pt called saying that she experienced dizziness and lightheadedness after taking her new lisinopril  dosage. She doesn't know if the new lisinopril  dosage is too high. Warm transfer to nurse triage. Reason for Disposition  Taking a medicine that could cause dizziness (e.g., blood pressure medications, diuretics)  Answer Assessment - Initial Assessment Questions 1. DESCRIPTION: Describe your dizziness.     Not to the point falling down or stumbling, like I'm siting down and get a little dizzy  2. LIGHTHEADED: Do you feel lightheaded? (e.g., somewhat faint, woozy, weak upon standing)     Yes.  3. VERTIGO: Do you feel like either you or the room is spinning or tilting? (i.e., vertigo)     No.  4. SEVERITY: How bad is it?  Do you feel like you are going to faint? Can you stand and walk?     Mild. She is able to stand and walk.  5. ONSET:  When did the dizziness begin?     Yesterday.  6. AGGRAVATING FACTORS: Does anything make it worse? (e.g., standing, change in head position)     No.  7. HEART RATE: Can you tell me your heart rate? How many beats in 15 seconds?  (Note: Not all patients  can do this.)       No.  8. CAUSE: What do you think is causing the dizziness? (e.g., decreased fluids or food, diarrhea, emotional distress, heat exposure, new medicine, sudden standing, vomiting; unknown)     Increase in lisinopril -hydrochlorothiazide  dosage (which she has been on sing 09/24/23). She states she thought it might have been her blood sugar or fluid intake so she ate a piece of candy and drank some cold water yesterday when she started to feel lightheaded. She states she took her old dosage of lisinopril -hydrochlorothiazide  today and feels it helped. She thinks the dosage may be too strong since she has made changes to her diet.  9. RECURRENT SYMPTOM: Have you had dizziness before? If Yes, ask: When was the last time? What happened that time?     No.  10. OTHER SYMPTOMS: Do you have any other symptoms? (e.g., fever, chest pain, vomiting, diarrhea, bleeding)       Denies chest pain, difficulty breathing, bleeding, nausea, vomiting, fever, changes in speech or vision, unilateral numbness or weakness.  11. PREGNANCY: Is there any chance you are pregnant? When was your last menstrual period?       N/A.  Protocols used: Dizziness - Lightheadedness-A-AH

## 2023-10-27 ENCOUNTER — Encounter: Payer: Self-pay | Admitting: Nurse Practitioner

## 2023-10-27 ENCOUNTER — Ambulatory Visit: Attending: Nurse Practitioner | Admitting: Nurse Practitioner

## 2023-10-27 VITALS — BP 121/73 | HR 77 | Resp 19 | Ht 67.0 in | Wt 246.8 lb

## 2023-10-27 DIAGNOSIS — I10 Essential (primary) hypertension: Secondary | ICD-10-CM | POA: Diagnosis not present

## 2023-10-27 NOTE — Progress Notes (Signed)
 Assessment & Plan:  Kimberly Sosa was seen today for medication dose change.  Diagnoses and all orders for this visit:  Primary hypertension Current medication regimen achieving target blood pressure.   Interested in natural remedies and lifestyle modifications. - Resume lisinopril /hydrochlorothiazide  10/12.5 mg dosage. - Monitor blood pressure daily with home machine. - Consider adding amlodipine if uncontrolled. - Discuss natural remedies and lifestyle modifications. - Document blood pressure readings and communicate with PCP - Schedule follow-up video visit with PCP in 4 weeks.  Chronic right knee pain with intra-articular loose body Pain due to intra-articular loose body, likely from past injury. - Consider referral to orthopedic surgery for evaluation and potential arthroscopy. - Recommend use of copper sleeve for knee support during activity.    General Health Maintenance Engaged in lifestyle modifications including weight loss, low-carb diet, and increased physical activity. Discussed importance of balanced nutrition and regular exercise. - Encourage continuation of low-carb diet with balanced nutrition. - Promote regular physical activity, aiming for at least an hour of walking several times a week. - Advise on healthy cooking methods such as air frying and using olive oil   Patient has been counseled on age-appropriate routine health concerns for screening and prevention. These are reviewed and up-to-date. Referrals have been placed accordingly. Immunizations are up-to-date or declined.    Subjective:   Chief Complaint  Patient presents with   Medication Dose Change    Patient states with higher dosage change to lisinopril - htz, symptoms of lightheadedness occurred. Would like to go back to lower dose.      History of Present Illness Kimberly Sosa is a 63 year old female with hypertension who presents for management of her blood pressure medications.  She is a  patient of Dr. Delbert    HTN Her blood pressure has been elevated since May 2025. She lacks a home blood pressure monitor. She was switched from lisinopril /hydrochlorothiazide  10/12.5 mg to lisinopril /hydrochlorothiazide   20/25 on 09-24-2023 by her PCP.  She experienced and episode of dizziness, which she believed was related to her current blood pressure lisinopril  and hydrochlorothiazide  being recently increased. She stopped taking the 20-25 dose and started back on the 10-12.5 mg dose due to her symptoms. Blood pressure is controlled.  She is interested in incorporating natural remedies such as cinnamon, magnesium , and lemon juice alongside her medication regimen. She plans to obtain a blood pressure machine soon for regular monitoring.She has modified her diet to be low in carbohydrates, focusing on portion control and healthy cooking methods such as using an air fryer and olive oil. She has also started walking for exercise, although she has a bad knee, which limits her activity. BP Readings from Last 3 Encounters:  10/27/23 121/73  09/24/23 (!) 151/76  07/21/23 (!) 142/75    She has a history of a knee injury from when she was [age], resulting in a floating bone fragment that occasionally causes her leg to 'drop.' She uses a brace for support when walking. No significant swelling in the knee recently.   Review of Systems  Constitutional:  Negative for fever, malaise/fatigue and weight loss.  HENT: Negative.  Negative for nosebleeds.   Eyes: Negative.  Negative for blurred vision, double vision and photophobia.  Respiratory: Negative.  Negative for cough and shortness of breath.   Cardiovascular: Negative.  Negative for chest pain, palpitations and leg swelling.  Gastrointestinal: Negative.  Negative for heartburn, nausea and vomiting.  Musculoskeletal:  Positive for joint pain. Negative for myalgias.  Neurological:  Positive for dizziness. Negative for focal weakness, seizures and  headaches.  Psychiatric/Behavioral: Negative.  Negative for suicidal ideas.     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   Procedure: LEFT TOTAL HIP ARTHROPLASTY ANTERIOR APPROACH;  Surgeon: Donnice Car, MD;  Location: [facility];  Service: Orthopedics;  Laterality: Left;  Requests 70 mins   TOTAL HIP ARTHROPLASTY Right 07/08/2016   Procedure: RIGHT TOTAL HIP ARTHROPLASTY ANTERIOR APPROACH;  Surgeon: Car Donnice, MD;  Location: [facility];  Service: Orthopedics;  Laterality: Right;    Family History  Problem Relation Age of Onset   Diabetes Mother    Hypertension Mother    Heart failure Mother    Congestive Heart Failure Mother    Prostate cancer Father    Diabetes Father    Diabetes Sister    Congestive Heart Failure Sister    Heart disease Sister    Diabetes Brother        3 brothers deceased - CHF   Heart failure Brother        pacemaker   Heart failure Brother    Diabetes Brother    Heart disease Brother    Breast cancer Maternal Grandmother    Colon cancer Neg Hx    Rectal cancer Neg Hx    Esophageal cancer Neg Hx    Liver cancer Neg Hx    Colon polyps Neg Hx     Social History Reviewed with no changes to be made today.   Outpatient Medications Prior to Visit  Medication Sig Dispense Refill   aspirin  EC 81 MG tablet Take 1 tablet (81 mg total) by mouth daily. Swallow whole. 90 tablet 3   Cholecalciferol (VITAMIN D3 PO) Take by mouth.     cyclobenzaprine  (FLEXERIL ) 10 MG tablet Take 1 tablet (10 mg total) by mouth at bedtime  as needed for muscle spasms. 90 tablet 1   diclofenac  Sodium (VOLTAREN ) 1 % GEL Apply 2 grams topically 4 (four) times daily. 100 g 1   EPINEPHrine  0.3 mg/0.3 mL IJ SOAJ injection Inject 0.3 mg into the muscle as needed for anaphylaxis. 2 each 0   hydrocortisone  2.5 % cream Apply topically to affected areas up to 2 (two) times daily as needed. 30 g 11   ketoconazole  (NIZORAL ) 2 % cream Apply topically daily. 60 g 11   lisinopril -hydrochlorothiazide  (ZESTORETIC ) 20-25 MG tablet Take 1 tablet by mouth daily. 90 tablet 1   pravastatin  (PRAVACHOL ) 80 MG tablet Take 1 tablet (80 mg total) by mouth every evening. 90 tablet 3   tacrolimus  (PROTOPIC ) 0.1 % ointment Apply 1 Application topically 2 (two) times a day. 100 g 6   triamcinolone  cream (KENALOG ) 0.1 % Apply 1 application topically 2 (two) times daily. 30 g 1   guaiFENesin  (MUCINEX ) 600 MG 12 hr tablet Take 1 tablet (600 mg total) by mouth 2 (two)  times daily. (Patient not taking: Reported on 10/27/2023) 20 tablet 0   No facility-administered medications prior to visit.    Allergies  Allergen Reactions   Beeswax    Citrus Itching    Peaches  Oranges    Hydrocodone Other (See Comments)    Causes Headaches   Metronidazole  Itching   Mobic [Meloxicam] Other (See Comments)    Causes Headaches   Tetracyclines & Related Swelling    All over body swelling       Objective:    BP 121/73 (BP Location: Left Arm, Patient Position: Sitting, Cuff Size: Normal)   Pulse 77   Resp 19   Ht 5' 7 (1.702 m)   Wt 246 lb 12.8 oz (111.9 kg)   SpO2 98%   BMI 38.65 kg/m  Wt Readings from Last 3 Encounters:  10/27/23 246 lb 12.8 oz (111.9 kg)  09/24/23 254 lb (115.2 kg)  09/16/23 249 lb (112.9 kg)    Physical Exam Vitals and nursing note reviewed.  Constitutional:      Appearance: She is well-developed.  HENT:     Head: Normocephalic and atraumatic.  Cardiovascular:     Rate and Rhythm: Normal rate and regular rhythm.     Heart sounds:  Normal heart sounds. No murmur heard.    No friction rub. No gallop.  Pulmonary:     Effort: Pulmonary effort is normal. No tachypnea or respiratory distress.     Breath sounds: Normal breath sounds. No decreased breath sounds, wheezing, rhonchi or rales.  Chest:     Chest wall: No tenderness.  Musculoskeletal:        General: Normal range of motion.     Cervical back: Normal range of motion.  Skin:    General: Skin is warm and dry.  Neurological:     Mental Status: She is alert and oriented to person, place, and time.     Coordination: Coordination normal.  Psychiatric:        Behavior: Behavior normal. Behavior is cooperative.        Thought Content: Thought content normal.        Judgment: Judgment normal.          Patient has been counseled extensively about nutrition and exercise as well as the importance of adherence with medications and regular follow-up. The patient was given clear instructions to go to ER or return to medical center if symptoms don't improve, worsen or new problems develop. The patient verbalized understanding.   Follow-up: Return in about 4 weeks (around 11/24/2023) for virtual bp check with NEWLIN.   Haze LELON Servant, FNP-BC [facility] [place], [place] [phone]   10/27/2023, 1:04 PM

## 2023-11-18 ENCOUNTER — Other Ambulatory Visit: Payer: Self-pay

## 2023-11-18 ENCOUNTER — Telehealth: Payer: Self-pay | Admitting: Family Medicine

## 2023-11-18 DIAGNOSIS — I10 Essential (primary) hypertension: Secondary | ICD-10-CM

## 2023-11-18 MED ORDER — LISINOPRIL-HYDROCHLOROTHIAZIDE 10-12.5 MG PO TABS
1.0000 | ORAL_TABLET | Freq: Every day | ORAL | 3 refills | Status: AC
  Filled 2023-11-18: qty 90, 90d supply, fill #0
  Filled 2024-02-16: qty 90, 90d supply, fill #1

## 2023-11-18 NOTE — Telephone Encounter (Signed)
 Copied from CRM #[id number]. Topic: Clinical - Medication Question >> Nov 18, 2023 10:35 AM Hadassah PARAS wrote: Reason for CRM: Pt was prescribed a new dosage for Lisinorpil 20/25 and it was too strong. She was later advised to switch over to the old Lisinopril  10/12.5. She is now running out of that medication and is requesting a new prescription/refill for that.

## 2023-11-18 NOTE — Telephone Encounter (Signed)
 Rx sent for new dose Lisinopril0 HCTZ 10/12.5mg .

## 2023-11-23 ENCOUNTER — Other Ambulatory Visit: Payer: Self-pay

## 2023-11-24 ENCOUNTER — Telehealth (HOSPITAL_BASED_OUTPATIENT_CLINIC_OR_DEPARTMENT_OTHER): Admitting: Family Medicine

## 2023-11-24 DIAGNOSIS — I10 Essential (primary) hypertension: Secondary | ICD-10-CM

## 2023-11-24 NOTE — Patient Instructions (Signed)
 VISIT SUMMARY:  Today, we discussed your blood pressure management. You reported dizziness with your previous medication dose, so we adjusted it to lisinopril -hydrochlorothiazide  10/12.5 mg, which you are tolerating well. Your home blood pressure readings are within the target range. You have also made positive lifestyle changes, including dietary adjustments and regular physical activity.  YOUR PLAN:  -HYPERTENSION: Hypertension, or high blood pressure, is when the force of the blood against your artery walls is too high. Your blood pressure is well-controlled with your current medication, lisinopril -hydrochlorothiazide  10/12.5 mg, which you should continue to take once daily. Please keep monitoring your blood pressure at home and bring your monitor to your visits. Continue with your healthy lifestyle changes, including your diet and exercise.  INSTRUCTIONS:  Please continue to monitor your blood pressure at home and bring your monitor to your next visit.

## 2023-11-24 NOTE — Progress Notes (Signed)
 Virtual Visit via Video Note  I connected with Kimberly Sosa, on 11/24/2023 at 9:19 AM by video enabled telemedicine device and verified that I am speaking with the correct person using two identifiers.   Consent: I discussed the limitations, risks, security and privacy concerns of performing an evaluation and management service by telemedicine and the availability of in person appointments. I also discussed with the patient that there may be a patient responsible charge related to this service. The patient expressed understanding and agreed to proceed.   Location of Patient: Home  Location of Provider: Clinic   Persons participating in Telemedicine visit: Avaeh Curtis Uriarte Dr. Delbert    Discussed the use of AI scribe software for clinical note transcription with the patient, who gave verbal consent to proceed.  History of Present Illness Kimberly Sosa is a 63 year old female with a history of  hypertension, hypercholesterolemia, degenerative disease of the lumbar spine, nicotine  dependence (quit in 05/2023) , knee osteoarthritis, Prediabetes  and coronary artery disease  who presents for blood pressure management.  She experienced dizziness with her previous dose of lisinopril /hydrochlorothiazide  20/25 mg, prompting a change to lisinopril -hydrochlorothiazide  10/12.5 mg at the last visit with the nurse practitioner. She adheres to this regimen and recently refilled her prescription. Home blood pressure readings since November 19, 2023, are 116/73 mmHg, 110/75 mmHg, and 119/74 mmHg.  She has made dietary changes, choosing brown bread and brown rice while avoiding white carbohydrates. She engages in physical activity, including walking and exercise techniques, and cares for her two-year-old granddaughter.      Past Medical History:  Diagnosis Date   Active smoker    Allergy    Anemia    past hx    Arthritis    Back pain    Carpal tunnel syndrome    LEFT  HAND   DJD (degenerative joint disease), lumbar    Hypercholesterolemia    Hypertension    Kidney stone    Pinched nerve in neck    Allergies  Allergen Reactions   Beeswax    Citrus Itching    Peaches  Oranges    Hydrocodone Other (See Comments)    Causes Headaches   Metronidazole  Itching   Mobic [Meloxicam] Other (See Comments)    Causes Headaches   Tetracyclines & Related Swelling    All over body swelling    Current Outpatient Medications on File Prior to Visit  Medication Sig Dispense Refill   aspirin  EC 81 MG tablet Take 1 tablet (81 mg total) by mouth daily. Swallow whole. 90 tablet 3   Cholecalciferol (VITAMIN D3 PO) Take by mouth.     cyclobenzaprine  (FLEXERIL ) 10 MG tablet Take 1 tablet (10 mg total) by mouth at bedtime as needed for muscle spasms. 90 tablet 1   diclofenac  Sodium (VOLTAREN ) 1 % GEL Apply 2 grams topically 4 (four) times daily. 100 g 1   EPINEPHrine  0.3 mg/0.3 mL IJ SOAJ injection Inject 0.3 mg into the muscle as needed for anaphylaxis. 2 each 0   guaiFENesin  (MUCINEX ) 600 MG 12 hr tablet Take 1 tablet (600 mg total) by mouth 2 (two) times daily. (Patient not taking: Reported on 10/27/2023) 20 tablet 0   hydrocortisone  2.5 % cream Apply topically to affected areas up to 2 (two) times daily as needed. 30 g 11   ketoconazole  (NIZORAL ) 2 % cream Apply topically daily. 60 g 11   lisinopril -hydrochlorothiazide  (ZESTORETIC ) 10-12.5 MG tablet Take 1 tablet by mouth daily. 90  tablet 3   pravastatin  (PRAVACHOL ) 80 MG tablet Take 1 tablet (80 mg total) by mouth every evening. 90 tablet 3   tacrolimus  (PROTOPIC ) 0.1 % ointment Apply 1 Application topically 2 (two) times a day. 100 g 6   triamcinolone  cream (KENALOG ) 0.1 % Apply 1 application topically 2 (two) times daily. 30 g 1   No current facility-administered medications on file prior to visit.    ROS: See HPI   Home reported BP readings: 116/73 ; 110/75; 119/74     Observations/Objective: Awake, alert,  oriented x3 Not in acute distress Normal mood      Latest Ref Rng & Units 09/24/2023   11:41 AM 03/30/2023    9:51 AM 07/24/2022    9:52 AM  CMP  Glucose 70 - 99 mg/dL 99  99  97   BUN 8 - 27 mg/dL 18  21  18    Creatinine 0.57 - 1.00 mg/dL 8.87  8.87  9.03   Sodium 134 - 144 mmol/L 142  145  144   Potassium 3.5 - 5.2 mmol/L 4.7  4.6  4.6   Chloride 96 - 106 mmol/L 104  108  109   CO2 20 - 29 mmol/L 21  22  21    Calcium  8.7 - 10.3 mg/dL 89.8  9.6  9.6   Total Protein 6.0 - 8.5 g/dL 6.9  6.5  6.5   Total Bilirubin 0.0 - 1.2 mg/dL 0.2  0.2  <9.7   Alkaline Phos 44 - 121 IU/L 108  111  117   AST 0 - 40 IU/L 13  10  10    ALT 0 - 32 IU/L 15  13  9      Lipid Panel     Component Value Date/Time   CHOL 173 03/30/2023 0951   TRIG 75 03/30/2023 0951   HDL 66 03/30/2023 0951   CHOLHDL 2.7 07/12/2020 1158   LDLCALC 93 03/30/2023 0951   LABVLDL 14 03/30/2023 0951    Lab Results  Component Value Date   HGBA1C 5.9 09/24/2023     Assessment and plan:   Assessment & Plan Hypertension Blood pressure controlled on current lisinopril -hydrochlorothiazide  dose. Previous higher dose caused dizziness. - Continue lisinopril -hydrochlorothiazide  10/12.5 mg orally once daily. - Monitor blood pressure at home and bring monitor to visits. - Maintain lifestyle modifications, including dietary changes and exercise.   There are no diagnoses linked to this encounter.   No orders of the defined types were placed in this encounter.   Follow Up Instructions: Keep previously scheduled appointment   I discussed the assessment and treatment plan with the patient. The patient was provided an opportunity to ask questions and all were answered. The patient agreed with the plan and demonstrated an understanding of the instructions.   The patient was advised to call back or seek an in-person evaluation if the symptoms worsen or if the condition fails to improve as anticipated.     I provided 11  minutes total of Telehealth time during this encounter including median intraservice time, reviewing previous notes, investigations, ordering medications, medical decision making, coordinating care and patient verbalized understanding at the end of the visit.     Corrina Sabin, MD, FAAFP. [facility] [place], [place] [phone]   11/24/2023, 9:19 AM

## 2023-12-21 ENCOUNTER — Other Ambulatory Visit: Payer: Self-pay

## 2023-12-23 ENCOUNTER — Other Ambulatory Visit: Payer: Self-pay

## 2024-01-26 ENCOUNTER — Ambulatory Visit: Payer: Self-pay

## 2024-01-26 ENCOUNTER — Telehealth: Payer: Self-pay | Admitting: Family Medicine

## 2024-01-26 NOTE — Telephone Encounter (Signed)
 FYI Only or Action Required?: FYI only for provider: appointment scheduled on 01/27/24.  Patient was last seen in primary care on 11/24/2023 by Newlin, Enobong, MD.  Called Nurse Triage reporting Rash and Pruritis.  Symptoms began 3 weeks ago.  Interventions attempted: Prescription medications: ketoconazole ; hydrocortisone .  Symptoms are: unchanged.  Triage Disposition: See PCP When Office is Open (Within 3 Days)  Patient/caregiver understands and will follow disposition?: Yes               Message from Kendralyn S sent at 01/26/2024 12:12 PM EST  Reason for Triage: has a rash on forearm that is bothering her badly [phone]   Reason for Disposition  [1] Severe localized itching AND [2] after 2 days of steroid cream  Answer Assessment - Initial Assessment Questions 1. APPEARANCE of RASH: What does the rash look like? (e.g., blisters, dry flaky skin, red spots, redness, sores)     Looks like eczema white scabs. Multiple whelps, scabs and inflammation where she has been scratching.  2. LOCATION: Where is the rash located?      Right arm at antecubital.  3. NUMBER: How many spots are there?      Unable to describe.  4. SIZE: How big are the spots? (e.g., inches, cm; or compare to size of pinhead, tip of pen, eraser, pea)      Pinhead.  5. ONSET: When did the rash start?      X 3 weeks.  6. ITCHING: Does the rash itch? If Yes, ask: How bad is the itch?  (Scale 0-10; or none, mild, moderate, severe)     Yes, not itchy all the time but when it is itchy: severe. Treating with hydrocortisone  and ketoconazole .  7. PAIN: Does the rash hurt? If Yes, ask: How bad is the pain?  (Scale 0-10; or none, mild, moderate, severe)     No.  8. OTHER SYMPTOMS: Do you have any other symptoms? (e.g., fever)     No difficulty breathing, fever.  She states she thought it could be an allergic reaction. 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  Protocols used: Rash or Redness - Localized-A-AH

## 2024-01-26 NOTE — Telephone Encounter (Signed)
 Copied from CRM #[id number]. Topic: Referral - Request for Referral >> Jan 26, 2024 12:06 PM Antony RAMAN wrote:  Did the patient discuss referral with their provider in the last year? Yes (If No - schedule appointment) (If Yes - send message)  Appointment offered? No  Type of order/referral and detailed reason for visit: dermatologist/ rash on forearm  Preference of office, provider, location: [facility]   If referral order, have you been seen by this specialty before? Yes (If Yes, this issue or another issue? When? Where?  Can we respond through MyChart? No

## 2024-01-27 ENCOUNTER — Encounter: Payer: Self-pay | Admitting: Family Medicine

## 2024-01-27 ENCOUNTER — Other Ambulatory Visit: Payer: Self-pay

## 2024-01-27 ENCOUNTER — Ambulatory Visit (HOSPITAL_BASED_OUTPATIENT_CLINIC_OR_DEPARTMENT_OTHER): Payer: Self-pay | Admitting: Family Medicine

## 2024-01-27 DIAGNOSIS — R21 Rash and other nonspecific skin eruption: Secondary | ICD-10-CM

## 2024-01-27 MED ORDER — TRIAMCINOLONE ACETONIDE 0.1 % EX CREA
1.0000 | TOPICAL_CREAM | Freq: Two times a day (BID) | CUTANEOUS | 0 refills | Status: AC
  Filled 2024-01-27: qty 30, 15d supply, fill #0

## 2024-01-27 NOTE — Progress Notes (Signed)
 "  Virtual Visit via Video Note  I connected with Kimberly Sosa, on 01/27/2024 at 11:38 AM by video enabled telemedicine device and verified that I am speaking with the correct person using two identifiers.   Consent: I discussed the limitations, risks, security and privacy concerns of performing an evaluation and management service by telemedicine and the availability of in person appointments. I also discussed with the patient that there may be a patient responsible charge related to this service. The patient expressed understanding and agreed to proceed.   Location of Patient: Home  Location of Provider: Clinic   Persons participating in Telemedicine visit: Kimberly Sosa Dr. Delbert    Discussed the use of AI scribe software for clinical note transcription with the patient, who gave verbal consent to proceed.  History of Present Illness Kimberly Sosa is a 63 year old female  with a history of  hypertension, hypercholesterolemia, degenerative disease of the lumbar spine, nicotine  dependence (quit in 05/2023) , knee osteoarthritis, Prediabetes  and coronary artery disease who presents with a persistent itchy rash on her right arm.  The rash began about three weeks ago and is localized to the front of her right arm near the mid-elbow. It is itchy with scabbing from scratching. She applies ketoconazole  and prescription-strength hydrocortisone  cream three to four times daily, which helps for only 30 to 60 minutes. She suspects a citrus allergy and has avoided lemon juice since the rash started.      Past Medical History:  Diagnosis Date   Active smoker    Allergy    Anemia    past hx    Arthritis    Back pain    Carpal tunnel syndrome    LEFT HAND   DJD (degenerative joint disease), lumbar    Hypercholesterolemia    Hypertension    Kidney stone    Pinched nerve in neck    Allergies[1]  Medications Ordered Prior to Encounter[2]  ROS: See  HPI  Observations/Objective: Awake, alert, oriented x3 Not in acute distress Skin with rough rash on lateral aspect of right arm with sparse hypopigmented dots Normal mood      Latest Ref Rng & Units 09/24/2023   11:41 AM 03/30/2023    9:51 AM 07/24/2022    9:52 AM  CMP  Glucose 70 - 99 mg/dL 99  99  97   BUN 8 - 27 mg/dL 18  21  18    Creatinine 0.57 - 1.00 mg/dL 8.87  8.87  9.03   Sodium 134 - 144 mmol/L 142  145  144   Potassium 3.5 - 5.2 mmol/L 4.7  4.6  4.6   Chloride 96 - 106 mmol/L 104  108  109   CO2 20 - 29 mmol/L 21  22  21    Calcium  8.7 - 10.3 mg/dL 89.8  9.6  9.6   Total Protein 6.0 - 8.5 g/dL 6.9  6.5  6.5   Total Bilirubin 0.0 - 1.2 mg/dL 0.2  0.2  <9.7   Alkaline Phos 44 - 121 IU/L 108  111  117   AST 0 - 40 IU/L 13  10  10    ALT 0 - 32 IU/L 15  13  9      Lipid Panel     Component Value Date/Time   CHOL 173 03/30/2023 0951   TRIG 75 03/30/2023 0951   HDL 66 03/30/2023 0951   CHOLHDL 2.7 07/12/2020 1158   LDLCALC 93 03/30/2023 0951  LABVLDL 14 03/30/2023 0951    Lab Results  Component Value Date   HGBA1C 5.9 09/24/2023     Assessment and plan:  Assessment & Plan Dermatitis of the right arm Dermatitis only partially responsive to ketoconazole  and hydrocortisone . Possible eczema. Cause unclear due to video consultation limitations. - Prescribed triamcinolone  cream for right arm. - Instructed to discontinue ketoconazole  and hydrocortisone . - Advised to stop triamcinolone  once resolved.     Meds ordered this encounter  Medications   triamcinolone  cream (KENALOG ) 0.1 %    Sig: Apply 1 Application topically 2 (two) times daily.    Dispense:  30 g    Refill:  0    Follow Up Instructions: Keep previously scheduled appointment   I discussed the assessment and treatment plan with the patient. The patient was provided an opportunity to ask questions and all were answered. The patient agreed with the plan and demonstrated an understanding of the  instructions.   The patient was advised to call back or seek an in-person evaluation if the symptoms worsen or if the condition fails to improve as anticipated.     I provided 12 minutes total of Telehealth time during this encounter including median intraservice time, reviewing previous notes, investigations, ordering medications, medical decision making, coordinating care and patient verbalized understanding at the end of the visit.     Corrina Sabin, MD, FAAFP. [facility] [place], [place] [phone]   01/27/2024, 11:38 AM     [1]  Allergies Allergen Reactions   Beeswax    Citrus Itching    Peaches  Oranges    Hydrocodone Other (See Comments)    Causes Headaches   Metronidazole  Itching   Mobic [Meloxicam] Other (See Comments)    Causes Headaches   Tetracyclines & Related Swelling    All over body swelling  [2]  Current Outpatient Medications on File Prior to Visit  Medication Sig Dispense Refill   aspirin  EC 81 MG tablet Take 1 tablet (81 mg total) by mouth daily. Swallow whole. 90 tablet 3   Cholecalciferol (VITAMIN D3 PO) Take by mouth.     cyclobenzaprine  (FLEXERIL ) 10 MG tablet Take 1 tablet (10 mg total) by mouth at bedtime as needed for muscle spasms. 90 tablet 1   diclofenac  Sodium (VOLTAREN ) 1 % GEL Apply 2 grams topically 4 (four) times daily. 100 g 1   EPINEPHrine  0.3 mg/0.3 mL IJ SOAJ injection Inject 0.3 mg into the muscle as needed for anaphylaxis. 2 each 0   guaiFENesin  (MUCINEX ) 600 MG 12 hr tablet Take 1 tablet (600 mg total) by mouth 2 (two) times daily. (Patient not taking: Reported on 10/27/2023) 20 tablet 0   hydrocortisone  2.5 % cream Apply topically to affected areas up to 2 (two) times daily as needed. 30 g 11   ketoconazole  (NIZORAL ) 2 % cream Apply topically daily. 60 g 11   lisinopril -hydrochlorothiazide  (ZESTORETIC ) 10-12.5 MG tablet Take 1 tablet by mouth daily. 90 tablet 3   pravastatin  (PRAVACHOL ) 80 MG  tablet Take 1 tablet (80 mg total) by mouth every evening. 90 tablet 3   tacrolimus  (PROTOPIC ) 0.1 % ointment Apply 1 Application topically 2 (two) times a day. 100 g 6   No current facility-administered medications on file prior to visit.   "

## 2024-01-27 NOTE — Telephone Encounter (Signed)
 Noted

## 2024-01-27 NOTE — Telephone Encounter (Signed)
 Patient has virtual appointment scheduled to discuss rash.

## 2024-02-05 ENCOUNTER — Other Ambulatory Visit: Payer: Self-pay

## 2024-02-16 ENCOUNTER — Telehealth: Payer: Self-pay

## 2024-02-16 ENCOUNTER — Other Ambulatory Visit: Payer: Self-pay

## 2024-02-16 NOTE — Telephone Encounter (Signed)
 Patient has been informed.

## 2024-02-16 NOTE — Telephone Encounter (Signed)
 Copied from CRM [id number]. Topic: General - Other >> Feb 16, 2024  9:14 AM Avram MATSU wrote: Reason for CRM: patient had a nurse visit yesterday and stated she takes multivitamins 50 plus for her and she takes 2 chews a day instead of 3 as instructed. Please advise through mychart if patient should continue to take vitamins.   [phone]

## 2024-02-16 NOTE — Telephone Encounter (Signed)
 Patient was called and she states that she has been taking 2 of the multivitamins 50 plus, she is wanting to know if this is ok.

## 2024-02-16 NOTE — Telephone Encounter (Signed)
 Yes she can continue to take it.

## 2024-03-28 ENCOUNTER — Ambulatory Visit: Admitting: Family Medicine

## 2024-09-20 ENCOUNTER — Ambulatory Visit
